# Patient Record
Sex: Female | Born: 1970 | Race: White | Hispanic: No | Smoking: Former smoker
Health system: Southern US, Community
[De-identification: ages and names within clinical notes are randomized; demographics above are authoritative.]

## PROBLEM LIST (undated history)

## (undated) DIAGNOSIS — F329 Major depressive disorder, single episode, unspecified: Secondary | ICD-10-CM

## (undated) DIAGNOSIS — K219 Gastro-esophageal reflux disease without esophagitis: Secondary | ICD-10-CM

## (undated) DIAGNOSIS — F419 Anxiety disorder, unspecified: Secondary | ICD-10-CM

## (undated) DIAGNOSIS — F32A Depression, unspecified: Secondary | ICD-10-CM

## (undated) HISTORY — PX: KNEE SURGERY: SHX244

## (undated) HISTORY — PX: ENDOMETRIAL ABLATION: SHX621

## (undated) HISTORY — PX: TUBAL LIGATION: SHX77

## (undated) HISTORY — PX: TONSILLECTOMY: SUR1361

---

## 2005-05-15 ENCOUNTER — Emergency Department: Payer: Self-pay | Admitting: Unknown Physician Specialty

## 2007-01-11 ENCOUNTER — Emergency Department: Payer: Self-pay | Admitting: Unknown Physician Specialty

## 2007-01-28 ENCOUNTER — Emergency Department: Payer: Self-pay

## 2007-04-19 ENCOUNTER — Emergency Department: Payer: Self-pay | Admitting: Emergency Medicine

## 2007-07-22 ENCOUNTER — Emergency Department: Payer: Self-pay | Admitting: Internal Medicine

## 2008-05-01 ENCOUNTER — Ambulatory Visit: Payer: Self-pay

## 2008-05-27 ENCOUNTER — Emergency Department: Payer: Self-pay | Admitting: Emergency Medicine

## 2008-05-30 ENCOUNTER — Encounter: Payer: Self-pay | Admitting: Orthopedic Surgery

## 2008-06-20 ENCOUNTER — Ambulatory Visit: Payer: Self-pay

## 2008-06-22 ENCOUNTER — Encounter: Payer: Self-pay | Admitting: Orthopedic Surgery

## 2008-07-23 ENCOUNTER — Ambulatory Visit: Payer: Self-pay

## 2009-02-20 DIAGNOSIS — F418 Other specified anxiety disorders: Secondary | ICD-10-CM | POA: Insufficient documentation

## 2009-05-19 ENCOUNTER — Emergency Department (HOSPITAL_COMMUNITY): Admission: EM | Admit: 2009-05-19 | Discharge: 2009-05-19 | Payer: Self-pay | Admitting: Emergency Medicine

## 2010-09-24 LAB — CBC
MCV: 86.8 fL (ref 78.0–100.0)
RBC: 4.38 MIL/uL (ref 3.87–5.11)
WBC: 8.6 10*3/uL (ref 4.0–10.5)

## 2010-09-24 LAB — DIFFERENTIAL
Eosinophils Absolute: 0.2 10*3/uL (ref 0.0–0.7)
Lymphs Abs: 1.5 10*3/uL (ref 0.7–4.0)
Monocytes Relative: 8 % (ref 3–12)
Neutro Abs: 6.1 10*3/uL (ref 1.7–7.7)
Neutrophils Relative %: 71 % (ref 43–77)

## 2010-09-24 LAB — BRAIN NATRIURETIC PEPTIDE: Pro B Natriuretic peptide (BNP): 34 pg/mL (ref 0.0–100.0)

## 2010-09-24 LAB — POCT CARDIAC MARKERS
CKMB, poc: <1 ng/mL — ABNORMAL LOW (ref 1.0–8.0)
Myoglobin, poc: 34.1 ng/mL (ref 12–200)
Troponin i, poc: <0.05 ng/mL (ref 0.00–0.09)

## 2010-09-24 LAB — BASIC METABOLIC PANEL
Chloride: 104 mEq/L (ref 96–112)
Creatinine, Ser: 0.61 mg/dL (ref 0.4–1.2)
GFR calc Af Amer: >60 mL/min (ref >60)
Potassium: 4.6 mEq/L (ref 3.5–5.1)

## 2010-09-24 LAB — URINALYSIS, ROUTINE W REFLEX MICROSCOPIC
Glucose, UA: NEGATIVE mg/dL
Protein, ur: NEGATIVE mg/dL
Specific Gravity, Urine: 1.017 (ref 1.005–1.030)
pH: 6 (ref 5.0–8.0)

## 2010-10-14 ENCOUNTER — Ambulatory Visit: Payer: Self-pay

## 2011-10-29 ENCOUNTER — Emergency Department: Payer: Self-pay | Admitting: Emergency Medicine

## 2011-11-11 ENCOUNTER — Ambulatory Visit: Payer: Self-pay

## 2011-11-16 ENCOUNTER — Ambulatory Visit: Payer: Self-pay

## 2012-06-22 HISTORY — PX: BREAST BIOPSY: SHX20

## 2014-01-16 ENCOUNTER — Inpatient Hospital Stay: Payer: Self-pay | Admitting: Internal Medicine

## 2014-01-16 LAB — COMPREHENSIVE METABOLIC PANEL
ALK PHOS: 74 U/L
ALT: 43 U/L
AST: 26 U/L (ref 15–37)
Albumin: 3.5 g/dL (ref 3.4–5.0)
Anion Gap: 9 (ref 7–16)
BUN: 11 mg/dL (ref 7–18)
Bilirubin,Total: 0.4 mg/dL (ref 0.2–1.0)
CALCIUM: 9.6 mg/dL (ref 8.5–10.1)
CHLORIDE: 109 mmol/L — AB (ref 98–107)
CREATININE: 0.69 mg/dL (ref 0.60–1.30)
Co2: 23 mmol/L (ref 21–32)
EGFR (African American): >60
EGFR (Non-African Amer.): >60
Glucose: 102 mg/dL — ABNORMAL HIGH (ref 65–99)
OSMOLALITY: 281 (ref 275–301)
POTASSIUM: 3.7 mmol/L (ref 3.5–5.1)
Sodium: 141 mmol/L (ref 136–145)
Total Protein: 7.6 g/dL (ref 6.4–8.2)

## 2014-01-16 LAB — CBC WITH DIFFERENTIAL/PLATELET
BASOS ABS: 0.1 10*3/uL (ref 0.0–0.1)
Basophil %: 0.8 %
EOS PCT: 1.2 %
Eosinophil #: 0.1 10*3/uL (ref 0.0–0.7)
HCT: 43.8 % (ref 35.0–47.0)
HGB: 14.4 g/dL (ref 12.0–16.0)
Lymphocyte #: 3 10*3/uL (ref 1.0–3.6)
Lymphocyte %: 24.1 %
MCH: 29.1 pg (ref 26.0–34.0)
MCHC: 32.9 g/dL (ref 32.0–36.0)
MCV: 89 fL (ref 80–100)
MONOS PCT: 6.4 %
Monocyte #: 0.8 x10 3/mm (ref 0.2–0.9)
NEUTROS ABS: 8.4 10*3/uL — AB (ref 1.4–6.5)
Neutrophil %: 67.5 %
PLATELETS: 254 10*3/uL (ref 150–440)
RBC: 4.95 10*6/uL (ref 3.80–5.20)
RDW: 15 % — ABNORMAL HIGH (ref 11.5–14.5)
WBC: 12.4 10*3/uL — ABNORMAL HIGH (ref 3.6–11.0)

## 2014-01-17 LAB — BASIC METABOLIC PANEL
ANION GAP: 9 (ref 7–16)
BUN: 12 mg/dL (ref 7–18)
CALCIUM: 8.9 mg/dL (ref 8.5–10.1)
CO2: 22 mmol/L (ref 21–32)
CREATININE: 0.84 mg/dL (ref 0.60–1.30)
Chloride: 107 mmol/L (ref 98–107)
Glucose: 166 mg/dL — ABNORMAL HIGH (ref 65–99)
OSMOLALITY: 279 (ref 275–301)
POTASSIUM: 3.9 mmol/L (ref 3.5–5.1)
Sodium: 138 mmol/L (ref 136–145)

## 2014-01-17 LAB — CBC WITH DIFFERENTIAL/PLATELET
BASOS PCT: 0.1 %
Basophil #: 0 10*3/uL (ref 0.0–0.1)
Eosinophil #: 0 10*3/uL (ref 0.0–0.7)
Eosinophil %: 0 %
HCT: 40.7 % (ref 35.0–47.0)
HGB: 13.6 g/dL (ref 12.0–16.0)
LYMPHS PCT: 7.6 %
Lymphocyte #: 1.1 10*3/uL (ref 1.0–3.6)
MCH: 29.3 pg (ref 26.0–34.0)
MCHC: 33.3 g/dL (ref 32.0–36.0)
MCV: 88 fL (ref 80–100)
MONO ABS: 0.2 x10 3/mm (ref 0.2–0.9)
MONOS PCT: 1.6 %
NEUTROS ABS: 13 10*3/uL — AB (ref 1.4–6.5)
Neutrophil %: 90.7 %
Platelet: 229 10*3/uL (ref 150–440)
RBC: 4.63 10*6/uL (ref 3.80–5.20)
RDW: 14.6 % — AB (ref 11.5–14.5)
WBC: 14.4 10*3/uL — AB (ref 3.6–11.0)

## 2014-02-03 ENCOUNTER — Emergency Department: Payer: Self-pay | Admitting: Emergency Medicine

## 2014-02-03 LAB — CBC WITH DIFFERENTIAL/PLATELET
Basophil #: 0.2 10*3/uL — ABNORMAL HIGH (ref 0.0–0.1)
Basophil %: 1.1 %
Eosinophil #: 0.1 10*3/uL (ref 0.0–0.7)
Eosinophil %: 0.5 %
HCT: 45.4 % (ref 35.0–47.0)
HGB: 14.5 g/dL (ref 12.0–16.0)
Lymphocyte #: 2.8 10*3/uL (ref 1.0–3.6)
Lymphocyte %: 17.5 %
MCH: 28.8 pg (ref 26.0–34.0)
MCHC: 31.9 g/dL — ABNORMAL LOW (ref 32.0–36.0)
MCV: 90 fL (ref 80–100)
Monocyte #: 1.1 x10 3/mm — ABNORMAL HIGH (ref 0.2–0.9)
Monocyte %: 6.9 %
Neutrophil #: 11.8 10*3/uL — ABNORMAL HIGH (ref 1.4–6.5)
Neutrophil %: 74 %
Platelet: 255 10*3/uL (ref 150–440)
RBC: 5.02 10*6/uL (ref 3.80–5.20)
RDW: 15.9 % — ABNORMAL HIGH (ref 11.5–14.5)
WBC: 16 10*3/uL — ABNORMAL HIGH (ref 3.6–11.0)

## 2014-02-03 LAB — COMPREHENSIVE METABOLIC PANEL
ANION GAP: 9 (ref 7–16)
Albumin: 3.1 g/dL — ABNORMAL LOW (ref 3.4–5.0)
Alkaline Phosphatase: 66 U/L
BUN: 24 mg/dL — AB (ref 7–18)
Bilirubin,Total: 0.2 mg/dL (ref 0.2–1.0)
CALCIUM: 8.6 mg/dL (ref 8.5–10.1)
CO2: 25 mmol/L (ref 21–32)
Chloride: 107 mmol/L (ref 98–107)
Creatinine: 0.81 mg/dL (ref 0.60–1.30)
Glucose: 115 mg/dL — ABNORMAL HIGH (ref 65–99)
Osmolality: 286 (ref 275–301)
POTASSIUM: 4.4 mmol/L (ref 3.5–5.1)
SGOT(AST): 17 U/L (ref 15–37)
SGPT (ALT): 24 U/L
SODIUM: 141 mmol/L (ref 136–145)
Total Protein: 7.5 g/dL (ref 6.4–8.2)

## 2014-02-03 LAB — TROPONIN I

## 2014-02-15 ENCOUNTER — Ambulatory Visit: Payer: Self-pay | Admitting: Family Medicine

## 2014-04-24 ENCOUNTER — Encounter: Payer: Self-pay | Admitting: Family Medicine

## 2014-05-22 ENCOUNTER — Encounter: Payer: Self-pay | Admitting: Family Medicine

## 2014-06-22 ENCOUNTER — Encounter: Payer: Self-pay | Admitting: Family Medicine

## 2014-07-23 ENCOUNTER — Encounter: Payer: Self-pay | Admitting: Family Medicine

## 2014-08-21 ENCOUNTER — Encounter
Admit: 2014-08-21 | Disposition: A | Discharge status: Home / Self Care | Payer: Self-pay | Attending: Family Medicine | Admitting: Family Medicine

## 2014-08-29 ENCOUNTER — Ambulatory Visit: Payer: Self-pay

## 2014-09-21 ENCOUNTER — Encounter
Admit: 2014-09-21 | Disposition: A | Discharge status: Home / Self Care | Payer: Self-pay | Attending: Family Medicine | Admitting: Family Medicine

## 2014-10-13 NOTE — H&P (Signed)
PATIENT NAME:  Brittany Cobb, Brittany Cobb MR#:  [id number] DATE OF BIRTH:  28-Jun-1970  DATE OF ADMISSION:  01/16/2014  PRIMARY CARE PHYSICIAN: Dr. Kellie Moor.   CHIEF COMPLAINT: Shortness of breath.   HISTORY OF PRESENT ILLNESS: A 44 year old female with past history, and remote, has asthma, but not requiring any medications or inhalers since many years. Today, in the morning, was cleaning her bathroom with Clorox, and after cleaning it, having some fumes, she started having severe shortness of breath. She tried to sit under the fan to get some fresh air, but it did not help much, and she was feeling like, as if, she would pass out, so finally called the EMS, came over here. Had wheezing, given respiratory treatment and oxygen supplementation, and she felt better after that, and the hospitalist service was contacted for further evaluation and management of this patient's condition.   REVIEW OF SYSTEMS:  CONSTITUTIONAL: Negative for fever, fatigue, weakness, pain or weight loss.  EYES: No blurring, double vision, discharge or redness.  EARS, NOSE, THROAT: No tinnitus, ear pain or hearing loss.  RESPIRATORY: No cough, but had wheezing and shortness of breath today. No sputum production.  CARDIOVASCULAR: No chest pain, orthopnea, edema, arrhythmia, palpitations.  GASTROINTESTINAL: No nausea, vomiting, diarrhea, or abdominal pain. GENITOURINARY: No dysuria, hematuria, or increased frequency.  ENDOCRINE: No heat or cold intolerance,. SKIN: No acne, rashes, or lesions. MUSCULOSKELETAL: No pain or swelling in the joints.  NEUROLOGIC: No numbness, weakness, tremor or vertigo.  PSYCHIATRIC: No anxiety, insomnia, or bipolar disorder.   PAST MEDICAL HISTORY: Anxiety disorder, chronic back pain, and gastroesophageal reflux disease.   PAST SURGICAL HISTORY: 1. Endometrial ablation surgery.  2. Tonsillectomy.  3. Tubal ligation.  4. Left knee arthroscopic procedure.   SOCIAL HISTORY: She smoked half-a-pack of  cigarettes every day for almost 25 to 30 years, since her age of 67 years. She denies doing any illegal drug use and she drinks alcohol very rarely. She is a [employer] fund[employer].   FAMILY HISTORY: Both her grandparents had heart issues, and on her mother's side, her mother and grandmother both had diabetes and high cholesterol.   HOME MEDICATIONS:  1. Omeprazole 40 mg oral once a day.  2. Norco tablet 325/10 mg every 6 hours as needed for pain.  3. Alprazolam 0.5 mg oral tablet every 6 hours and at bedtime as needed for anxiety.   PHYSICAL EXAMINATION:  VITAL SIGNS: In the ER, temperature 99.3, pulse is 107 on presentation, currently came down to 90, blood pressure 107/86 now came up to 138/67, pulse oximetry 98% to 99% on room air.  GENERAL: The patient is fully alert and oriented to time, place, and person. She is obese, does not appear in any acute distress.  HEENT: Head and neck atraumatic. Conjunctivae pink. Oral mucosa moist.  NECK: Supple. No JVD.  RESPIRATORY: Bilateral equal air entry. Some rhonchi present.  CARDIOVASCULAR: S1, S2 present. No chest tenderness, regular rhythm. No murmur.  ABDOMEN: Soft, nontender, bowel sounds present. No guarding, no rebound.  SKIN: No rashes. EXTREMITIES: Legs, no edema. NEUROLOGICAL: Power 5/5, no tremor or rigidity. Follows commands. Moves all 4 limbs.  PSYCHIATRIC: Does not appear in any acute psychiatric illness at this time.   IMPORTANT LABORATORY RESULTS: Glucose 102, BUN 11, creatinine 0.69, sodium 141, potassium is 3.7, chloride is 109, CO2 is 23, and calcium is 9.6. Total protein is 7.6, albumin 3.5, bilirubin 0.4, alkaline phosphatase 74, SGOT 26, SGPT 43. WBC 12.4, hemoglobin 14.4, platelet  count 254,000, MCV is 89.   On ABG, pH is 7.42, pCO2 is 35, pO2 is 71, and FiO2 is 28.   X-ray of the chest, PA and lateral, no acute cardiopulmonary abnormality.   ASSESSMENT AND PLAN: A 44 year old female with having remote history of asthma and  current smoker, came to the hospital after having severe shortness of breath that occurred after cleaning the bathroom with Clorox, having those fumes inhaled.   1. Acute asthma exacerbation. We will give her IV Solu-Medrol and give her a DuoNeb nebulizer treatment. As she did not have any episode since a long time, she does not need any maintenance therapy at this time.  2. Headache. Has complained of headache after having this episode. We will give Fioricet tablet once. 3. Complaint of nausea after this episode. We will give Zofran once.  4. Anxiety episodes. She is on Xanax, as needed basis, at home. We will continue the same.  5. Gastroesophageal reflux disease, taking omeprazole. We will continue pantoprazole here in the hospital.  6. Smoking, tobacco abuse. Smoking cessation counseling was done for 4 minutes. She agreed to try quitting smoking. She does not need a nicotine patch at this time.   TOTAL TIME SPENT ON ADMISSION: 50 minutes.    ____________________________ Hope PigeonVaibhavkumar G. Elisabeth PigeonVachhani, MD [id number] D: 01/16/2014 16:48:25 ET T: 01/16/2014 17:05:40 ET JOB#: [id number]  cc: Hope PigeonVaibhavkumar G. Elisabeth PigeonVachhani, MD, <Dictator> Kellie MoorJohn Holt, M.D. Heath GoldVAIBHAVKUMAR [facility] MD ELECTRONICALLY SIGNED 01/29/2014 8:15

## 2014-10-13 NOTE — Discharge Summary (Signed)
Dates of Admission and Diagnosis:  Date of Admission 16-Jan-2014   Date of Discharge 19-Jan-2014   Admitting Diagnosis asthma exacerbation   Final Diagnosis asthma exacerbation - after inhaling fumes from toilet cleaner Anxiety Migrain headache nausea Smoking    Chief Complaint/History of Present Illness A 44 year old female with past history, and remote, has asthma, but not requiring any medications or inhalers since many years. Today, in the morning, was cleaning her bathroom with Clorox, and after cleaning it, having some fumes, she started having severe shortness of breath. She tried to sit under the fan to get some fresh air, but it did not help much, and she was feeling like, as if, she would pass out, so finally called the EMS, came over here. Had wheezing, given respiratory treatment and oxygen supplementation, and she felt better after that, and the hospitalist service was contacted for further evaluation and management of this patient's condition.   Allergies:  Bactrim: Hives  Hepatic:  28-Jul-15 13:55   Bilirubin, Total 0.4  Alkaline Phosphatase 74 (46-116 NOTE: New Reference Range 01/09/14)  SGPT (ALT) 43 (14-63 NOTE: New Reference Range 01/09/14)  SGOT (AST) 26  Total Protein, Serum 7.6  Albumin, Serum 3.5  Lab:  28-Jul-15 14:15   pH (ABG) 7.42 (7.35-7.45 NOTE: New Reference Range 01/13/14)  PCO2 35 (32-48 NOTE: New Reference Range 01/13/14)  PO2  71 (83-108 NOTE: New Reference Range 01/13/14)  FiO2 28  Base Excess -1.3 (-2.0-3.0 NOTE: New Reference Range 01/13/14)  HCO3 22.7 (21-28 NOTE: New Reference Range 01/13/14)  O2 Saturation 97.1  O2 Device [place]  Specimen Site (ABG) LT RADIAL  Specimen Type (ABG) ARTERIAL  Patient Temp (ABG) 37.0 (Result(s) reported on 16 Jan 2014 at 02:26PM.)  Cardiology:  28-Jul-15 13:55   Ventricular Rate 110  Atrial Rate 110  P-R Interval 130  QRS Duration 86  QT 352  QTc 476  P Axis 58  R Axis 33  T Axis 41  ECG  interpretation Sinus tachycardia Otherwise normal ECG When compared with ECG of 27-May-2008 06:23, No significant change was found ----------unconfirmed---------- Confirmed by OVERREAD, NOT ([id number]), editor PEARSON, BARBARA ([id number]) on 01/17/2014 12:59:31 PM  Routine Chem:  28-Jul-15 13:55   Glucose, Serum  102  BUN 11  Creatinine (comp) 0.69  Sodium, Serum 141  Potassium, Serum 3.7  Chloride, Serum  109  CO2, Serum 23  Calcium (Total), Serum 9.6  Anion Gap 9  Osmolality (calc) 281  eGFR (African American) >60  eGFR (Non-African American) >60 (eGFR values <59m/min/1.73 m2 may be an indication of chronic kidney disease (CKD). Calculated eGFR is useful in patients with stable renal function. The eGFR calculation will not be reliable in acutely ill patients when serum creatinine is changing rapidly. It is not useful in  patients on dialysis. The eGFR calculation may not be applicable to patients at the low and high extremes of body sizes, pregnant women, and vegetarians.)  29-Jul-15 04:54   Glucose, Serum  166  BUN 12  Creatinine (comp) 0.84  Sodium, Serum 138  Potassium, Serum 3.9  Chloride, Serum 107  CO2, Serum 22  Calcium (Total), Serum 8.9  Anion Gap 9  Osmolality (calc) 279  eGFR (African American) >60  eGFR (Non-African American) >60 (eGFR values <628mmin/1.73 m2 may be an indication of chronic kidney disease (CKD). Calculated eGFR is useful in patients with stable renal function. The eGFR calculation will not be reliable in acutely ill patients when serum creatinine is changing rapidly. It is not useful  in  patients on dialysis. The eGFR calculation may not be applicable to patients at the low and high extremes of body sizes, pregnant women, and vegetarians.)  Routine Hem:  28-Jul-15 13:55   WBC (CBC)  12.4  RBC (CBC) 4.95  Hemoglobin (CBC) 14.4  Hematocrit (CBC) 43.8  Platelet Count (CBC) 254  MCV 89  MCH 29.1  MCHC 32.9  RDW  15.0  Neutrophil % 67.5   Lymphocyte % 24.1  Monocyte % 6.4  Eosinophil % 1.2  Basophil % 0.8  Neutrophil #  8.4  Lymphocyte # 3.0  Monocyte # 0.8  Eosinophil # 0.1  Basophil # 0.1 (Result(s) reported on 16 Jan 2014 at 03:24PM.)  29-Jul-15 04:54   WBC (CBC)  14.4  RBC (CBC) 4.63  Hemoglobin (CBC) 13.6  Hematocrit (CBC) 40.7  Platelet Count (CBC) 229  MCV 88  MCH 29.3  MCHC 33.3  RDW  14.6  Neutrophil % 90.7  Lymphocyte % 7.6  Monocyte % 1.6  Eosinophil % 0.0  Basophil % 0.1  Neutrophil #  13.0  Lymphocyte # 1.1  Monocyte # 0.2  Eosinophil # 0.0  Basophil # 0.0 (Result(s) reported on 17 Jan 2014 at 05:55AM.)   PERTINENT RADIOLOGY STUDIES: XRay:    28-Jul-15 14:08, Chest PA and Lateral  Chest PA and Lateral   REASON FOR EXAM:    sob  COMMENTS:   May transport without cardiac monitor    PROCEDURE: DXR - DXR CHEST PA (OR AP) AND LATERAL  - Jan 16 2014  2:08PM     CLINICAL DATA:  Shortness of breath.    EXAM:  CHEST  2 VIEW    COMPARISON:  None.    FINDINGS:  The heart size and mediastinal contours are within normal limits.  Both lungs are clear. No pneumothorax or pleural effusion is noted.  The visualized skeletal structures are unremarkable.     IMPRESSION:  No acute cardiopulmonary abnormality seen.      Electronically Signed    By: Sabino Dick M.D.    On: 01/16/2014 14:12         Verified By: Marveen Reeks, M.D.,    30-Jul-15 11:22, Chest PA and Lateral  Chest PA and Lateral   REASON FOR EXAM:    compare pneumonitis  COMMENTS:       PROCEDURE: DXR - DXR CHEST PA (OR AP) AND LATERAL  - Jan 18 2014 11:22AM     CLINICAL DATA:  Pneumonitis    EXAM:  CHEST  2 VIEW    COMPARISON:  01/17/2014    FINDINGS:  Cardiomediastinal silhouette is stable. No segmental infiltrate or  pulmonary edema. Central mild bronchitic changes. Bony thorax is  unremarkable.     IMPRESSION:  No segmental infiltrate or pulmonary edema. Central mild  bronchitic  changes.      Electronically Signed    By: Lahoma Crocker M.D.    On: 01/18/2014 11:28         Verified By: Ephraim Hamburger, M.D.,  LabUnknown:    28-Jul-15 14:08, Chest PA and Lateral  PACS Image     30-Jul-15 11:22, Chest PA and Lateral  PACS Image    Pertinent Past History:  Pertinent Past History Anxiety disorder, chronic back pain, and gastroesophageal reflux disease.   Hospital Course:  Hospital Course A 44 year old female with having remote history of asthma and current smoker, came to the hospital after having severe shortness of breath that occurred after cleaning the bathroom  with Clorox, having those fumes inhaled.   1. Acute asthma exacerbation. IV Solu-Medrol , DuoNeb nebulizer treatment. does not need any maintenance therapy at home.         added montelukast. humidify oxygen- encouraged to ambulate as she is just staying in bed.        added fluticasone inhaler and swiched to oral steroid- advised to stay off from work for 4-5 days. d/c home today after ambulation. 2. Headache. Has complained of headache after having this episode. given fioricet, not helped much.     give Magnesium IV, Pehnergan, benadryl. give ambien at night - had 3-4 hrs sleep and some relief in headache. 3. Complaint of nausea after this episode. zofran+ phenergan.  4. Anxiety episodes. on Xanax, as needed basis, at home. continue the same.  5. Gastroesophageal reflux disease, taking omeprazole. continue pantoprazole here in the hospital.  6. Smoking, tobacco abuse. Smoking cessation counseling was done for 4 minutes. She agreed to try quitting smoking. She does not need a nicotine patch at this time.   Condition on Discharge Stable   Code Status:  Code Status Full Code   DISCHARGE INSTRUCTIONS HOME MEDS:  Medication Reconciliation: Patient's Home Medications at Discharge:     Medication Instructions  omeprazole 40 mg oral delayed release capsule  1 cap(s) orally once a day    norco 325 mg-10 mg oral tablet  1 tab(s) orally every 6 hours, As Needed    alprazolam 0.25 mg oral tablet  1 tab(s) orally every 8 hours, As Needed, anxiety , As needed, anxiety   prednisone 10 mg oral tablet  Start at 60 mg and taper by 10 mg daily until complete   acetaminophen/butalbital/caffeine 325 mg-50 mg-40 mg oral tablet  1 tab(s) orally every 8 hours, As Needed, headache , As needed, headache   promethazine 12.5 mg oral tablet  1 tab(s) orally every 8 hours, As Needed - for Nausea, Vomiting   zolpidem 5 mg oral tablet  1 tab(s) orally once a day (at bedtime), As Needed for sleep   guaifenesin  10 milliliter(s) orally every 6 hours x 5 days, As Needed, cough , As needed, cough   benzocaine-menthol topical  1 lozenge orally every 2 hours, As Needed, cough, sore throat , As needed, cough, sore throat   montelukast 10 mg oral tablet  1 tab(s) orally once a day   levofloxacin 500 mg oral tablet  1 tab(s) orally once a day x 4 days   fluticasone cfc free 110 mcg/inh inhalation aerosol  1 puff(s) inhaled 2 times a day   nicotine 21 mg/24 hr transdermal film, extended release  1 patch transdermal once a day   albuterol cfc free 90 mcg/inh inhalation aerosol  2 puff(s) inhaled 4 times a day, As Needed - for Shortness of Breath   albuterol 1.25 mg/3 ml (0.042%) inhalation solution  3 milliliter(s) inhaled 3 times a day, As Needed - for Shortness of Breath     Physician's Instructions:  Diet Low Sodium  Low Fat, Low Cholesterol   Activity Limitations No exertional activity  for 4-5 days   Return to Work Not Applicable   Time frame for Follow Up Appointment 1-2 weeks  PMD     BURNS, HARRIET(Family Physician): [phone]  Electronic Signatures: Vaughan Basta (MD)  (Signed 31-Jul-15 14:39)  Authored: ADMISSION DATE AND DIAGNOSIS, CHIEF COMPLAINT/HPI, Allergies, PERTINENT LABS, PERTINENT RADIOLOGY STUDIES, PERTINENT PAST HISTORY, HOSPITAL COURSE, DISCHARGE INSTRUCTIONS HOME  MEDS, PATIENT INSTRUCTIONS, Follow Up  Physician   Last Updated: 31-Jul-15 14:39 by Vaughan Basta (MD)

## 2014-10-22 ENCOUNTER — Ambulatory Visit: Payer: Self-pay

## 2014-10-24 ENCOUNTER — Ambulatory Visit: Payer: Self-pay

## 2014-10-26 ENCOUNTER — Ambulatory Visit: Payer: Self-pay

## 2014-10-29 ENCOUNTER — Ambulatory Visit: Payer: Self-pay

## 2014-10-31 ENCOUNTER — Ambulatory Visit: Payer: Self-pay

## 2014-11-02 ENCOUNTER — Ambulatory Visit: Payer: Self-pay

## 2014-11-05 ENCOUNTER — Ambulatory Visit: Payer: Self-pay

## 2014-11-07 ENCOUNTER — Ambulatory Visit: Payer: Self-pay

## 2014-11-09 ENCOUNTER — Ambulatory Visit: Payer: Self-pay

## 2014-11-12 ENCOUNTER — Ambulatory Visit: Payer: Self-pay

## 2014-11-14 ENCOUNTER — Ambulatory Visit: Payer: Self-pay

## 2014-11-16 ENCOUNTER — Ambulatory Visit: Payer: Self-pay

## 2014-11-21 ENCOUNTER — Ambulatory Visit: Payer: Self-pay

## 2014-11-23 ENCOUNTER — Ambulatory Visit: Payer: Self-pay

## 2014-11-26 ENCOUNTER — Ambulatory Visit: Payer: Self-pay

## 2014-11-28 ENCOUNTER — Ambulatory Visit: Payer: Self-pay

## 2014-11-30 ENCOUNTER — Ambulatory Visit: Payer: Self-pay

## 2014-12-03 ENCOUNTER — Ambulatory Visit: Payer: Self-pay

## 2014-12-05 ENCOUNTER — Ambulatory Visit: Payer: Self-pay

## 2014-12-07 ENCOUNTER — Ambulatory Visit: Payer: Self-pay

## 2014-12-10 ENCOUNTER — Ambulatory Visit: Payer: Self-pay

## 2014-12-12 ENCOUNTER — Ambulatory Visit: Payer: Self-pay

## 2014-12-14 ENCOUNTER — Ambulatory Visit: Payer: Self-pay

## 2014-12-17 ENCOUNTER — Ambulatory Visit: Payer: Self-pay

## 2014-12-19 ENCOUNTER — Ambulatory Visit: Payer: Self-pay

## 2014-12-21 ENCOUNTER — Ambulatory Visit: Payer: Self-pay

## 2015-03-26 ENCOUNTER — Emergency Department
Admission: EM | Admit: 2015-03-26 | Discharge: 2015-03-26 | Disposition: A | Discharge status: Home / Self Care | Payer: Self-pay | Attending: Emergency Medicine | Admitting: Emergency Medicine

## 2015-03-26 ENCOUNTER — Emergency Department: Payer: BLUE CROSS/BLUE SHIELD

## 2015-03-26 ENCOUNTER — Encounter: Payer: Self-pay | Admitting: Intensive Care

## 2015-03-26 DIAGNOSIS — Z720 Tobacco use: Secondary | ICD-10-CM | POA: Insufficient documentation

## 2015-03-26 DIAGNOSIS — S9001XD Contusion of right ankle, subsequent encounter: Secondary | ICD-10-CM | POA: Insufficient documentation

## 2015-03-26 DIAGNOSIS — S9031XD Contusion of right foot, subsequent encounter: Secondary | ICD-10-CM | POA: Insufficient documentation

## 2015-03-26 DIAGNOSIS — W5512XD Struck by horse, subsequent encounter: Secondary | ICD-10-CM | POA: Insufficient documentation

## 2015-03-26 HISTORY — DX: Major depressive disorder, single episode, unspecified: F32.9

## 2015-03-26 HISTORY — DX: Anxiety disorder, unspecified: F41.9

## 2015-03-26 HISTORY — DX: Depression, unspecified: F32.A

## 2015-03-26 HISTORY — DX: Gastro-esophageal reflux disease without esophagitis: K21.9

## 2015-03-26 MED ORDER — NAPROXEN 500 MG PO TABS
500.0000 mg | ORAL_TABLET | Freq: Once | ORAL | Status: AC
  Administered 2015-03-26: 500 mg via ORAL
  Filled 2015-03-26: qty 1

## 2015-03-26 NOTE — ED Provider Notes (Signed)
CSN: [id number]     Arrival date & time 03/26/15  1239 History   First MD Initiated Contact with Patient 03/26/15 1249     Chief Complaint  Patient presents with  . Foot Pain   HPI Comments: 44 year old female presents a complaining of foot pain secondary to injury that occurred 2 months ago. Patient states her horse stepped on her right ankle around that time. She was her primary care doctor who x-rayed her foot at the time of the injury. The x-ray was negative for acute fracture. The pain started again within the past 2 weeks. 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 Does have to get up and down out of the truc and this puts a lot of pressure on her feet. Taking anti-inflammatories without relief.  Patient is a 44 y.o. female presenting with lower extremity pain.  Foot Pain Associated symptoms include arthralgias, joint swelling and myalgias.    Past Medical History  Diagnosis Date  . Acid reflux   . Depression   . Anxiety    Past Surgical History  Procedure Laterality Date  . Knee surgery Left   . Tonsillectomy    . Tubal ligation    . Endometrial ablation     History reviewed. No pertinent family history. Social History  Substance Use Topics  . Smoking status: Current Every Day Smoker -- 0.50 packs/day    Types: Cigarettes  . Smokeless tobacco: Never Used  . Alcohol Use: No   OB History    No data available     Review of Systems  Musculoskeletal: Positive for myalgias, joint swelling and arthralgias.  Skin: Positive for wound.  All other systems reviewed and are negative.     Allergies  Bactrim  Home Medications   Prior to Admission medications   Not on File   BP 126/98 mmHg  Pulse 74  Temp(Src) 98.6 F (37 C) (Oral)  Resp 18  SpO2 98% Physical Exam  Constitutional: She is oriented to person, place, and time. Vital signs are normal. She appears well-developed and well-nourished. She is active.  Non-toxic appearance. She  does not have a sickly appearance. She does not appear ill.  HENT:  Head: Normocephalic and atraumatic.  Cardiovascular: Intact distal pulses.   Musculoskeletal: Normal range of motion. She exhibits tenderness.       Right ankle: She exhibits ecchymosis. She exhibits normal range of motion, no swelling, no deformity, no laceration and normal pulse. Tenderness. Head of 5th metatarsal tenderness found. No lateral malleolus and no medial malleolus tenderness found. Achilles tendon normal.       Left ankle: Normal.  Neurological: She is alert and oriented to person, place, and time.  Skin: Skin is warm and dry.  Ecchymosis noted over fifth metatarsal head  Psychiatric: She has a normal mood and affect. Her behavior is normal. Judgment and thought content normal.  Nursing note and vitals reviewed.   ED Course  Procedures (including critical care time) Labs Review Labs Reviewed - No data to display  Imaging Review Dg Foot Complete Right  03/26/2015   CLINICAL DATA:  Pt was stepped on by horse x 2 weeks ago, right foot pain  EXAM: RIGHT FOOT COMPLETE - 3+ VIEW  COMPARISON:  None.  FINDINGS: There is no evidence of fracture or dislocation. There is no evidence of arthropathy or other focal bone abnormality. Soft tissues are unremarkable.  IMPRESSION: Negative.   Electronically Signed   By: Lanora Manis  Manson Passey M.D.   On: 03/26/2015 13:33   I have personally reviewed and evaluated these images and lab results as part of my medical decision-making.   EKG Interpretation None      MDM  Pt requesting narcotic pain medication. Without acute injury this is not indicated. NCDR reveals she received a 30 day supply of Norco 10 325, #60 pills on 03/21/15 from Dr. Leonor Liv, her primary care physician. Mathews Robinsons, recommended follow up with PCP or ortho-info provided on how to contact, continue OTC meds for pain Final diagnoses:  Foot contusion, right, subsequent encounter        Luvenia Redden,  PA-C 03/26/15 1353  Myrna Blazer, MD 03/26/15 [phone]

## 2015-03-26 NOTE — Discharge Instructions (Signed)
Contusion °A contusion is a deep bruise. Contusions happen when an injury causes bleeding under the skin. Signs of bruising include pain, puffiness (swelling), and discolored skin. The contusion may turn blue, purple, or yellow. °HOME CARE  °· Put ice on the injured area. °¨ Put ice in a plastic bag. °¨ Place a towel between your skin and the bag. °¨ Leave the ice on for 15-20 minutes, 03-04 times a day. °· Only take medicine as told by your doctor. °· Rest the injured area. °· If possible, raise (elevate) the injured area to lessen puffiness. °GET HELP RIGHT AWAY IF:  °· You have more bruising or puffiness. °· You have pain that is getting worse. °· Your puffiness or pain is not helped by medicine. °MAKE SURE YOU:  °· Understand these instructions. °· Will watch your condition. °· Will get help right away if you are not doing well or get worse. °Document Released: 11/25/2007 Document Revised: 08/31/2011 Document Reviewed: 04/13/2011 °[employer]® Patient Information ©2015 [employer]. This information is not intended to replace advice given to you by your health care provider. Make sure you discuss any questions you have with your health care provider. ° °

## 2015-03-26 NOTE — ED Notes (Signed)
Patient reports her horse stepped on her R ankle about two months ago. Foot is now swelling with minimal bruising noted. Patient c/o pain when putting pressure on foot

## 2015-12-03 ENCOUNTER — Other Ambulatory Visit: Payer: Self-pay | Admitting: Family Medicine

## 2015-12-03 DIAGNOSIS — Z1231 Encounter for screening mammogram for malignant neoplasm of breast: Secondary | ICD-10-CM

## 2015-12-18 ENCOUNTER — Ambulatory Visit: Payer: BLUE CROSS/BLUE SHIELD

## 2015-12-27 ENCOUNTER — Ambulatory Visit: Payer: Self-pay

## 2016-01-13 ENCOUNTER — Ambulatory Visit: Payer: BLUE CROSS/BLUE SHIELD

## 2016-01-16 ENCOUNTER — Ambulatory Visit
Admission: RE | Admit: 2016-01-16 | Discharge: 2016-01-16 | Disposition: A | Discharge status: Home / Self Care | Payer: Self-pay | Source: Ambulatory Visit | Attending: Family Medicine | Admitting: Family Medicine

## 2016-01-16 DIAGNOSIS — Z1231 Encounter for screening mammogram for malignant neoplasm of breast: Secondary | ICD-10-CM

## 2016-02-28 DIAGNOSIS — F314 Bipolar disorder, current episode depressed, severe, without psychotic features: Secondary | ICD-10-CM | POA: Insufficient documentation

## 2016-03-02 IMAGING — DX DG CHEST 2V
2 series · 2 of 2 positions shown · non-contrast
Comparison: None.

CLINICAL DATA: Shortness of breath.

EXAM:
CHEST  2 VIEW

[chest pa]
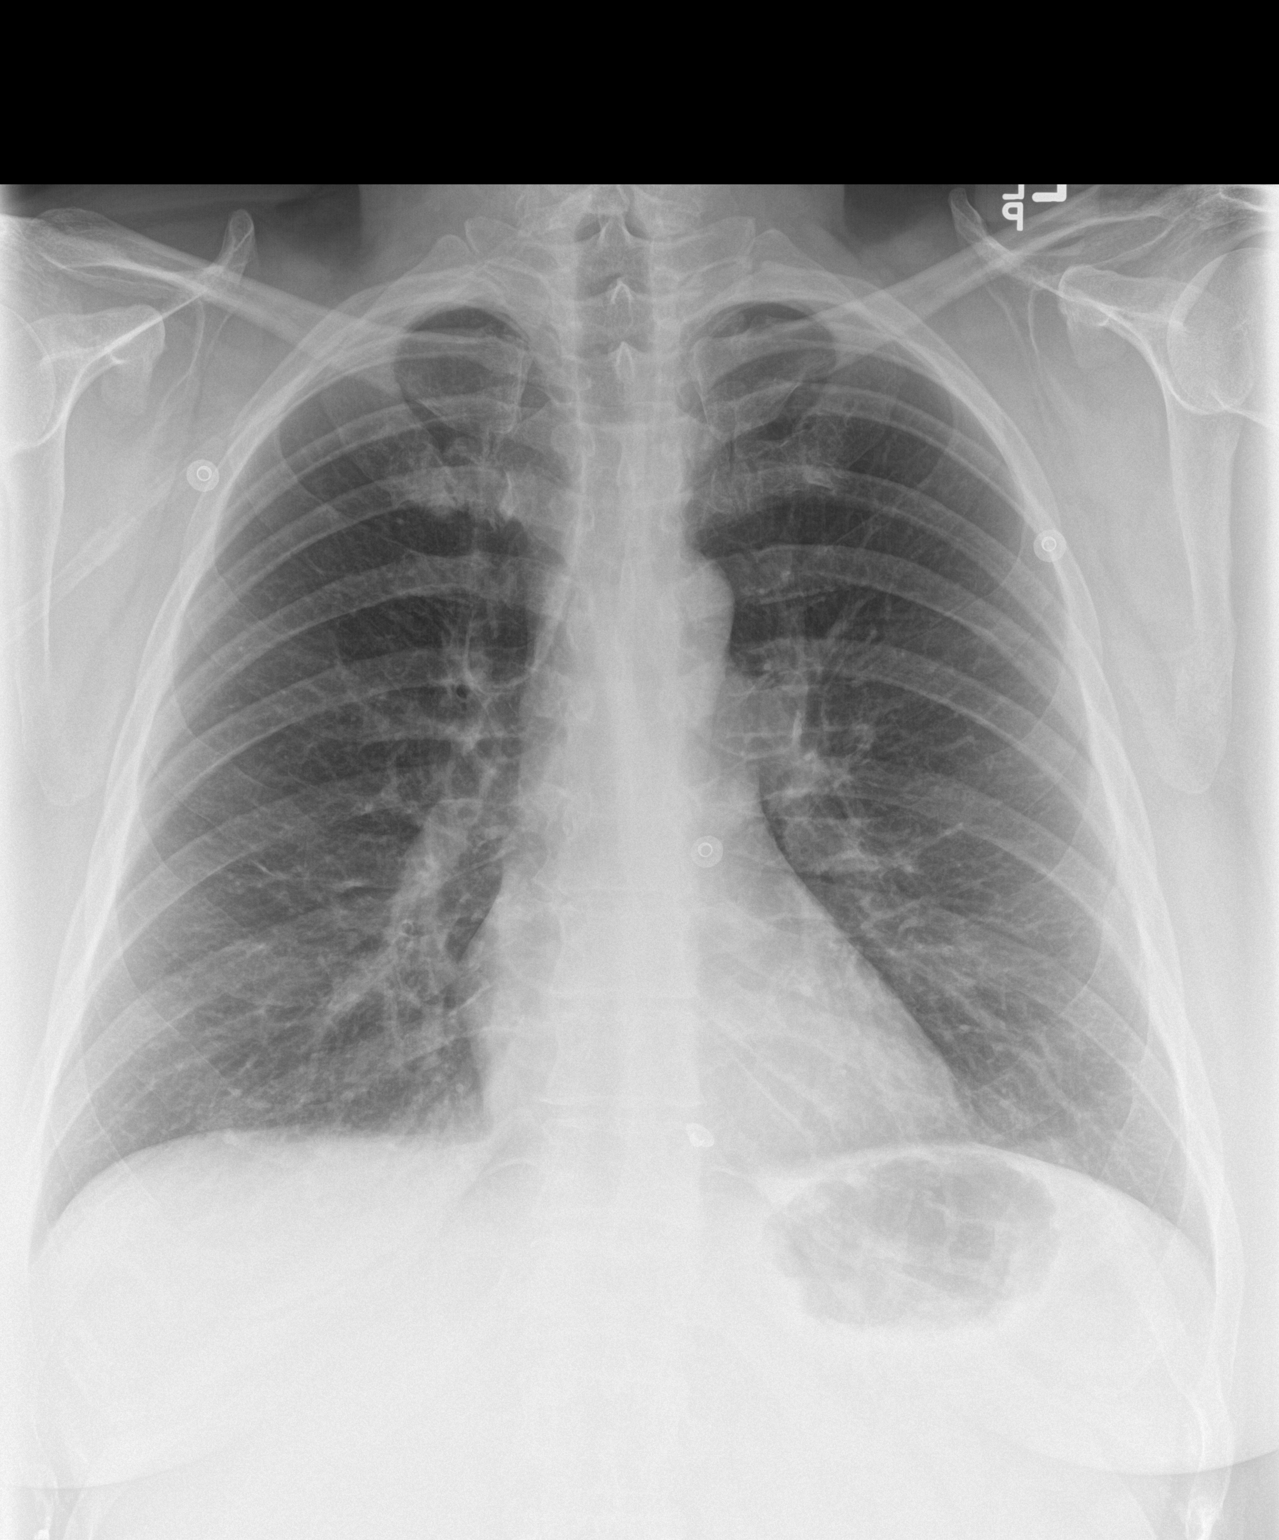

[chest lat]
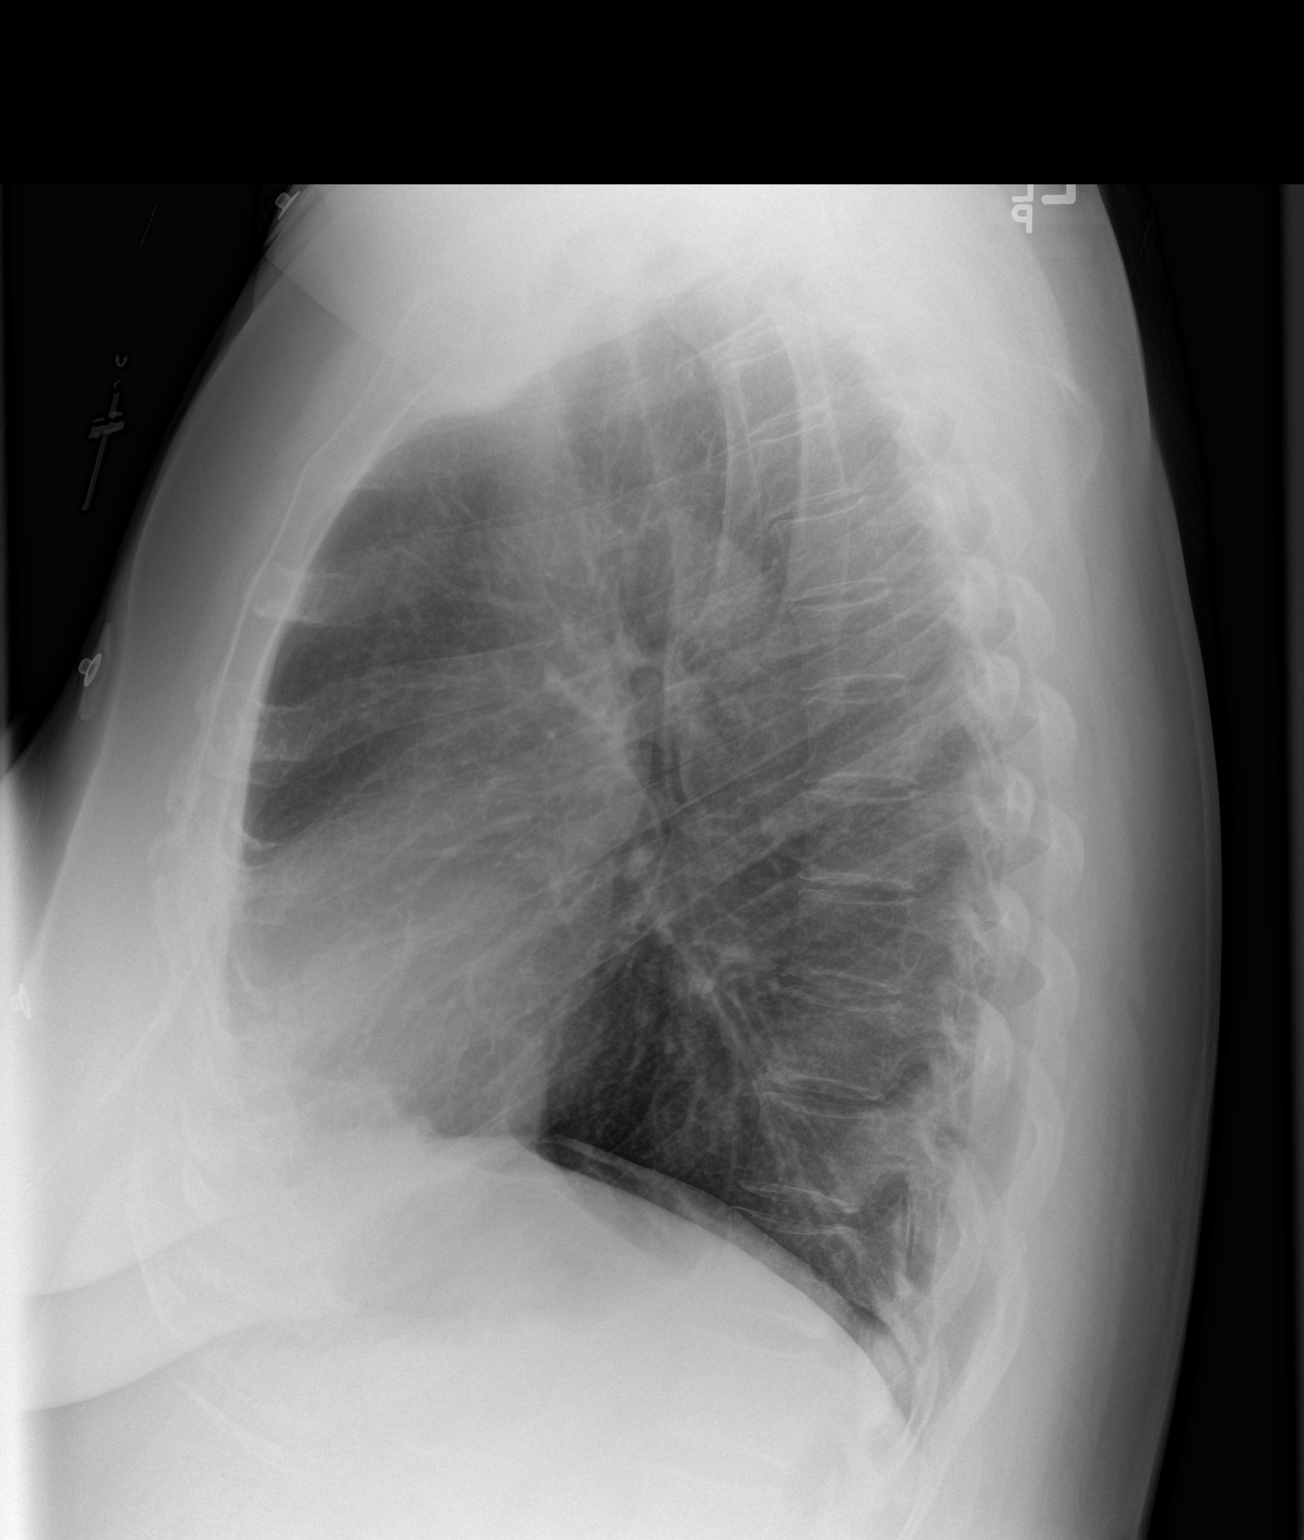

[2 of 2 positions shown; findings below may reference images not displayed]

FINDINGS: The heart size and mediastinal contours are within normal limits.
Both lungs are clear. No pneumothorax or pleural effusion is noted.
The visualized skeletal structures are unremarkable.
IMPRESSION: No acute cardiopulmonary abnormality seen.

## 2016-03-03 IMAGING — CR DG CHEST 2V
1 series · 2 of 2 positions shown · non-contrast
Comparison: 01/16/2014

CLINICAL DATA: Shortness of breath

EXAM:
CHEST  2 VIEW

[Series 1: w chest pa · 0.14mm/px · 2 of 2 slices shown]
[im 1/2]
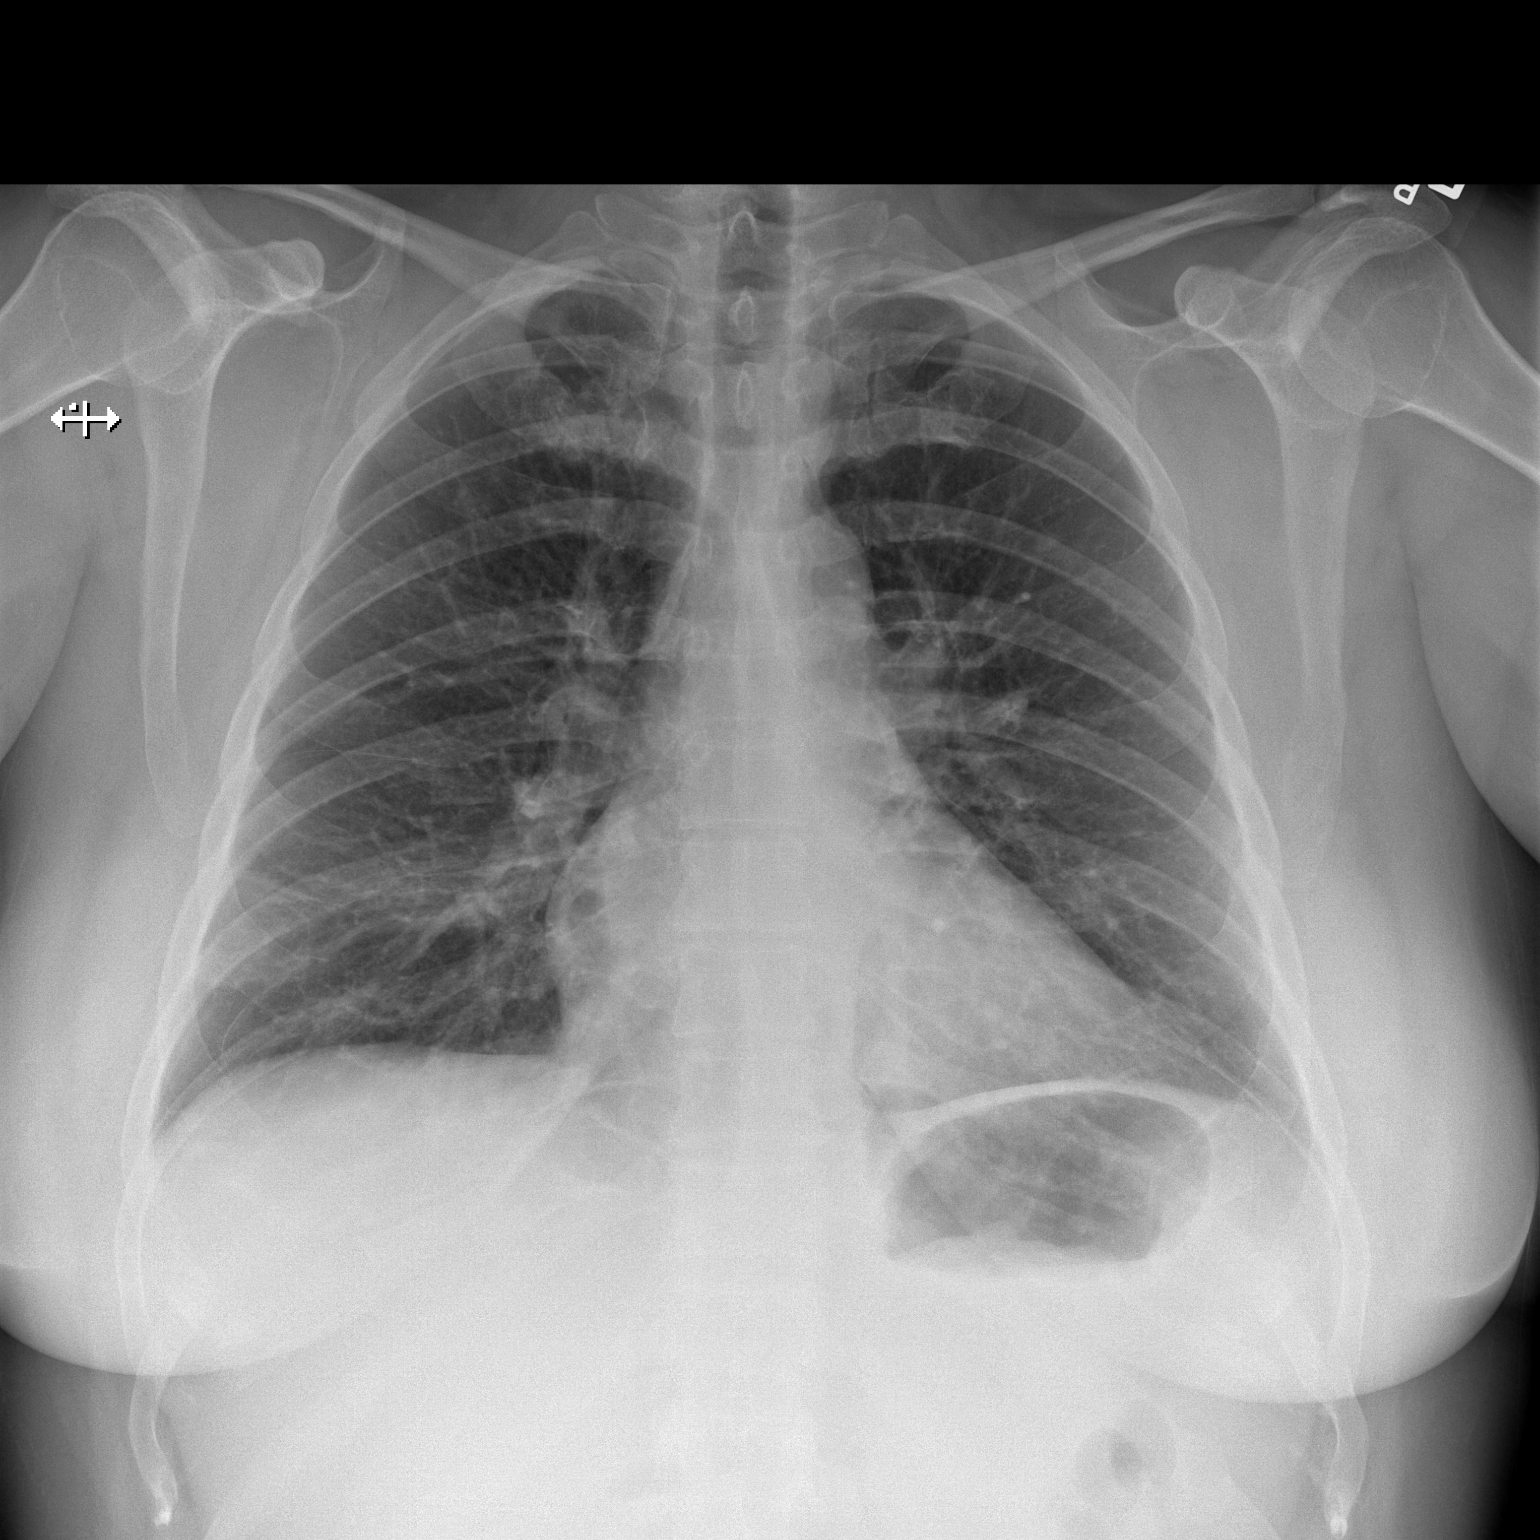
[im 2/2]
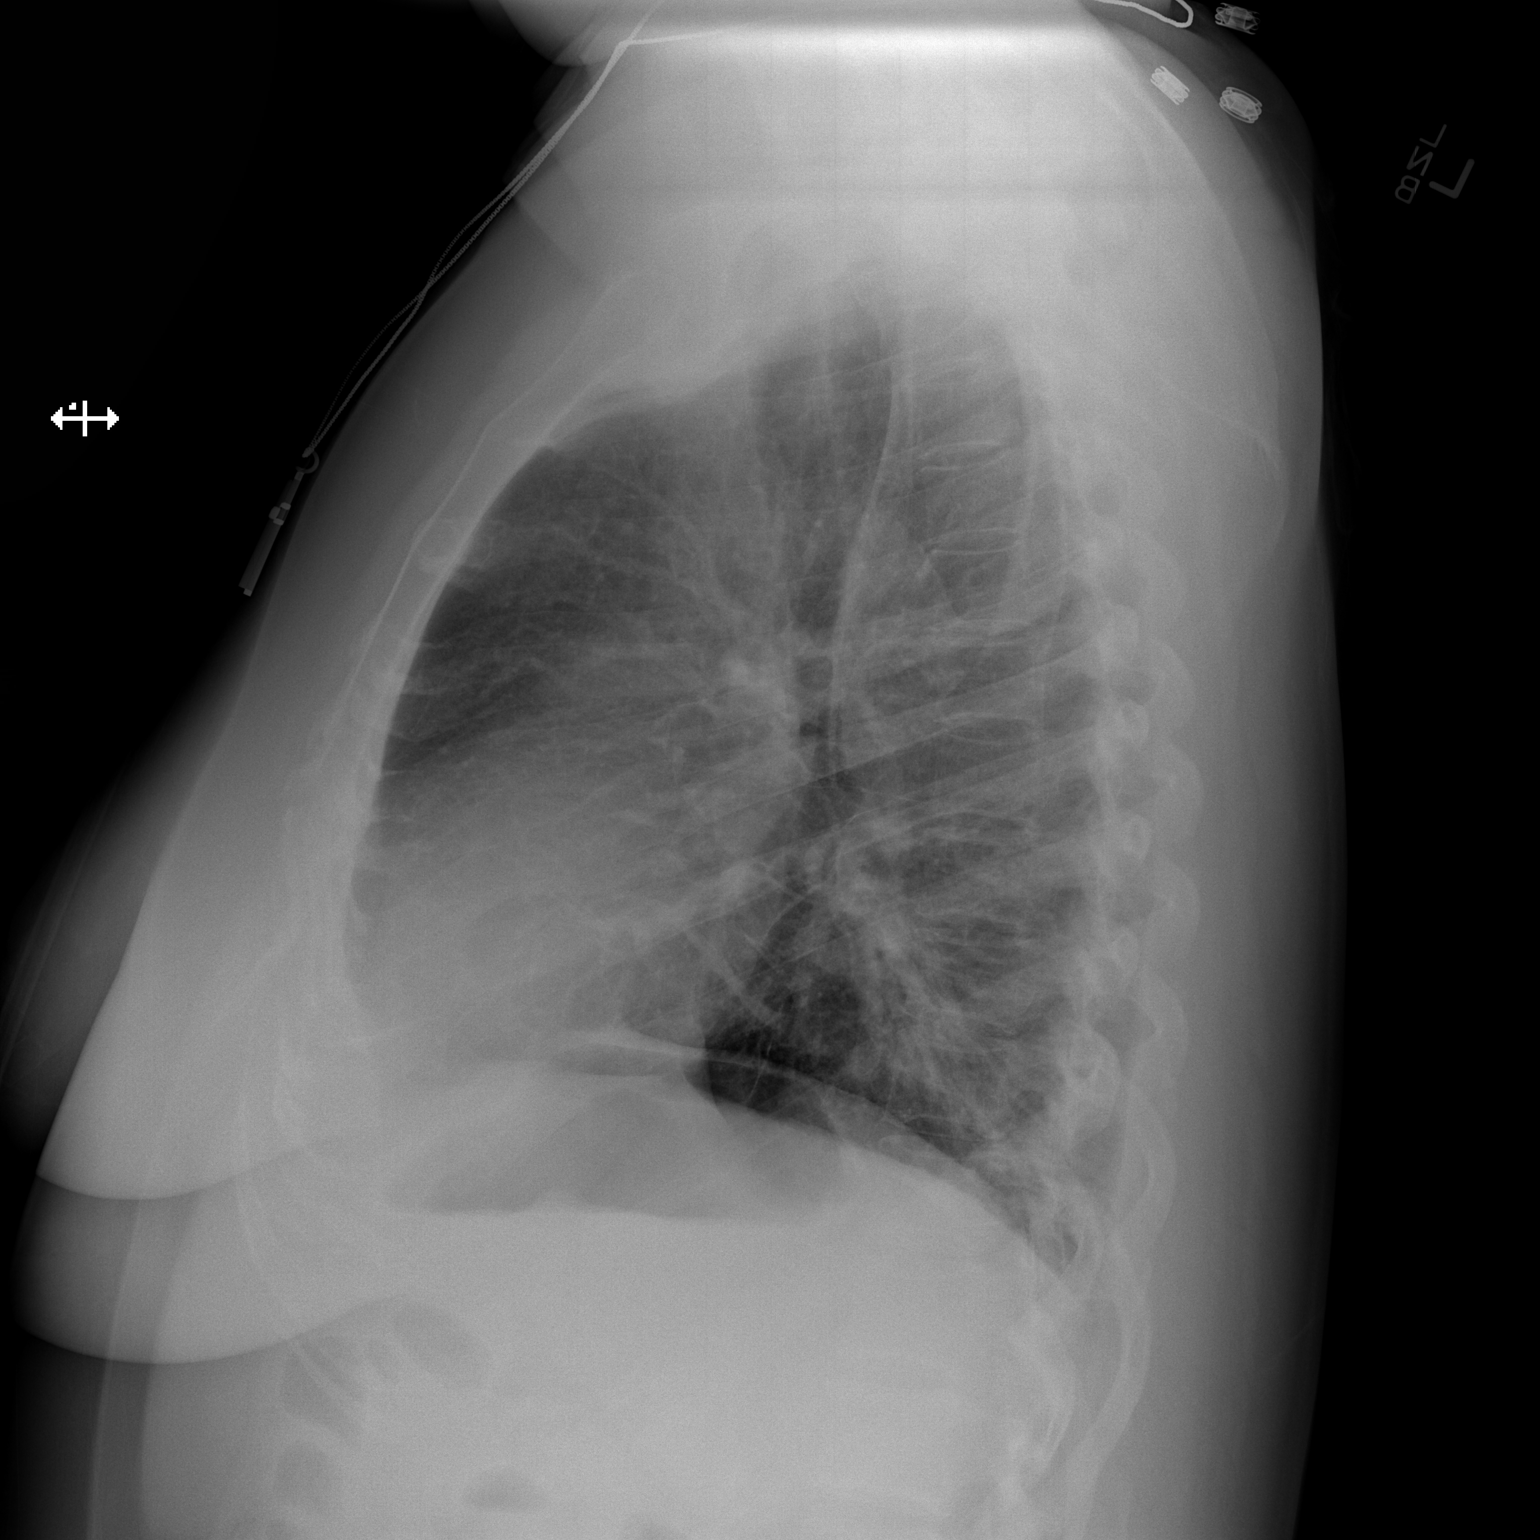

[2 of 2 positions shown; findings below may reference images not displayed]

FINDINGS: There are bilateral chronic bronchitic changes. There is no focal
parenchymal opacity, pleural effusion, or pneumothorax. The heart
and mediastinal contours are unremarkable.

The osseous structures are unremarkable.
IMPRESSION: No active cardiopulmonary disease.

## 2016-03-04 IMAGING — CR DG CHEST 2V
1 series · 2 of 2 positions shown · non-contrast
Comparison: 01/17/2014

CLINICAL DATA: Pneumonitis

EXAM:
CHEST  2 VIEW

[Series 4: w chest pa · 0.14mm/px · 2 of 2 slices shown]
[im 1/2]
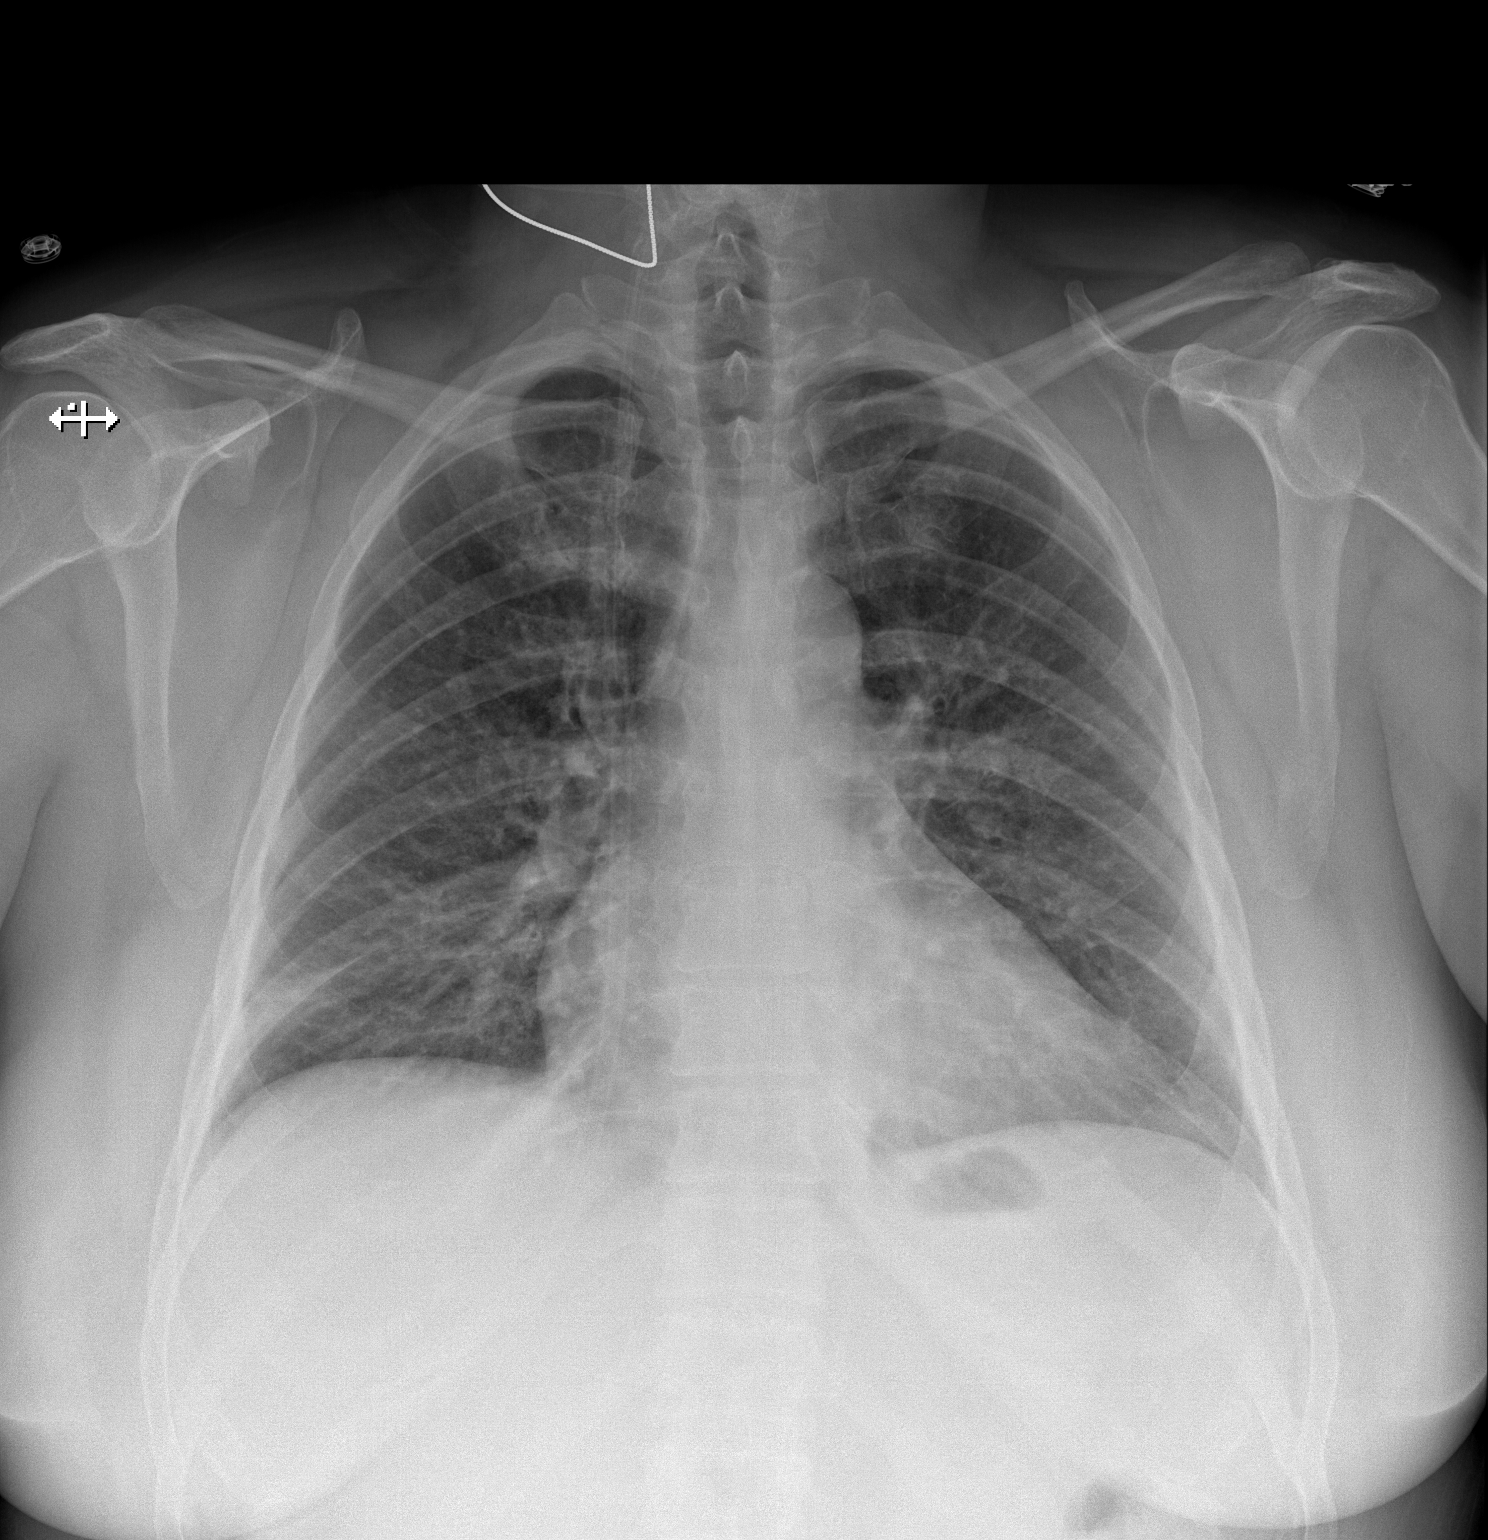
[im 2/2]
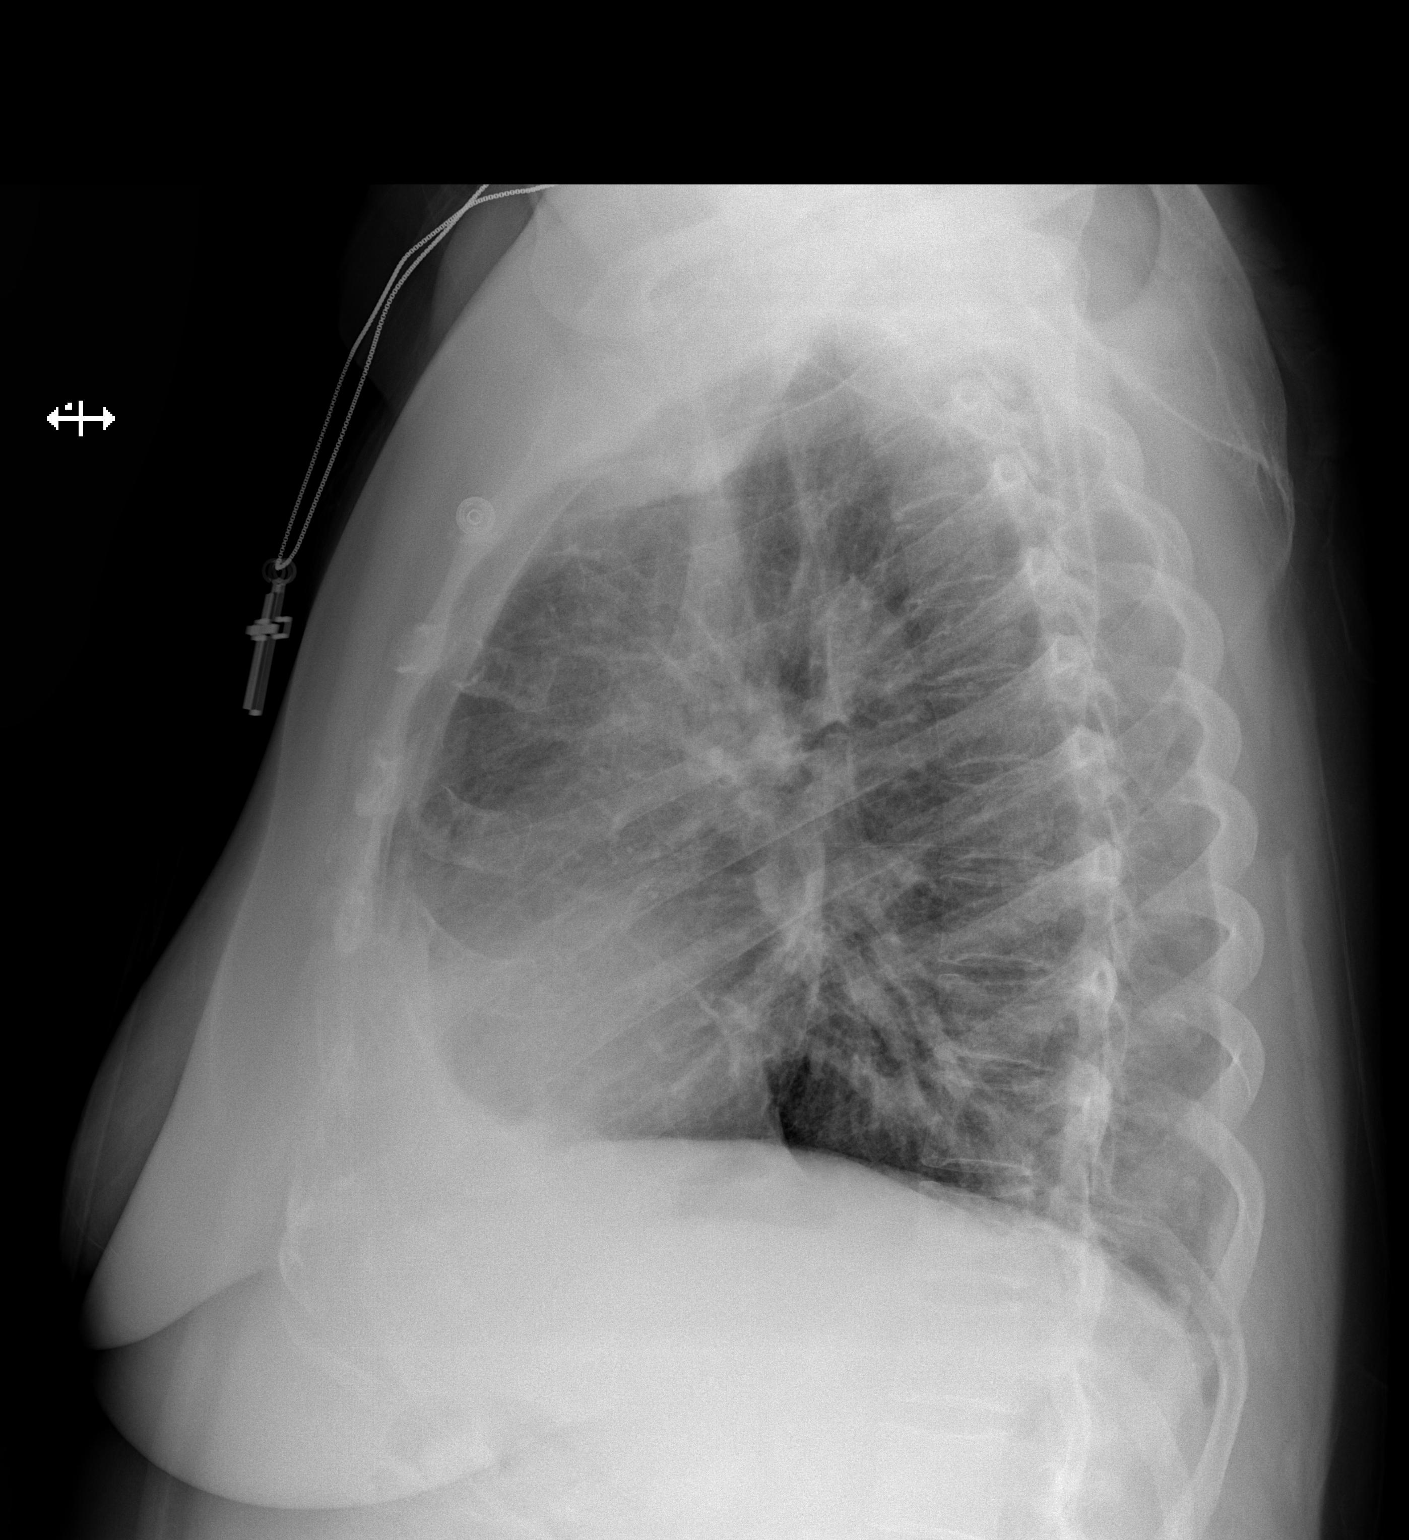

[2 of 2 positions shown; findings below may reference images not displayed]

FINDINGS: Cardiomediastinal silhouette is stable. No segmental infiltrate or
pulmonary edema. Central mild bronchitic changes. Bony thorax is
unremarkable.
IMPRESSION: No segmental infiltrate or pulmonary edema. Central mild bronchitic
changes.

## 2016-11-06 ENCOUNTER — Ambulatory Visit
Admission: EM | Admit: 2016-11-06 | Discharge: 2016-11-06 | Disposition: A | Discharge status: Home / Self Care | Payer: BLUE CROSS/BLUE SHIELD | Attending: Family Medicine | Admitting: Family Medicine

## 2016-11-06 ENCOUNTER — Encounter: Payer: Self-pay | Admitting: *Deleted

## 2016-11-06 DIAGNOSIS — N390 Urinary tract infection, site not specified: Secondary | ICD-10-CM

## 2016-11-06 DIAGNOSIS — R319 Hematuria, unspecified: Secondary | ICD-10-CM

## 2016-11-06 LAB — URINALYSIS, COMPLETE (UACMP) WITH MICROSCOPIC
Glucose, UA: NEGATIVE mg/dL
NITRITE: POSITIVE — AB
PH: 6 (ref 5.0–8.0)
Protein, ur: 100 mg/dL — AB
Specific Gravity, Urine: 1.02 (ref 1.005–1.030)

## 2016-11-06 MED ORDER — NITROFURANTOIN MONOHYD MACRO 100 MG PO CAPS: 100.0000 mg | ORAL_CAPSULE | Freq: Two times a day (BID) | ORAL | 0 refills | Status: DC

## 2016-11-06 MED ORDER — FLUCONAZOLE 150 MG PO TABS: 150.0000 mg | ORAL_TABLET | Freq: Every day | ORAL | 0 refills | Status: DC

## 2016-11-06 NOTE — ED Provider Notes (Signed)
[facility]    CSN: [id number] Arrival date & time: 11/06/16  1138     History   Chief Complaint Chief Complaint  Patient presents with  . Urinary Retention    HPI Brittany Cobb is a 46 y.o. female.   The history is provided by the patient.  Dysuria  Pain quality:  Burning Pain severity:  Moderate Onset quality:  Sudden Duration:  1 day Timing:  Constant Progression:  Worsening Chronicity:  New Recent urinary tract infections: no   Relieved by:  None tried Ineffective treatments:  None tried Urinary symptoms: discolored urine, frequent urination and hesitancy   Associated symptoms: no abdominal pain, no fever, no flank pain, no genital lesions, no nausea, no vaginal discharge and no vomiting   Risk factors: no hx of pyelonephritis, no hx of urolithiasis, no kidney transplant, not pregnant, no recurrent urinary tract infections, no renal cysts, no renal disease, no single kidney and no urinary catheter     Past Medical History:  Diagnosis Date  . Acid reflux   . Anxiety   . Depression     There are no active problems to display for this patient.   Past Surgical History:  Procedure Laterality Date  . BREAST BIOPSY Left 2014   benign with s shaped clip  . ENDOMETRIAL ABLATION    . KNEE SURGERY Left   . TONSILLECTOMY    . TUBAL LIGATION      OB History    No data available       Home Medications    Prior to Admission medications   Medication Sig Start Date End Date Taking? Authorizing Provider  ALPRAZolam Prudy Feeler(XANAX) 0.5 MG tablet Take 0.5 mg by mouth at bedtime as needed for anxiety.   Yes [provider]  hydrochlorothiazide (HYDRODIURIL) 25 MG tablet Take 37.5 mg by mouth daily.   Yes [provider]  HYDROcodone-acetaminophen (NORCO) 10-325 MG tablet Take 1 tablet by mouth every 6 (six) hours as needed.   Yes [provider]  omeprazole (PRILOSEC) 20 MG capsule Take 10 mg by mouth daily.    Yes [provider]  fluconazole (DIFLUCAN) 150 MG tablet Take 1 tablet (150 mg total) by mouth daily. 11/06/16   Payton Mccallumonty, Dorien Mayotte, MD  nitrofurantoin, macrocrystal-monohydrate, (MACROBID) 100 MG capsule Take 1 capsule (100 mg total) by mouth 2 (two) times daily. 11/06/16   Payton Mccallumonty, Blakely Gluth, MD    Family History History reviewed. No pertinent family history.  Social History Social History  Substance Use Topics  . Smoking status: Current Every Day Smoker    Packs/day: 0.50    Types: Cigarettes  . Smokeless tobacco: Never Used  . Alcohol use No     Allergies   Bactrim [sulfamethoxazole-trimethoprim]   Review of Systems Review of Systems  Constitutional: Negative for fever.  Gastrointestinal: Negative for abdominal pain, nausea and vomiting.  Genitourinary: Positive for dysuria. Negative for flank pain and vaginal discharge.     Physical Exam Triage Vital Signs ED Triage Vitals  Enc Vitals Group     BP 11/06/16 1155 114/80     Pulse Rate 11/06/16 1155 87     Resp 11/06/16 1155 16     Temp 11/06/16 1155 98.4 F (36.9 C)     Temp Source 11/06/16 1155 Oral     SpO2 11/06/16 1155 97 %     Weight 11/06/16 1158 297 lb (134.7 kg)     Height 11/06/16 1158 5\' 8"  (1.727 m)  Head Circumference --      Peak Flow --      Pain Score 11/06/16 1200 0     Pain Loc --      Pain Edu? --      Excl. in [place]? --    No data found.   Updated Vital Signs BP 114/80 (BP Location: Right Arm)   Pulse 87   Temp 98.4 F (36.9 C) (Oral)   Resp 16   Ht 5\' 8"  (1.727 m)   Wt 297 lb (134.7 kg)   SpO2 97%   BMI 45.16 kg/m   Visual Acuity Right Eye Distance:   Left Eye Distance:   Bilateral Distance:    Right Eye Near:   Left Eye Near:    Bilateral Near:     Physical Exam  Constitutional: She appears well-developed and well-nourished. No distress.  Abdominal: Soft. Bowel sounds are normal. She exhibits no distension and no mass. There is no tenderness. There is no rebound and no guarding.    Skin: She is not diaphoretic.  Nursing note and vitals reviewed.    UC Treatments / Results  Labs (all labs ordered are listed, but only abnormal results are displayed) Labs Reviewed  URINALYSIS, COMPLETE (UACMP) WITH MICROSCOPIC - Abnormal; Notable for the following:       Result Value   Color, Urine AMBER (*)    APPearance CLOUDY (*)    Hgb urine dipstick MODERATE (*)    Bilirubin Urine SMALL (*)    Ketones, ur TRACE (*)    Protein, ur 100 (*)    Nitrite POSITIVE (*)    Leukocytes, UA SMALL (*)    Squamous Epithelial / LPF 6-30 (*)    Bacteria, UA MANY (*)    All other components within normal limits  URINE CULTURE    EKG  EKG Interpretation None       Radiology No results found.  Procedures Procedures (including critical care time)  Medications Ordered in UC Medications - No data to display   Initial Impression / Assessment and Plan / UC Course  I have reviewed the triage vital signs and the nursing notes.  Pertinent labs & imaging results that were available during my care of the patient were reviewed by me and considered in my medical decision making (see chart for details).       Final Clinical Impressions(s) / UC Diagnoses   Final diagnoses:  Urinary tract infection with hematuria, site unspecified    New Prescriptions Discharge Medication List as of 11/06/2016 12:55 PM    START taking these medications   Details  fluconazole (DIFLUCAN) 150 MG tablet Take 1 tablet (150 mg total) by mouth daily., Starting Fri 11/06/2016, Normal    nitrofurantoin, macrocrystal-monohydrate, (MACROBID) 100 MG capsule Take 1 capsule (100 mg total) by mouth 2 (two) times daily., Starting Fri 11/06/2016, Normal       1. Lab results and diagnosis reviewed with patient 2. rx as per orders above; reviewed possible side effects, interactions, risks and benefits  3. Recommend supportive treatment with increased water intake  4. Follow-up prn if symptoms worsen or don't  improve   Payton Mccallum, MD 11/06/16 1303

## 2016-11-06 NOTE — ED Triage Notes (Signed)
Patient started having urinary retention last PM.

## 2016-11-08 LAB — URINE CULTURE: Special Requests: NORMAL

## 2017-02-17 ENCOUNTER — Ambulatory Visit: Payer: Self-pay

## 2017-02-24 ENCOUNTER — Ambulatory Visit: Payer: Self-pay

## 2017-03-17 ENCOUNTER — Encounter: Payer: Self-pay | Admitting: *Deleted

## 2017-03-17 ENCOUNTER — Ambulatory Visit
Admission: RE | Admit: 2017-03-17 | Discharge: 2017-03-17 | Disposition: A | Discharge status: Home / Self Care | Payer: Self-pay | Source: Ambulatory Visit | Attending: Oncology | Admitting: Oncology

## 2017-03-17 ENCOUNTER — Encounter (INDEPENDENT_AMBULATORY_CARE_PROVIDER_SITE_OTHER): Payer: Self-pay

## 2017-03-17 ENCOUNTER — Ambulatory Visit: Payer: Self-pay | Attending: Oncology | Admitting: *Deleted

## 2017-03-17 VITALS — BP 113/74 | HR 85 | Temp 98.8°F | Ht 69.0 in | Wt 290.0 lb

## 2017-03-17 DIAGNOSIS — Z0000 Encounter for general adult medical examination without abnormal findings: Secondary | ICD-10-CM

## 2017-03-17 NOTE — Progress Notes (Signed)
Subjective:     Patient ID: Brittany Cobb, female   DOB: 07-10-1970, 46 y.o.   MRN: [id number]  HPI   Review of Systems     Objective:   Physical Exam  Pulmonary/Chest: Right breast exhibits no inverted nipple, no mass, no nipple discharge, no skin change and no tenderness. Left breast exhibits no mass, no nipple discharge, no skin change and no tenderness. Breasts are symmetrical.  Abdominal: There is no splenomegaly or hepatomegaly.  RLQ pain on palpation  Genitourinary: No labial fusion. There is no rash, tenderness, lesion or injury on the right labia. There is no tenderness, lesion or injury on the left labia. Cervix exhibits no motion tenderness, no discharge and no friability. Right adnexum displays no mass, no tenderness and no fullness. Left adnexum displays no mass, no tenderness and no fullness. No erythema, tenderness or bleeding in the vagina. No foreign body in the vagina. No signs of injury around the vagina. No vaginal discharge found.         Assessment:     46 year old White female returns to [facility] for annual screening.  Clinical breast exam unremarkable.  Taught self breast awareness.  Tenderness noted in the RLQ.  Patient states she has ovarian pain.  States she has discussed with her primary care provider.  Recommended she discuss again further with her primary care provider.  States last pap was in 2014 in [place], [place].  No pap results for review.  Specimen collected for pap smear.  Patient has been screened for eligibility.  She does not have any insurance, Medicare or Medicaid.  She also meets financial eligibility.  Hand-out given on the Affordable Care Act.    Plan:     Screening mammogram ordered.  Specimen for pap sent to the lab.  Will follow-up per [facility] protocol.

## 2017-03-17 NOTE — Patient Instructions (Signed)
HPV Test The human papillomavirus (HPV) test is used to look for high-risk types of HPV infection. HPV is a group of about 100 viruses. Many of these viruses cause growths on, in, or around the genitals. Most HPV viruses cause infections that usually go away without treatment. However, HPV types 6, 11, 16, and 18 are considered high-risk types of HPV that can increase your risk of cancer of the cervix or anus if the infection is left untreated. An HPV test identifies the DNA (genetic) strands of the HPV infection, so it is also referred to as the HPV DNA test. Although HPV is found in both males and females, the HPV test is only used to screen for increased cancer risk in females:  With an abnormal Pap test.  After treatment of an abnormal Pap test.  Between the ages of 30 and 65.  After treatment of a high-risk HPV infection. The HPV test may be done at the same time as a pelvic exam and Pap test in females over the age of 30. Both the HPV test and Pap test require a sample of cells from the cervix. How do I prepare for this test?  Do not douche or take a bath for 24-48 hours before the test or as directed by your health care provider.  Do not have sex for 24-48 hours before the test or as directed by your health care provider.  You may be asked to reschedule the test if you are menstruating.  You will be asked to urinate before the test. What do the results mean? It is your responsibility to obtain your test results. Ask the lab or department performing the test when and how you will get your results. Talk with your health care provider if you have any questions about your results. Your result will be negative or positive. Meaning of Negative Test Results  A negative HPV test result means that no HPV was found, and it is very likely that you do not have HPV. Meaning of Positive Test Results  A positive HPV test result indicates that you have HPV.  If your test result shows the presence  of any high-risk HPV strains, you may have an increased risk of developing cancer of the cervix or anus if the infection is left untreated.  If any low-risk HPV strains are found, you are not likely to have an increased risk of cancer. Discuss your test results with your health care provider. He or she will use the results to make a diagnosis and determine a treatment plan that is right for you. Talk with your health care provider to discuss your results, treatment options, and if necessary, the need for more tests. Talk with your health care provider if you have any questions about your results. This information is not intended to replace advice given to you by your health care provider. Make sure you discuss any questions you have with your health care provider. Document Released: 07/03/2004 Document Revised: 02/12/2016 Document Reviewed: 10/24/2013 Elsevier Interactive Patient Education  2017 Elsevier Inc.  Gave patient hand-out, Women Staying Healthy, Active and Well from [facility], with education on breast health, pap smears, heart and colon health.  

## 2017-03-19 LAB — PAP LB AND HPV HIGH-RISK
HPV, HIGH-RISK: NEGATIVE
PAP Smear Comment: 0

## 2017-03-22 ENCOUNTER — Encounter: Payer: Self-pay | Admitting: *Deleted

## 2017-03-22 NOTE — Progress Notes (Signed)
Letter mailed to inform patient of her normal mammogram and pap smear.  Next mammo in one year and pap in 5 years.  [facility]IS to [place].

## 2022-08-11 ENCOUNTER — Emergency Department: Payer: Medicaid Other

## 2022-08-11 ENCOUNTER — Other Ambulatory Visit: Payer: Self-pay

## 2022-08-11 ENCOUNTER — Inpatient Hospital Stay
Admission: EM | Admit: 2022-08-11 | Discharge: 2022-08-26 | DRG: 516 | Disposition: A | Discharge status: Home Health | Payer: Medicaid Other | Attending: Student | Admitting: Student

## 2022-08-11 ENCOUNTER — Encounter: Payer: Self-pay | Admitting: Emergency Medicine

## 2022-08-11 DIAGNOSIS — Z882 Allergy status to sulfonamides status: Secondary | ICD-10-CM

## 2022-08-11 DIAGNOSIS — M5416 Radiculopathy, lumbar region: Secondary | ICD-10-CM | POA: Diagnosis present

## 2022-08-11 DIAGNOSIS — M25562 Pain in left knee: Secondary | ICD-10-CM | POA: Diagnosis present

## 2022-08-11 DIAGNOSIS — Z6841 Body Mass Index (BMI) 40.0 and over, adult: Secondary | ICD-10-CM

## 2022-08-11 DIAGNOSIS — K5900 Constipation, unspecified: Secondary | ICD-10-CM | POA: Diagnosis present

## 2022-08-11 DIAGNOSIS — M48061 Spinal stenosis, lumbar region without neurogenic claudication: Secondary | ICD-10-CM | POA: Diagnosis present

## 2022-08-11 DIAGNOSIS — R339 Retention of urine, unspecified: Secondary | ICD-10-CM | POA: Diagnosis present

## 2022-08-11 DIAGNOSIS — R52 Pain, unspecified: Secondary | ICD-10-CM

## 2022-08-11 DIAGNOSIS — E66813 Obesity, class 3: Secondary | ICD-10-CM | POA: Insufficient documentation

## 2022-08-11 DIAGNOSIS — T502X5A Adverse effect of carbonic-anhydrase inhibitors, benzothiadiazides and other diuretics, initial encounter: Secondary | ICD-10-CM | POA: Diagnosis not present

## 2022-08-11 DIAGNOSIS — W010XXA Fall on same level from slipping, tripping and stumbling without subsequent striking against object, initial encounter: Secondary | ICD-10-CM | POA: Diagnosis present

## 2022-08-11 DIAGNOSIS — M1712 Unilateral primary osteoarthritis, left knee: Secondary | ICD-10-CM | POA: Diagnosis present

## 2022-08-11 DIAGNOSIS — F4321 Adjustment disorder with depressed mood: Secondary | ICD-10-CM | POA: Insufficient documentation

## 2022-08-11 DIAGNOSIS — M25561 Pain in right knee: Secondary | ICD-10-CM | POA: Diagnosis present

## 2022-08-11 DIAGNOSIS — K219 Gastro-esophageal reflux disease without esophagitis: Secondary | ICD-10-CM | POA: Diagnosis present

## 2022-08-11 DIAGNOSIS — Z7951 Long term (current) use of inhaled steroids: Secondary | ICD-10-CM

## 2022-08-11 DIAGNOSIS — I10 Essential (primary) hypertension: Secondary | ICD-10-CM | POA: Insufficient documentation

## 2022-08-11 DIAGNOSIS — F17210 Nicotine dependence, cigarettes, uncomplicated: Secondary | ICD-10-CM | POA: Diagnosis present

## 2022-08-11 DIAGNOSIS — M4856XA Collapsed vertebra, not elsewhere classified, lumbar region, initial encounter for fracture: Principal | ICD-10-CM | POA: Diagnosis present

## 2022-08-11 DIAGNOSIS — M5432 Sciatica, left side: Secondary | ICD-10-CM | POA: Diagnosis present

## 2022-08-11 DIAGNOSIS — L0390 Cellulitis, unspecified: Secondary | ICD-10-CM | POA: Diagnosis present

## 2022-08-11 DIAGNOSIS — I959 Hypotension, unspecified: Secondary | ICD-10-CM | POA: Diagnosis not present

## 2022-08-11 DIAGNOSIS — E876 Hypokalemia: Secondary | ICD-10-CM | POA: Diagnosis not present

## 2022-08-11 DIAGNOSIS — S32010A Wedge compression fracture of first lumbar vertebra, initial encounter for closed fracture: Secondary | ICD-10-CM

## 2022-08-11 DIAGNOSIS — Z79899 Other long term (current) drug therapy: Secondary | ICD-10-CM

## 2022-08-11 DIAGNOSIS — F32A Depression, unspecified: Secondary | ICD-10-CM | POA: Diagnosis present

## 2022-08-11 DIAGNOSIS — F419 Anxiety disorder, unspecified: Secondary | ICD-10-CM | POA: Diagnosis present

## 2022-08-11 LAB — CBC WITH DIFFERENTIAL/PLATELET
Abs Immature Granulocytes: 0.04 10*3/uL (ref 0.00–0.07)
Basophils Absolute: 0.1 10*3/uL (ref 0.0–0.1)
Basophils Relative: 1 %
Eosinophils Absolute: 0.2 10*3/uL (ref 0.0–0.5)
Eosinophils Relative: 2 %
HCT: 41.1 % (ref 36.0–46.0)
Hemoglobin: 13.5 g/dL (ref 12.0–15.0)
Immature Granulocytes: 1 %
Lymphocytes Relative: 26 %
Lymphs Abs: 2.1 10*3/uL (ref 0.7–4.0)
MCH: 29.1 pg (ref 26.0–34.0)
MCHC: 32.8 g/dL (ref 30.0–36.0)
MCV: 88.6 fL (ref 80.0–100.0)
Monocytes Absolute: 0.6 10*3/uL (ref 0.1–1.0)
Monocytes Relative: 7 %
Neutro Abs: 5 10*3/uL (ref 1.7–7.7)
Neutrophils Relative %: 63 %
Platelets: 243 10*3/uL (ref 150–400)
RBC: 4.64 MIL/uL (ref 3.87–5.11)
RDW: 13.2 % (ref 11.5–15.5)
WBC: 7.9 10*3/uL (ref 4.0–10.5)
nRBC: 0 % (ref 0.0–0.2)

## 2022-08-11 LAB — BASIC METABOLIC PANEL
Anion gap: 8 (ref 5–15)
BUN: 11 mg/dL (ref 6–20)
CO2: 27 mmol/L (ref 22–32)
Calcium: 8.9 mg/dL (ref 8.9–10.3)
Chloride: 106 mmol/L (ref 98–111)
Creatinine, Ser: 0.65 mg/dL (ref 0.44–1.00)
GFR, Estimated: >60 mL/min (ref >60)
Glucose, Bld: 97 mg/dL (ref 70–99)
Potassium: 3.6 mmol/L (ref 3.5–5.1)
Sodium: 141 mmol/L (ref 135–145)

## 2022-08-11 MED ORDER — FUROSEMIDE 20 MG PO TABS
20.0000 mg | ORAL_TABLET | Freq: Two times a day (BID) | ORAL | Status: DC
  Administered 2022-08-12 – 2022-08-19 (×15): 20 mg via ORAL
  Filled 2022-08-11 (×15): qty 1

## 2022-08-11 MED ORDER — TRAZODONE HCL 50 MG PO TABS
50.0000 mg | ORAL_TABLET | Freq: Every day | ORAL | Status: DC
  Administered 2022-08-13 – 2022-08-25 (×11): 50 mg via ORAL
  Filled 2022-08-11 (×14): qty 1

## 2022-08-11 MED ORDER — TOPIRAMATE 25 MG PO TABS
50.0000 mg | ORAL_TABLET | Freq: Every day | ORAL | Status: DC
  Administered 2022-08-11 – 2022-08-25 (×15): 50 mg via ORAL
  Filled 2022-08-11 (×15): qty 2

## 2022-08-11 MED ORDER — MORPHINE SULFATE (PF) 4 MG/ML IV SOLN
4.0000 mg | Freq: Once | INTRAVENOUS | Status: AC
  Administered 2022-08-11: 4 mg via INTRAVENOUS
  Filled 2022-08-11: qty 1

## 2022-08-11 MED ORDER — PANTOPRAZOLE SODIUM 40 MG PO TBEC
40.0000 mg | DELAYED_RELEASE_TABLET | Freq: Two times a day (BID) | ORAL | Status: DC
  Administered 2022-08-11 – 2022-08-26 (×30): 40 mg via ORAL
  Filled 2022-08-11 (×30): qty 1

## 2022-08-11 MED ORDER — SENNOSIDES-DOCUSATE SODIUM 8.6-50 MG PO TABS: 1.0000 | ORAL_TABLET | Freq: Every evening | ORAL | Status: DC | PRN

## 2022-08-11 MED ORDER — FENTANYL CITRATE PF 50 MCG/ML IJ SOSY: 50.0000 ug | PREFILLED_SYRINGE | INTRAMUSCULAR | Status: DC | PRN

## 2022-08-11 MED ORDER — LORAZEPAM 0.5 MG PO TABS
0.5000 mg | ORAL_TABLET | Freq: Three times a day (TID) | ORAL | Status: DC | PRN
  Administered 2022-08-13 – 2022-08-23 (×3): 0.5 mg via ORAL
  Filled 2022-08-11 (×3): qty 1

## 2022-08-11 MED ORDER — ONDANSETRON HCL 4 MG/2ML IJ SOLN
4.0000 mg | Freq: Once | INTRAMUSCULAR | Status: AC
  Administered 2022-08-11: 4 mg via INTRAVENOUS
  Filled 2022-08-11: qty 2

## 2022-08-11 MED ORDER — HYDROCODONE-ACETAMINOPHEN 10-325 MG PO TABS
1.0000 | ORAL_TABLET | Freq: Four times a day (QID) | ORAL | Status: DC | PRN
  Filled 2022-08-11: qty 1

## 2022-08-11 MED ORDER — MOMETASONE FURO-FORMOTEROL FUM 200-5 MCG/ACT IN AERO
2.0000 | INHALATION_SPRAY | Freq: Two times a day (BID) | RESPIRATORY_TRACT | Status: DC
  Administered 2022-08-13 – 2022-08-25 (×20): 2 via RESPIRATORY_TRACT
  Filled 2022-08-11: qty 8.8

## 2022-08-11 MED ORDER — HYDRALAZINE HCL 20 MG/ML IJ SOLN: 5.0000 mg | Freq: Three times a day (TID) | INTRAMUSCULAR | Status: AC | PRN

## 2022-08-11 MED ORDER — VALACYCLOVIR HCL 500 MG PO TABS
1000.0000 mg | ORAL_TABLET | Freq: Every day | ORAL | Status: DC
  Administered 2022-08-12 – 2022-08-26 (×15): 1000 mg via ORAL
  Filled 2022-08-11 (×15): qty 2

## 2022-08-11 MED ORDER — ACETAMINOPHEN 325 MG PO TABS
650.0000 mg | ORAL_TABLET | Freq: Four times a day (QID) | ORAL | Status: DC | PRN
  Administered 2022-08-12 – 2022-08-13 (×2): 650 mg via ORAL
  Filled 2022-08-11 (×2): qty 2

## 2022-08-11 MED ORDER — ONDANSETRON HCL 4 MG PO TABS: 4.0000 mg | ORAL_TABLET | Freq: Four times a day (QID) | ORAL | Status: AC | PRN

## 2022-08-11 MED ORDER — MORPHINE SULFATE (PF) 4 MG/ML IV SOLN
4.0000 mg | INTRAVENOUS | Status: DC | PRN
  Administered 2022-08-11 – 2022-08-12 (×3): 4 mg via INTRAVENOUS
  Filled 2022-08-11 (×3): qty 1

## 2022-08-11 MED ORDER — AMLODIPINE BESYLATE 5 MG PO TABS
5.0000 mg | ORAL_TABLET | Freq: Every day | ORAL | Status: DC
  Administered 2022-08-11 – 2022-08-22 (×12): 5 mg via ORAL
  Filled 2022-08-11 (×13): qty 1

## 2022-08-11 MED ORDER — MELATONIN 5 MG PO TABS
5.0000 mg | ORAL_TABLET | Freq: Every evening | ORAL | Status: AC | PRN
  Filled 2022-08-11: qty 1

## 2022-08-11 MED ORDER — ACETAMINOPHEN 650 MG RE SUPP: 650.0000 mg | Freq: Four times a day (QID) | RECTAL | Status: DC | PRN

## 2022-08-11 MED ORDER — VENLAFAXINE HCL ER 150 MG PO CP24
150.0000 mg | ORAL_CAPSULE | Freq: Every day | ORAL | Status: DC
  Administered 2022-08-11 – 2022-08-26 (×16): 150 mg via ORAL
  Filled 2022-08-11 (×16): qty 1

## 2022-08-11 MED ORDER — LAMOTRIGINE 100 MG PO TABS
200.0000 mg | ORAL_TABLET | Freq: Every day | ORAL | Status: DC
  Administered 2022-08-11 – 2022-08-25 (×15): 200 mg via ORAL
  Filled 2022-08-11 (×5): qty 2
  Filled 2022-08-11: qty 8
  Filled 2022-08-11: qty 2
  Filled 2022-08-11: qty 8
  Filled 2022-08-11: qty 2
  Filled 2022-08-11: qty 8
  Filled 2022-08-11: qty 2
  Filled 2022-08-11: qty 8
  Filled 2022-08-11: qty 2
  Filled 2022-08-11: qty 8
  Filled 2022-08-11: qty 2

## 2022-08-11 MED ORDER — ONDANSETRON HCL 4 MG/2ML IJ SOLN
4.0000 mg | Freq: Four times a day (QID) | INTRAMUSCULAR | Status: AC | PRN
  Administered 2022-08-11 – 2022-08-13 (×3): 4 mg via INTRAVENOUS
  Filled 2022-08-11 (×3): qty 2

## 2022-08-11 MED ORDER — LIDOCAINE 5 % EX PTCH
2.0000 | MEDICATED_PATCH | CUTANEOUS | Status: AC
  Administered 2022-08-11 – 2022-08-15 (×4): 2 via TRANSDERMAL
  Filled 2022-08-11 (×5): qty 2

## 2022-08-11 MED ORDER — ALBUTEROL SULFATE (2.5 MG/3ML) 0.083% IN NEBU
3.0000 mL | INHALATION_SOLUTION | Freq: Four times a day (QID) | RESPIRATORY_TRACT | Status: DC | PRN
  Administered 2022-08-17: 3 mL via RESPIRATORY_TRACT
  Filled 2022-08-11: qty 3

## 2022-08-11 MED ORDER — ENOXAPARIN SODIUM 80 MG/0.8ML IJ SOSY
0.5000 mg/kg | PREFILLED_SYRINGE | INTRAMUSCULAR | Status: DC
  Administered 2022-08-11 – 2022-08-17 (×7): 72.5 mg via SUBCUTANEOUS
  Filled 2022-08-11 (×8): qty 0.72

## 2022-08-11 MED ORDER — OXYCODONE-ACETAMINOPHEN 5-325 MG PO TABS
2.0000 | ORAL_TABLET | Freq: Once | ORAL | Status: AC
  Administered 2022-08-11: 2 via ORAL
  Filled 2022-08-11: qty 2

## 2022-08-11 NOTE — Assessment & Plan Note (Addendum)
Secondary to acute superior endplate compression fracture at L1 - Neurosurgery, Dr. Cari Caraway has been consulted and recommends QuickDraw LSO brace, which has been ordered by ED service - Per EDP, neurosurgery is okay for patient to be discharged with outpatient follow-up - Symptomatic support: Lidocaine patches, 2 patches ordered for placement.  Acetaminophen 650 mg every 6 hours as needed for mild pain; Norco 10-325 mg every 6 hours as needed for moderate pain, 5 days ordered; morphine 4 mg IV every 4 hours as needed for severe pain, 4 doses ordered; fentanyl 50 mcg IV every 3 hours as needed for pain refractory to IV morphine, 16 hours ordered - AM team to reevaluate patient at bedside for continued opioid pain requirements - Patient may benefit from physical therapy/Occupational Therapy once patient's pain is more controlled - Admit to [place], observation

## 2022-08-11 NOTE — Progress Notes (Signed)
Orthopedic Tech Progress Note Patient Details:  Brittany Cobb August 03, 1970 [id number] Called in order for LSO Patient ID: Brittany Cobb, female   DOB: Jul 03, 1970, 52 y.o.   MRN: [id number]  [name] 08/11/2022, 5:11 PM

## 2022-08-11 NOTE — Assessment & Plan Note (Signed)
-   Resumed home amlodipine 5 mg daily, furosemide 20 mg p.o. twice daily - Hydralazine 5 mg IV every 8 hours as needed for SBP greater than 180, 4 days ordered

## 2022-08-11 NOTE — Assessment & Plan Note (Addendum)
-   Her mother passed in November 2023 from fistula, sepsis. - Patient is tearful at bedside - Spiritual care consulted at patient's request

## 2022-08-11 NOTE — Assessment & Plan Note (Signed)
-   This complicates overall care and prognosis.  

## 2022-08-11 NOTE — Assessment & Plan Note (Signed)
-   Trazodone 50 mg nightly, venlafaxine 150 mg p.o. daily - Lorazepam 0.5 mg p.o. every 8 hours as needed for anxiety resumed

## 2022-08-11 NOTE — Consult Note (Signed)
Full note to follow.  L1 compression fracture without concerning features  - LSO brace when OOB - OK to work with PTOT in brace

## 2022-08-11 NOTE — Hospital Course (Addendum)
[name] is a 52 year old female with history of depression, anxiety, hypertension, GERD, morbid obesity, history of urinary retention, with no history of asthma, who presents emergency department for chief concerns of new acute low back pain.  Of note patient slipped and fell on her bottom and has had difficulty ambulating since.  Initial vitals in the ED showed temperature of 98.6, respiration rate 18, heart rate of 86, blood pressure 139/74, SpO2 97% on room air.  Serum sodium is 141, potassium 3.6, chloride 106, bicarb 27, BUN of 11, serum creatinine of 0.65, EGFR greater than 60, nonfasting blood glucose 97, WBC 7.9, hemoglobin 13.5, platelets of 243.  EDP consulted neurosurgery, Dr. Cari Caraway who recommends QuickDraw LSO brace with outpatient follow-up to neurosurgery.  ED treatment: Patient received oxycodone-acetaminophen 5-3 25 mg tablet, 2 tablets, ondansetron 4 mg IV, and then morphine 4 mg IV.  Patient was still unable to ambulate, endorsing severe pain.  This prompted EDP to consult hospitalist service for admission, pain control.

## 2022-08-11 NOTE — Progress Notes (Signed)
   08/11/22 2000  Spiritual Encounters  Type of Visit Initial  Care provided to: Patient  Referral source Nurse (RN/NT/LPN)  Reason for visit Grief/loss  OnCall Visit Yes  Interventions  Spiritual Care Interventions Made Reflective listening;Bereavement/grief support  Intervention Outcomes  Outcomes Reduced anxiety   Chaplain responded to [facility] consult to provide support to patient who was is grieving recent loss of mother.

## 2022-08-11 NOTE — ED Provider Notes (Signed)
[facility] Provider Note    Event Date/Time   First MD Initiated Contact with Patient 08/11/22 1249     (approximate)   History   Chief Complaint Back Pain   HPI Brittany Cobb is a 52 y.o. female, history of GERD, anxiety, depression, presents to the emergency department for evaluation of back pain/knee pain following mechanical fall.  She states that her right foot slipped on a carpet square, causing her right leg to go backwards on her left leg to go forwards.  She subsequently fell onto her back.  She reportedly called 911 due to the pain and has not ambulated since.  Currently endorses significant pain in her lower back, as well as her right knee.  Denies any head injury.  Denies blood thinner use.  Denies fever/chills, chest pain, shortness of breath, abdominal pain, paresthesias, weakness, rashes, or dizziness/lightheadedness.  History Limitations: No limitations.        Physical Exam  Triage Vital Signs: ED Triage Vitals  Enc Vitals Group     BP 08/11/22 1219 139/74     Pulse Rate 08/11/22 1219 86     Resp 08/11/22 1219 18     Temp 08/11/22 1219 98.6 F (37 C)     Temp Source 08/11/22 1219 Oral     SpO2 08/11/22 1219 97 %     Weight 08/11/22 1218 (!) 325 lb (147.4 kg)     Height 08/11/22 1218 5' 8"$  (1.727 m)     Head Circumference --      Peak Flow --      Pain Score 08/11/22 1218 10     Pain Loc --      Pain Edu? --      Excl. in [place]? --     Most recent vital signs: Vitals:   08/11/22 1219  BP: 139/74  Pulse: 86  Resp: 18  Temp: 98.6 F (37 C)  SpO2: 97%    General: Awake, appears uncomfortable. Skin: Warm, dry. No rashes or lesions.  Eyes: PERRL. Conjunctivae normal.  CV: Good peripheral perfusion.  Resp: Normal effort.  Abd: Soft, non-tender. No distention.  Neuro: At baseline. No gross neurological deficits.   Focused Exam: Midline spinal tenderness in the lumbar region.  Patient endorses pain with any and all  manipulation of the right knee.  Tenderness appreciated along the patella.  No joint laxity.  PMS intact distally.  Physical Exam    ED Results / Procedures / Treatments  Labs (all labs ordered are listed, but only abnormal results are displayed) Labs Reviewed  CBC WITH DIFFERENTIAL/PLATELET  BASIC METABOLIC PANEL     EKG N/A.    RADIOLOGY  ED Provider Interpretation: I personally reviewed and interpreted the CT scan, no evidence of acute fractures on the ECG.  Lumbar CT shows compression fracture along L1.  CT Thoracic Spine Wo Contrast  Result Date: 08/11/2022 CLINICAL DATA:  Back pain after fall with known L1 vertebral body compression fracture EXAM: CT THORACIC SPINE WITHOUT CONTRAST TECHNIQUE: Multidetector CT images of the thoracic were obtained using the standard protocol without intravenous contrast. RADIATION DOSE REDUCTION: This exam was performed according to the departmental dose-optimization program which includes automated exposure control, adjustment of the mA and/or kV according to patient size and/or use of iterative reconstruction technique. COMPARISON:  Same day lumbar spine CT FINDINGS: Alignment: Preserved thoracic kyphosis without static listhesis. Vertebrae: Acute superior endplate compression fracture of the L1 vertebral body, as described on dedicated CT  of the lumbar spine. Thoracic vertebral body heights are maintained without evidence of fracture. No pathologic bone process. Paraspinal and other soft tissues: Negative. Disc levels: Thoracic intervertebral disc heights are preserved. Minimal facet joint hypertrophy within the lower thoracic spine. IMPRESSION: 1. No acute osseous abnormality of the thoracic spine. 2. Acute superior endplate compression fracture of the L1 vertebral body, as described on dedicated CT of the lumbar spine. Electronically Signed   By: Davina Poke D.O.   On: 08/11/2022 16:08   CT Knee Right Wo Contrast  Result Date:  08/11/2022 CLINICAL DATA:  Knee trauma, no prior imaging (Age >= 5y) Unable to ambulate EXAM: CT OF THE RIGHT KNEE WITHOUT CONTRAST TECHNIQUE: Multidetector CT imaging of the right knee was performed according to the standard protocol. Multiplanar CT image reconstructions were also generated. RADIATION DOSE REDUCTION: This exam was performed according to the departmental dose-optimization program which includes automated exposure control, adjustment of the mA and/or kV according to patient size and/or use of iterative reconstruction technique. COMPARISON:  None Available. FINDINGS: Bones/Joint/Cartilage There is no evidence of acute fracture. There is tricompartment osteophyte formation with moderate-severe medial compartment joint space narrowing. There is a trace joint effusion. There is a small ossified joint body in the superolateral knee measuring 6 mm. Ligaments Suboptimally assessed by CT. Muscles and Tendons No acute myotendinous abnormality by CT. Soft tissues No focal fluid collection. IMPRESSION: No evidence of acute fracture. 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 Trace joint effusion. Electronically Signed   By: Maurine Simmering M.D.   On: 08/11/2022 14:09   CT Lumbar Spine Wo Contrast  Result Date: 08/11/2022 CLINICAL DATA:  Back pain. EXAM: CT LUMBAR SPINE WITHOUT CONTRAST TECHNIQUE: Multidetector CT imaging of the lumbar spine was performed without intravenous contrast administration. Multiplanar CT image reconstructions were also generated. RADIATION DOSE REDUCTION: This exam was performed according to the departmental dose-optimization program which includes automated exposure control, adjustment of the mA and/or kV according to patient size and/or use of iterative reconstruction technique. COMPARISON:  None Available. FINDINGS: Segmentation: 5 lumbar type vertebrae. Alignment: Trace anterolisthesis of L4 on L5. Vertebrae: There is an acute appearing superior  endplate compression deformity at L1. There is no evidence of retropulsion. There is a proximally 40% height loss. No evidence of an epidural hematoma. Paraspinal and other soft tissues: Small left interpolar renal stone. No hydronephrosis. Disc levels: Moderate spinal canal stenosis at L4-L5 secondary to combination of a small disc bulge and ligamentum flavum hypertrophy. There is also moderate right-sided neural foraminal stenosis at L4-L5. IMPRESSION: 1. Acute appearing superior endplate compression deformity at L1 with approximately 40% height loss. No evidence of retropulsion. No evidence of an epidural hematoma. 2. Moderate spinal canal stenosis at L4-L5 secondary to combination of a small disc bulge and ligamentum flavum hypertrophy. Moderate right-sided neural foraminal stenosis at L4-L5. Electronically Signed   By: Marin Roberts M.D.   On: 08/11/2022 14:00    PROCEDURES:  Critical Care performed: N/A.  Procedures    MEDICATIONS ORDERED IN ED: Medications  oxyCODONE-acetaminophen (PERCOCET/ROXICET) 5-325 MG per tablet 2 tablet (2 tablets Oral Given 08/11/22 1308)  morphine (PF) 4 MG/ML injection 4 mg (4 mg Intravenous Given 08/11/22 1428)  ondansetron (ZOFRAN) injection 4 mg (4 mg Intravenous Given 08/11/22 1451)     IMPRESSION / MDM / ASSESSMENT AND PLAN / ED COURSE  I reviewed the triage vital signs and the nursing notes.  Differential diagnosis includes, but is not limited to, compression fracture, vertebral fracture, lumbar strain, ACL/PCL injury, MCL/LCL injury, osteoarthritis, knee sprain, patella fracture, knee dislocation, proximal tibial fracture  Assessment/Plan Patient presents with back pain and knee pain x 1 day following mechanical fall.  She is unable to ambulate due to the pain, despite analgesics here in the emergency department.  Her CT imaging shows acute appearing superior endplate compression deformity of L1 with approximately 4%  vertebral height loss, which I suspect is a primary source of her pain.  Her right knee CT did not show any acute abnormalities.  Spoke with Dr. Cari Caraway on the phone, who recommended QuickDraw LSO brace follow-up outpatient.  After 2 rounds of analgesics, patient is still unable to ambulate.  Will plan to admit for pain control.  Spoke to the hospitalist, Dr. Tobie Poet, who accepted admission.  Patient's presentation is most consistent with acute complicated illness / injury requiring diagnostic workup.       FINAL CLINICAL IMPRESSION(S) / ED DIAGNOSES   Final diagnoses:  Compression fracture of L1 lumbar vertebra, closed, initial encounter ([place])     Rx / DC Orders   ED Discharge Orders     None        Note:  This document was prepared using Dragon voice recognition software and may include unintentional dictation errors.   Teodoro Spray, [place] 08/11/22 1648    Naaman Plummer, MD 08/12/22 [phone]

## 2022-08-11 NOTE — H&P (Signed)
History and Physical   Brittany Cobb [id number] DOB: 01/19/1971 DOA: 08/11/2022  PCP: Marzetta Board, DO  Patient coming from: Home via EMS  I have personally briefly reviewed patient's old medical records in [place].  Chief Concern: Back pain  HPI: [name] is a 52 year old female with history of depression, anxiety, hypertension, GERD, morbid obesity, history of urinary retention, with no history of asthma, who presents emergency department for chief concerns of new acute low back pain.  Of note patient slipped and fell on her bottom and has had difficulty ambulating since.  Initial vitals in the ED showed temperature of 98.6, respiration rate 18, heart rate of 86, blood pressure 139/74, SpO2 97% on room air.  Serum sodium is 141, potassium 3.6, chloride 106, bicarb 27, BUN of 11, serum creatinine of 0.65, EGFR greater than 60, nonfasting blood glucose 97, WBC 7.9, hemoglobin 13.5, platelets of 243.  EDP consulted neurosurgery, Dr. Cari Caraway who recommends QuickDraw LSO brace with outpatient follow-up to neurosurgery.  ED treatment: Patient received oxycodone-acetaminophen 5-3 25 mg tablet, 2 tablets, ondansetron 4 mg IV, and then morphine 4 mg IV.  Patient was still unable to ambulate, endorsing severe pain.  This prompted EDP to consult hospitalist service for admission, pain control. ----------------------------------- At bedside, she was tell me her name, age, current year, current location of [place], [place].   She states she slipped on a carpet square in her front door. She fell and landed on her bottom. She denies hitting her head or losing consciosness.   She denies chest pain, shortness of breath, nausea, vomiting, dysuria, hematuria, diarrhea. She denies fever, swelling of her lower extremites.   Social history: She lives by herself. She is a former tobacco user, quitting in 2020. At her peak, she was smoking 1 ppd. She denies etoh. 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 She was working as a Administrator.   ROS: Constitutional: no weight change, no fever ENT/Mouth: no sore throat, no rhinorrhea Eyes: no eye pain, no vision changes Cardiovascular: no chest pain, no dyspnea,  no edema, no palpitations Respiratory: no cough, no sputum, no wheezing Gastrointestinal: no nausea, no vomiting, no diarrhea, no constipation Genitourinary: no urinary incontinence, no dysuria, no hematuria Musculoskeletal: + arthralgias, no myalgias, + low back pain Skin: no skin lesions, no pruritus, Neuro: + weakness, no loss of consciousness, no syncope Psych: no anxiety, no depression, no decrease appetite Heme/Lymph: no bruising, no bleeding  ED Course: Discussed with emergency medicine provider, patient requiring hospitalization for chief concerns of pain control.  Assessment/Plan  Principal Problem:   Inadequate pain control Active Problems:   Obesity, Class III, BMI 40-49.9 (morbid obesity) (HCC)   Essential hypertension   GERD (gastroesophageal reflux disease)   Anxiety   Grieving   Assessment and Plan:  * Inadequate pain control Secondary to acute superior endplate compression fracture at L1 - Neurosurgery, Dr. Cari Caraway has been consulted and recommends QuickDraw LSO brace, which has been ordered by ED service - Per EDP, neurosurgery is okay for patient to be discharged with outpatient follow-up - Symptomatic support: Lidocaine patches, 2 patches ordered for placement.  Acetaminophen 650 mg every 6 hours as needed for mild pain; Norco 10-325 mg every 6 hours as needed for moderate pain, 5 days ordered; morphine 4 mg IV every 4 hours as needed for severe pain, 4 doses ordered; fentanyl 50 mcg IV every 3 hours as needed for  pain refractory to IV morphine, 16 hours ordered - AM team to reevaluate patient at bedside for continued opioid pain requirements - Patient may benefit from physical  therapy/Occupational Therapy once patient's pain is more controlled - Admit to MedSurg, observation  Grieving - Her mother passed in November 2023 from fistula, sepsis. - Patient is tearful at bedside - Spiritual care consulted at patient's request  Anxiety - Trazodone 50 mg nightly, venlafaxine 150 mg p.o. daily - Lorazepam 0.5 mg p.o. every 8 hours as needed for anxiety resumed  GERD (gastroesophageal reflux disease) - PPI  Essential hypertension - Resumed home amlodipine 5 mg daily, furosemide 20 mg p.o. twice daily - Hydralazine 5 mg IV every 8 hours as needed for SBP greater than 180, 4 days ordered  Obesity, Class III, BMI 40-49.9 (morbid obesity) ([place]) - This complicates overall care and prognosis.   Psychiatric imbalance-resumed home lamotrigine 200 mg nightly  Chart reviewed.   DVT prophylaxis: Enoxaparin Code Status: Full code Diet: Heart diet Family Communication: No Disposition Plan: Pending clinical course Consults called: EDP discussed patient's case with neurosurgery Admission status: MedSurg, observation  Past Medical History:  Diagnosis Date   Acid reflux    Anxiety    Depression    Past Surgical History:  Procedure Laterality Date   BREAST BIOPSY Left 2014   benign with s shaped clip   ENDOMETRIAL ABLATION     KNEE SURGERY Left    TONSILLECTOMY     TUBAL LIGATION     Social History:  reports that she has been smoking cigarettes. She has been smoking an average of .5 packs per day. She has never used smokeless tobacco. She reports that she does not drink alcohol and does not use drugs.  Allergies  Allergen Reactions   Bactrim [Sulfamethoxazole-Trimethoprim] Hives   History reviewed. No pertinent family history. Family history: Family history reviewed and not pertinent  Prior to Admission medications   Medication Sig Start Date End Date Taking? Authorizing Provider  ALPRAZolam Duanne Moron) 0.5 MG tablet Take 0.5 mg by mouth at bedtime as needed  for anxiety.    [provider]  fluconazole (DIFLUCAN) 150 MG tablet Take 1 tablet (150 mg total) by mouth daily. 11/06/16   Norval Gable, MD  hydrochlorothiazide (HYDRODIURIL) 25 MG tablet Take 37.5 mg by mouth daily.    [provider]  HYDROcodone-acetaminophen (NORCO) 10-325 MG tablet Take 1 tablet by mouth every 6 (six) hours as needed.    [provider]  nitrofurantoin, macrocrystal-monohydrate, (MACROBID) 100 MG capsule Take 1 capsule (100 mg total) by mouth 2 (two) times daily. 11/06/16   Norval Gable, MD  omeprazole (PRILOSEC) 20 MG capsule Take 10 mg by mouth daily.     [provider]   Physical Exam: Vitals:   08/11/22 1218 08/11/22 1219 08/11/22 1920 08/11/22 1922  BP:  139/74  (!) 120/58  Pulse:  86 77   Resp:  18 17   Temp:  98.6 F (37 C) 98.1 F (36.7 C)   TempSrc:  Oral Oral   SpO2:  97% 96%   Weight: (!) 147.4 kg     Height: 5' 8"$  (1.727 m)      Constitutional: appears age appropriate, NAD, calm, comfortable Eyes: PERRL, lids and conjunctivae normal ENMT: Mucous membranes are moist. Posterior pharynx clear of any exudate or lesions. Age-appropriate dentition. Hearing appropriate Neck: normal, supple, no masses, no thyromegaly Respiratory: clear to auscultation bilaterally, no wheezing, no crackles. Normal respiratory effort. No accessory muscle  use.  Cardiovascular: Regular rate and rhythm, no murmurs / rubs / gallops. No extremity edema. 2+ pedal pulses. No carotid bruits.  Abdomen: Morbidly obese abdomen, no tenderness, no masses palpated, no hepatosplenomegaly. Bowel sounds positive.  Musculoskeletal: no clubbing / cyanosis. No joint deformity upper and lower extremities. Good ROM, no contractures, no atrophy. Normal muscle tone.  Skin: no rashes, lesions, ulcers. No induration.  Multiple tattoos in extremities, but appear to be well-healed Neurologic: Sensation intact. Strength 5/5 in all 4.  Psychiatric: Normal judgment  and insight. Alert and oriented x 3.  Depressed and tearful mood.   EKG: Not indicated at this time  Chest x-ray on Admission: Not indicated at this time  CT Thoracic Spine Wo Contrast  Result Date: 08/11/2022 CLINICAL DATA:  Back pain after fall with known L1 vertebral body compression fracture EXAM: CT THORACIC SPINE WITHOUT CONTRAST TECHNIQUE: Multidetector CT images of the thoracic were obtained using the standard protocol without intravenous contrast. RADIATION DOSE REDUCTION: This exam was performed according to the departmental dose-optimization program which includes automated exposure control, adjustment of the mA and/or kV according to patient size and/or use of iterative reconstruction technique. COMPARISON:  Same day lumbar spine CT FINDINGS: Alignment: Preserved thoracic kyphosis without static listhesis. Vertebrae: Acute superior endplate compression fracture of the L1 vertebral body, as described on dedicated CT of the lumbar spine. Thoracic vertebral body heights are maintained without evidence of fracture. No pathologic bone process. Paraspinal and other soft tissues: Negative. Disc levels: Thoracic intervertebral disc heights are preserved. Minimal facet joint hypertrophy within the lower thoracic spine. IMPRESSION: 1. No acute osseous abnormality of the thoracic spine. 2. Acute superior endplate compression fracture of the L1 vertebral body, as described on dedicated CT of the lumbar spine. Electronically Signed   By: Davina Poke D.O.   On: 08/11/2022 16:08   CT Knee Right Wo Contrast  Result Date: 08/11/2022 CLINICAL DATA:  Knee trauma, no prior imaging (Age >= 5y) Unable to ambulate EXAM: CT OF THE RIGHT KNEE WITHOUT CONTRAST TECHNIQUE: Multidetector CT imaging of the right knee was performed according to the standard protocol. Multiplanar CT image reconstructions were also generated. RADIATION DOSE REDUCTION: This exam was performed according to the departmental  dose-optimization program which includes automated exposure control, adjustment of the mA and/or kV according to patient size and/or use of iterative reconstruction technique. COMPARISON:  None Available. FINDINGS: Bones/Joint/Cartilage There is no evidence of acute fracture. There is tricompartment osteophyte formation with moderate-severe medial compartment joint space narrowing. There is a trace joint effusion. There is a small ossified joint body in the superolateral knee measuring 6 mm. Ligaments Suboptimally assessed by CT. Muscles and Tendons No acute myotendinous abnormality by CT. Soft tissues No focal fluid collection. IMPRESSION: No evidence of acute fracture. 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 Trace joint effusion. Electronically Signed   By: Maurine Simmering M.D.   On: 08/11/2022 14:09   CT Lumbar Spine Wo Contrast  Result Date: 08/11/2022 CLINICAL DATA:  Back pain. EXAM: CT LUMBAR SPINE WITHOUT CONTRAST TECHNIQUE: Multidetector CT imaging of the lumbar spine was performed without intravenous contrast administration. Multiplanar CT image reconstructions were also generated. RADIATION DOSE REDUCTION: This exam was performed according to the departmental dose-optimization program which includes automated exposure control, adjustment of the mA and/or kV according to patient size and/or use of iterative reconstruction technique. COMPARISON:  None Available. FINDINGS: Segmentation: 5 lumbar type vertebrae. Alignment: Trace anterolisthesis of L4 on L5. Vertebrae: There is an acute  appearing superior endplate compression deformity at L1. There is no evidence of retropulsion. There is a proximally 40% height loss. No evidence of an epidural hematoma. Paraspinal and other soft tissues: Small left interpolar renal stone. No hydronephrosis. Disc levels: Moderate spinal canal stenosis at L4-L5 secondary to combination of a small disc bulge and ligamentum flavum hypertrophy.  There is also moderate right-sided neural foraminal stenosis at L4-L5. IMPRESSION: 1. Acute appearing superior endplate compression deformity at L1 with approximately 40% height loss. No evidence of retropulsion. No evidence of an epidural hematoma. 2. Moderate spinal canal stenosis at L4-L5 secondary to combination of a small disc bulge and ligamentum flavum hypertrophy. Moderate right-sided neural foraminal stenosis at L4-L5. Electronically Signed   By: Marin Roberts M.D.   On: 08/11/2022 14:00    Labs on Admission: I have personally reviewed following labs  CBC: Recent Labs  Lab 08/11/22 1455  WBC 7.9  NEUTROABS 5.0  HGB 13.5  HCT 41.1  MCV 88.6  PLT 0000000   Basic Metabolic Panel: Recent Labs  Lab 08/11/22 1455  NA 141  K 3.6  CL 106  CO2 27  GLUCOSE 97  BUN 11  CREATININE 0.65  CALCIUM 8.9   GFR: Estimated Creatinine Clearance: 126.4 mL/min (by C-G formula based on SCr of 0.65 mg/dL).  Urine analysis:    Component Value Date/Time   COLORURINE AMBER (A) 11/06/2016 1203   APPEARANCEUR CLOUDY (A) 11/06/2016 1203   LABSPEC 1.020 11/06/2016 1203   PHURINE 6.0 11/06/2016 1203   GLUCOSEU NEGATIVE 11/06/2016 1203   HGBUR MODERATE (A) 11/06/2016 1203   BILIRUBINUR SMALL (A) 11/06/2016 1203   KETONESUR TRACE (A) 11/06/2016 1203   PROTEINUR 100 (A) 11/06/2016 1203   UROBILINOGEN 0.2 05/19/2009 1529   NITRITE POSITIVE (A) 11/06/2016 1203   LEUKOCYTESUR SMALL (A) 11/06/2016 1203   This document was prepared using Dragon Voice Recognition software and may include unintentional dictation errors.  Dr. Tobie Poet [facility]  If 7PM-7AM, please contact overnight-coverage provider If 7AM-7PM, please contact day coverage provider [email or web address]  08/11/2022, 7:35 PM

## 2022-08-11 NOTE — ED Triage Notes (Signed)
Pt comes via EMs with c/o back pain. Pt states he right foot slipped and she fell onto butt. Pt states right side lumbar pain. Pt states pain with ROM. Pt denies any loc or hitting head.

## 2022-08-11 NOTE — Assessment & Plan Note (Signed)
PPI ?

## 2022-08-11 NOTE — ED Notes (Signed)
See triage note  Presents with pain to lower back  s/p slip and fall

## 2022-08-11 NOTE — ED Notes (Signed)
This tech helped pt change into gown and remove personal clothing. Personal clothing placed in belongings bag.  Purewick placed per pt request.

## 2022-08-12 DIAGNOSIS — W19XXXA Unspecified fall, initial encounter: Secondary | ICD-10-CM

## 2022-08-12 DIAGNOSIS — L0390 Cellulitis, unspecified: Secondary | ICD-10-CM | POA: Diagnosis present

## 2022-08-12 DIAGNOSIS — S32010A Wedge compression fracture of first lumbar vertebra, initial encounter for closed fracture: Secondary | ICD-10-CM | POA: Diagnosis present

## 2022-08-12 DIAGNOSIS — I959 Hypotension, unspecified: Secondary | ICD-10-CM | POA: Diagnosis not present

## 2022-08-12 DIAGNOSIS — K219 Gastro-esophageal reflux disease without esophagitis: Secondary | ICD-10-CM | POA: Diagnosis present

## 2022-08-12 DIAGNOSIS — Z7951 Long term (current) use of inhaled steroids: Secondary | ICD-10-CM | POA: Diagnosis not present

## 2022-08-12 DIAGNOSIS — F419 Anxiety disorder, unspecified: Secondary | ICD-10-CM | POA: Diagnosis present

## 2022-08-12 DIAGNOSIS — M25562 Pain in left knee: Secondary | ICD-10-CM | POA: Diagnosis present

## 2022-08-12 DIAGNOSIS — M1712 Unilateral primary osteoarthritis, left knee: Secondary | ICD-10-CM | POA: Diagnosis present

## 2022-08-12 DIAGNOSIS — Z79899 Other long term (current) drug therapy: Secondary | ICD-10-CM | POA: Diagnosis not present

## 2022-08-12 DIAGNOSIS — M5416 Radiculopathy, lumbar region: Secondary | ICD-10-CM | POA: Diagnosis present

## 2022-08-12 DIAGNOSIS — M48061 Spinal stenosis, lumbar region without neurogenic claudication: Secondary | ICD-10-CM | POA: Diagnosis present

## 2022-08-12 DIAGNOSIS — M4856XA Collapsed vertebra, not elsewhere classified, lumbar region, initial encounter for fracture: Secondary | ICD-10-CM | POA: Diagnosis present

## 2022-08-12 DIAGNOSIS — Z6841 Body Mass Index (BMI) 40.0 and over, adult: Secondary | ICD-10-CM | POA: Diagnosis not present

## 2022-08-12 DIAGNOSIS — T502X5A Adverse effect of carbonic-anhydrase inhibitors, benzothiadiazides and other diuretics, initial encounter: Secondary | ICD-10-CM | POA: Diagnosis not present

## 2022-08-12 DIAGNOSIS — F17210 Nicotine dependence, cigarettes, uncomplicated: Secondary | ICD-10-CM | POA: Diagnosis present

## 2022-08-12 DIAGNOSIS — F32A Depression, unspecified: Secondary | ICD-10-CM | POA: Diagnosis present

## 2022-08-12 DIAGNOSIS — E876 Hypokalemia: Secondary | ICD-10-CM | POA: Diagnosis not present

## 2022-08-12 DIAGNOSIS — W010XXA Fall on same level from slipping, tripping and stumbling without subsequent striking against object, initial encounter: Secondary | ICD-10-CM | POA: Diagnosis present

## 2022-08-12 DIAGNOSIS — R52 Pain, unspecified: Secondary | ICD-10-CM | POA: Diagnosis not present

## 2022-08-12 DIAGNOSIS — M5432 Sciatica, left side: Secondary | ICD-10-CM | POA: Diagnosis present

## 2022-08-12 DIAGNOSIS — I10 Essential (primary) hypertension: Secondary | ICD-10-CM | POA: Diagnosis present

## 2022-08-12 DIAGNOSIS — K5900 Constipation, unspecified: Secondary | ICD-10-CM | POA: Diagnosis present

## 2022-08-12 DIAGNOSIS — Z882 Allergy status to sulfonamides status: Secondary | ICD-10-CM | POA: Diagnosis not present

## 2022-08-12 DIAGNOSIS — M25561 Pain in right knee: Secondary | ICD-10-CM | POA: Diagnosis present

## 2022-08-12 DIAGNOSIS — R339 Retention of urine, unspecified: Secondary | ICD-10-CM | POA: Diagnosis present

## 2022-08-12 LAB — CBC
HCT: 39.7 % (ref 36.0–46.0)
Hemoglobin: 12.9 g/dL (ref 12.0–15.0)
MCH: 29.1 pg (ref 26.0–34.0)
MCHC: 32.5 g/dL (ref 30.0–36.0)
MCV: 89.4 fL (ref 80.0–100.0)
Platelets: 242 10*3/uL (ref 150–400)
RBC: 4.44 MIL/uL (ref 3.87–5.11)
RDW: 13.4 % (ref 11.5–15.5)
WBC: 7.1 10*3/uL (ref 4.0–10.5)
nRBC: 0 % (ref 0.0–0.2)

## 2022-08-12 LAB — BASIC METABOLIC PANEL
Anion gap: 8 (ref 5–15)
BUN: 14 mg/dL (ref 6–20)
CO2: 28 mmol/L (ref 22–32)
Calcium: 8.8 mg/dL — ABNORMAL LOW (ref 8.9–10.3)
Chloride: 103 mmol/L (ref 98–111)
Creatinine, Ser: 0.82 mg/dL (ref 0.44–1.00)
GFR, Estimated: >60 mL/min (ref >60)
Glucose, Bld: 131 mg/dL — ABNORMAL HIGH (ref 70–99)
Potassium: 3.5 mmol/L (ref 3.5–5.1)
Sodium: 139 mmol/L (ref 135–145)

## 2022-08-12 LAB — HIV ANTIBODY (ROUTINE TESTING W REFLEX): HIV Screen 4th Generation wRfx: NONREACTIVE

## 2022-08-12 MED ORDER — MORPHINE SULFATE (PF) 2 MG/ML IV SOLN
2.0000 mg | INTRAVENOUS | Status: DC | PRN
  Administered 2022-08-12 – 2022-08-13 (×4): 2 mg via INTRAVENOUS
  Filled 2022-08-12 (×4): qty 1

## 2022-08-12 MED ORDER — OXYCODONE-ACETAMINOPHEN 5-325 MG PO TABS
1.0000 | ORAL_TABLET | Freq: Four times a day (QID) | ORAL | Status: DC | PRN
  Administered 2022-08-12 – 2022-08-14 (×6): 2 via ORAL
  Filled 2022-08-12 (×6): qty 2

## 2022-08-12 MED ORDER — BUSPIRONE HCL 10 MG PO TABS: 10.0000 mg | ORAL_TABLET | Freq: Two times a day (BID) | ORAL | Status: DC

## 2022-08-12 MED ORDER — MORPHINE SULFATE (PF) 2 MG/ML IV SOLN
2.0000 mg | INTRAVENOUS | Status: AC | PRN
  Administered 2022-08-12: 2 mg via INTRAVENOUS
  Filled 2022-08-12: qty 1

## 2022-08-12 MED ORDER — KETOROLAC TROMETHAMINE 15 MG/ML IJ SOLN
15.0000 mg | Freq: Three times a day (TID) | INTRAMUSCULAR | Status: AC
  Administered 2022-08-12 – 2022-08-13 (×3): 15 mg via INTRAVENOUS
  Filled 2022-08-12 (×3): qty 1

## 2022-08-12 MED ORDER — ORAL CARE MOUTH RINSE: 15.0000 mL | OROMUCOSAL | Status: DC | PRN

## 2022-08-12 MED ORDER — BUSPIRONE HCL 10 MG PO TABS
10.0000 mg | ORAL_TABLET | Freq: Two times a day (BID) | ORAL | Status: DC | PRN
  Administered 2022-08-16 – 2022-08-26 (×13): 10 mg via ORAL
  Filled 2022-08-12 (×14): qty 1

## 2022-08-12 NOTE — Progress Notes (Signed)
PROGRESS NOTE    Brittany Cobb  [id number] DOB: [birth year] DOA: 08/11/2022 PCP: Marzetta Board, DO   Brief Narrative: 52 year old with past medical history significant for depression, anxiety, hypertension, GERD, morbid obesity, history of urinary retention, who presents to the ED complaining of acute low back pain.  She slipped and fell onto her back  and had difficulty ambulating since.  She was found to have Acute appearing superior endplate compression deformity at L1 with approximately 40% height loss. No evidence of retropulsion. No evidence of an epidural hematoma.  Moderate spinal canal stenosis at L4-L5 secondary to combination of a small disc bulge and ligamentum flavum hypertrophy. Moderate right-sided neural foraminal stenosis at L4-L5.   Assessment & Plan:   Principal Problem:   Inadequate pain control Active Problems:   Obesity, Class III, BMI 40-49.9 (morbid obesity) (HCC)   Essential hypertension   GERD (gastroesophageal reflux disease)   Anxiety   Grieving   1-Back pain: L1 Compression Fracture.  CT lumbar spine: Acute appearing superior endplate compression deformity at L1 with approximately 40% height loss. No evidence of retropulsion. No evidence of an epidural hematoma.  Moderate spinal canal stenosis at L4-L5 secondary to combination of a small disc bulge and ligamentum flavum hypertrophy. Moderate right-sided neural foraminal stenosis at L4-L5. -neurosurgery recommend pain management and LSO brace.  -Pain not controlled, continue with morphine -lidocaine Pacth.  -Will add Toradol IV 3 doses.  -Change hydrocodone to oxycodone.  -PT /OT consulted  2-Anxiety: Continue with trazodone, Venlafaxine  and lorazepam as needed  3-GERD: Continue with PPI. 4-Essential hypertension:  continue with amlodipine and furosemide.  As needed hydralazine 5-Obesity class III BMI 49: Need lifestyle modification    Grieving - Her mother passed in November 2023 from  fistula, sepsis    Estimated body mass index is 49.42 kg/m as calculated from the following:   Height as of this encounter: 5' 8"$  (1.727 m).   Weight as of this encounter: 147.4 kg.   DVT prophylaxis: Lovenox Code Status: Full code Family Communication: Care discussed with patient.  Disposition Plan:  Status is: Inpatient Remains inpatient appropriate because: Management of back pain     Consultants:  Neurosurgery   Procedures:  none  Antimicrobials:    Subjective: She is still having back pain. Pain is not controlled with hydrocodone.  She lives alone at home.    Objective: Vitals:   08/11/22 2059 08/12/22 0033 08/12/22 0430 08/12/22 0736  BP: 132/71 133/65 (!) 123/58 (!) 111/58  Pulse: 75 88 90 79  Resp: 20 18 18 16  $ Temp: 98.5 F (36.9 C) 99.3 F (37.4 C) 99.3 F (37.4 C) 98 F (36.7 C)  TempSrc:  Oral Oral   SpO2: 100% 97% 96% 100%  Weight:      Height:        Intake/Output Summary (Last 24 hours) at 08/12/2022 1442 Last data filed at 08/12/2022 1425 Gross per 24 hour  Intake 360 ml  Output --  Net 360 ml   Filed Weights   08/11/22 1218  Weight: (!) 147.4 kg    Examination:  General exam: Appears calm and comfortable  Respiratory system: Clear to auscultation. Respiratory effort normal. Cardiovascular system: S1 & S2 heard, RRR. No JVD, murmurs, rubs, gallops or clicks. No pedal edema. Gastrointestinal system: Abdomen is nondistended, soft and nontender. No organomegaly or masses felt. Normal bowel sounds heard. Central nervous system: Alert and oriented.  Extremities: Symmetric 5 x 5 power.   Data Reviewed: I have  personally reviewed following labs and imaging studies  CBC: Recent Labs  Lab 08/11/22 1455 08/12/22 0416  WBC 7.9 7.1  NEUTROABS 5.0  --   HGB 13.5 12.9  HCT 41.1 39.7  MCV 88.6 89.4  PLT 243 [id number]   Basic Metabolic Panel: Recent Labs  Lab 08/11/22 1455 08/12/22 0416  NA 141 139  K 3.6 3.5  CL 106 103  CO2 27 28   GLUCOSE 97 131*  BUN 11 14  CREATININE 0.65 0.82  CALCIUM 8.9 8.8*   GFR: Estimated Creatinine Clearance: 123.3 mL/min (by C-G formula based on SCr of 0.82 mg/dL). Liver Function Tests: No results for input(s): "AST", "ALT", "ALKPHOS", "BILITOT", "PROT", "ALBUMIN" in the last 168 hours. No results for input(s): "LIPASE", "AMYLASE" in the last 168 hours. No results for input(s): "AMMONIA" in the last 168 hours. Coagulation Profile: No results for input(s): "INR", "PROTIME" in the last 168 hours. Cardiac Enzymes: No results for input(s): "CKTOTAL", "CKMB", "CKMBINDEX", "TROPONINI" in the last 168 hours. BNP (last 3 results) No results for input(s): "PROBNP" in the last 8760 hours. HbA1C: No results for input(s): "HGBA1C" in the last 72 hours. CBG: No results for input(s): "GLUCAP" in the last 168 hours. Lipid Profile: No results for input(s): "CHOL", "HDL", "LDLCALC", "TRIG", "CHOLHDL", "LDLDIRECT" in the last 72 hours. Thyroid Function Tests: No results for input(s): "TSH", "T4TOTAL", "FREET4", "T3FREE", "THYROIDAB" in the last 72 hours. Anemia Panel: No results for input(s): "VITAMINB12", "FOLATE", "FERRITIN", "TIBC", "IRON", "RETICCTPCT" in the last 72 hours. Sepsis Labs: No results for input(s): "PROCALCITON", "LATICACIDVEN" in the last 168 hours.  No results found for this or any previous visit (from the past 240 hour(s)).       Radiology Studies: CT Thoracic Spine Wo Contrast  Result Date: 08/11/2022 CLINICAL DATA:  Back pain after fall with known L1 vertebral body compression fracture EXAM: CT THORACIC SPINE WITHOUT CONTRAST TECHNIQUE: Multidetector CT images of the thoracic were obtained using the standard protocol without intravenous contrast. RADIATION DOSE REDUCTION: This exam was performed according to the departmental dose-optimization program which includes automated exposure control, adjustment of the mA and/or kV according to patient size and/or use of iterative  reconstruction technique. COMPARISON:  Same day lumbar spine CT FINDINGS: Alignment: Preserved thoracic kyphosis without static listhesis. Vertebrae: Acute superior endplate compression fracture of the L1 vertebral body, as described on dedicated CT of the lumbar spine. Thoracic vertebral body heights are maintained without evidence of fracture. No pathologic bone process. Paraspinal and other soft tissues: Negative. Disc levels: Thoracic intervertebral disc heights are preserved. Minimal facet joint hypertrophy within the lower thoracic spine. IMPRESSION: 1. No acute osseous abnormality of the thoracic spine. 2. Acute superior endplate compression fracture of the L1 vertebral body, as described on dedicated CT of the lumbar spine. Electronically Signed   By: Davina Poke D.O.   On: 08/11/2022 16:08   CT Knee Right Wo Contrast  Result Date: 08/11/2022 CLINICAL DATA:  Knee trauma, no prior imaging (Age >= 5y) Unable to ambulate EXAM: CT OF THE RIGHT KNEE WITHOUT CONTRAST TECHNIQUE: Multidetector CT imaging of the right knee was performed according to the standard protocol. Multiplanar CT image reconstructions were also generated. RADIATION DOSE REDUCTION: This exam was performed according to the departmental dose-optimization program which includes automated exposure control, adjustment of the mA and/or kV according to patient size and/or use of iterative reconstruction technique. COMPARISON:  None Available. FINDINGS: Bones/Joint/Cartilage There is no evidence of acute fracture. There is tricompartment osteophyte formation with  moderate-severe medial compartment joint space narrowing. There is a trace joint effusion. There is a small ossified joint body in the superolateral knee measuring 6 mm. Ligaments Suboptimally assessed by CT. Muscles and Tendons No acute myotendinous abnormality by CT. Soft tissues No focal fluid collection. IMPRESSION: No evidence of acute fracture. Tricompartment osteoarthritis,  moderate-severe in the medial compartment. Trace joint effusion. Electronically Signed   By: Maurine Simmering M.D.   On: 08/11/2022 14:09   CT Lumbar Spine Wo Contrast  Result Date: 08/11/2022 CLINICAL DATA:  Back pain. EXAM: CT LUMBAR SPINE WITHOUT CONTRAST TECHNIQUE: Multidetector CT imaging of the lumbar spine was performed without intravenous contrast administration. Multiplanar CT image reconstructions were also generated. RADIATION DOSE REDUCTION: This exam was performed according to the departmental dose-optimization program which includes automated exposure control, adjustment of the mA and/or kV according to patient size and/or use of iterative reconstruction technique. COMPARISON:  None Available. FINDINGS: Segmentation: 5 lumbar type vertebrae. Alignment: Trace anterolisthesis of L4 on L5. Vertebrae: There is an acute appearing superior endplate compression deformity at L1. There is no evidence of retropulsion. There is a proximally 40% height loss. No evidence of an epidural hematoma. Paraspinal and other soft tissues: Small left interpolar renal stone. No hydronephrosis. Disc levels: Moderate spinal canal stenosis at L4-L5 secondary to combination of a small disc bulge and ligamentum flavum hypertrophy. There is also moderate right-sided neural foraminal stenosis at L4-L5. IMPRESSION: 1. Acute appearing superior endplate compression deformity at L1 with approximately 40% height loss. No evidence of retropulsion. No evidence of an epidural hematoma. 2. Moderate spinal canal stenosis at L4-L5 secondary to combination of a small disc bulge and ligamentum flavum hypertrophy. Moderate right-sided neural foraminal stenosis at L4-L5. Electronically Signed   By: Marin Roberts M.D.   On: 08/11/2022 14:00        Scheduled Meds:  amLODipine  5 mg Oral Daily   enoxaparin (LOVENOX) injection  0.5 mg/kg Subcutaneous Q24H   furosemide  20 mg Oral BID   ketorolac  15 mg Intravenous Q8H   lamoTRIgine  200 mg  Oral QHS   lidocaine  2 patch Transdermal Q24H   mometasone-formoterol  2 puff Inhalation BID   pantoprazole  40 mg Oral BID   topiramate  50 mg Oral QHS   traZODone  50 mg Oral QHS   valACYclovir  1,000 mg Oral Daily   venlafaxine XR  150 mg Oral Daily   Continuous Infusions:   LOS: 0 days    Time spent: 35 minutes    Modean Mccullum A Rudi Bunyard, MD [facility]   If 7PM-7AM, please contact night-coverage [email or web address]  08/12/2022, 2:42 PM

## 2022-08-12 NOTE — Progress Notes (Signed)
Pr has arrived to 1A room 160.  Alert and oriented x 4, identified appropriately. VS stable, denied chest pain and SOB, no signs of distress. Pt oriented to room and equipment instructed to call for assistance, and call bell left with in pt reach. Will continue to monitor pt, and treat per MD orders.

## 2022-08-12 NOTE — TOC Progression Note (Signed)
Transition of Care [facility]) - Progression Note    Patient Details  Name: Brittany Cobb MRN: [id number] Date of Birth: Dec 30, 1970  Transition of Care [facility]) CM/SW Arabi, RN Phone Number: 08/12/2022, 12:08 PM  Clinical Narrative:    Patient has medicaid, no TOC needs identified  Transition of Care (TOC) Screening Note   Patient Details  Name: Brittany Cobb Date of Birth: [birth year]   Transition of Care [facility]) CM/SW Contact:    Conception Oms, RN Phone Number: 08/12/2022, 12:09 PM    Transition of Care Department [facility]) has reviewed patient and no TOC needs have been identified at this time. We will continue to monitor patient advancement through interdisciplinary progression rounds. If new patient transition needs arise, please place a TOC consult.          Expected Discharge Plan and Services                                               Social Determinants of Health (SDOH) Interventions SDOH Screenings   Food Insecurity: No Food Insecurity (08/12/2022)  Housing: Low Risk  (08/12/2022)  Utilities: Not At Risk (08/12/2022)  Tobacco Use: High Risk (08/11/2022)    Readmission Risk Interventions     No data to display

## 2022-08-12 NOTE — Evaluation (Signed)
Occupational Therapy Evaluation Patient Details Name: Brittany Cobb MRN: [id number] DOB: Feb 27, 1971 Today's Date: 08/12/2022   History of Present Illness Brittany Cobb is a 52 year old female with history of depression, anxiety, hypertension, GERD, morbid obesity, history of urinary retention, with no history of asthma, who presents emergency department for chief concerns of new acute low back pain. Patient slipped and fell on her bottom and has had difficulty ambulating since.  Found to have L1 compression fracture.   Clinical Impression   Brittany Cobb was seen for OT/PT co-evaluation this date. Prior to hospital admission, pt was IND. Pt lives alone in mobile home c ramped entrance. Pt presents to acute OT demonstrating impaired ADL performance and functional mobility 2/2 decreased activity tolerance and functional strength/ROM/balance deficits. Pt currently requires MOD A x2 log rolling to ext bed. MIN A x2 sit<>stand, tolerates ~2 min standing. Reports 10/10 pain, RN in to address. Pt would benefit from skilled OT to address noted impairments and functional limitations (see below for any additional details). Upon hospital discharge, recommend STR however pt may progress with improved pain mgmt.    Recommendations for follow up therapy are one component of a multi-disciplinary discharge planning process, led by the attending physician.  Recommendations may be updated based on patient status, additional functional criteria and insurance authorization.   Follow Up Recommendations  Skilled nursing-short term rehab (<3 hours/day)     Assistance Recommended at Discharge Intermittent Supervision/Assistance  Patient can return home with the following A lot of help with walking and/or transfers;A lot of help with bathing/dressing/bathroom;Help with stairs or ramp for entrance    Functional Status Assessment  Patient has had a recent decline in their functional status and demonstrates the ability to  make significant improvements in function in a reasonable and predictable amount of time.  Equipment Recommendations  BSC/3in1    Recommendations for Other Services       Precautions / Restrictions Precautions Precautions: Fall;Back Precaution Booklet Issued: No Required Braces or Orthoses: Spinal Brace Spinal Brace: Lumbar corset;Applied in sitting position Restrictions Weight Bearing Restrictions: No      Mobility Bed Mobility Overal bed mobility: Needs Assistance Bed Mobility: Rolling, Sidelying to Sit Rolling: Mod assist, +2 for physical assistance Sidelying to sit: Mod assist, +2 for physical assistance, HOB elevated            Transfers Overall transfer level: Needs assistance Equipment used: 2 person hand held assist Transfers: Sit to/from Stand Sit to Stand: From elevated surface, Min assist, +2 physical assistance           General transfer comment: patient is able to stand at sink Pain limited      Balance Overall balance assessment: Needs assistance Sitting-balance support: Feet supported Sitting balance-Leahy Scale: Good     Standing balance support: Bilateral upper extremity supported Standing balance-Leahy Scale: Fair                             ADL either performed or assessed with clinical judgement   ADL Overall ADL's : Needs assistance/impaired                                       General ADL Comments: MOD A don LSO sitting. ANticipate MOD A BSC t/f      Pertinent Vitals/Pain Pain Assessment Pain Assessment: 0-10 Pain Score: 10-Worst pain  ever Pain Location: back and right side with mobility Pain Descriptors / Indicators: Discomfort, Grimacing, Guarding, Moaning Pain Intervention(s): Limited activity within patient's tolerance     Hand Dominance     Extremity/Trunk Assessment Upper Extremity Assessment Upper Extremity Assessment: Overall WFL for tasks assessed   Lower Extremity  Assessment Lower Extremity Assessment: Generalized weakness RLE Deficits / Details: painful right side due to fall. Unable to weight bear on right in standing RLE Coordination: decreased gross motor   Cervical / Trunk Assessment Cervical / Trunk Assessment: Normal   Communication Communication Communication: No difficulties   Cognition Arousal/Alertness: Awake/alert Behavior During Therapy: WFL for tasks assessed/performed Overall Cognitive Status: Within Functional Limits for tasks assessed                                        Home Living Family/patient expects to be discharged to:: Private residence Living Arrangements: Alone Available Help at Discharge: Friend(s);Available PRN/intermittently Type of Home: Mobile home Home Access: Ramped entrance     Home Layout: One level     Bathroom Shower/Tub: Chief Strategy Officer: None          Prior Functioning/Environment Prior Level of Function : Independent/Modified Independent;Driving             Mobility Comments: independent ADLs Comments: independent        OT Problem List: Decreased strength;Decreased range of motion;Decreased activity tolerance;Impaired balance (sitting and/or standing)      OT Treatment/Interventions: Self-care/ADL training;Therapeutic exercise;Energy conservation;DME and/or AE instruction;Therapeutic activities;Patient/family education;Balance training    OT Goals(Current goals can be found in the care plan section) Acute Rehab OT Goals Patient Stated Goal: go home OT Goal Formulation: With patient Time For Goal Achievement: 08/26/22 Potential to Achieve Goals: Good ADL Goals Pt Will Perform Grooming: with modified independence;standing Pt Will Perform Lower Body Dressing: with modified independence;sitting/lateral leans;with adaptive equipment Pt Will Transfer to Toilet: with modified independence;ambulating;regular height toilet  OT Frequency:  Min 2X/week    Co-evaluation PT/OT/SLP Co-Evaluation/Treatment: Yes Reason for Co-Treatment: To address functional/ADL transfers;For patient/therapist safety PT goals addressed during session: Mobility/safety with mobility;Balance        AM-PAC OT "6 Clicks" Daily Activity     Outcome Measure Help from another person eating meals?: None Help from another person taking care of personal grooming?: A Little Help from another person toileting, which includes using toliet, bedpan, or urinal?: A Lot Help from another person bathing (including washing, rinsing, drying)?: A Lot Help from another person to put on and taking off regular upper body clothing?: A Little Help from another person to put on and taking off regular lower body clothing?: A Lot 6 Click Score: 16   End of Session    Activity Tolerance: Patient tolerated treatment well Patient left: in bed;with call bell/phone within reach;with nursing/sitter in room  OT Visit Diagnosis: Other abnormalities of gait and mobility (R26.89);Muscle weakness (generalized) (M62.81)                Time: [id number] OT Time Calculation (min): 25 min Charges:  OT General Charges $OT Visit: 1 Visit OT Evaluation $OT Eval Low Complexity: 1 Low OT Treatments $Self Care/Home Management : 8-22 mins  [name], M.S. OTR/L  08/12/22, 4:35 PM  ascom [phone]

## 2022-08-12 NOTE — Evaluation (Signed)
Physical Therapy Evaluation Patient Details Name: MAPLE MOWAT MRN: [id number] DOB: [birth year] Today's Date: 08/12/2022  History of Present Illness  Ms. Mirranda Ervin is a 52 year old female with history of depression, anxiety, hypertension, GERD, morbid obesity, history of urinary retention, with no history of asthma, who presents emergency department for chief concerns of new acute low back pain. Patient slipped and fell on her bottom and has had difficulty ambulating since.  Found to have L1 compression fracture.  Clinical Impression  Patient received in bed, she is agreeable to PT/OT assessment. She is reporting significant pain in back and entire right side from fall. Patient requires cues and Mod assist +2 to roll to her right side and push up to sitting. Assistance needed to don [facility]. Patient required min/mod + 2 to stand from elevated bed. She is unable to shift weight onto her right LE at this time. Patient will continue to benefit from skilled PT to improve functional independence for return home. I believe she will make good progress and hopeful that she will be able to go home at discharge, but based on today's mobility am recommending SNF for now.          Recommendations for follow up therapy are one component of a multi-disciplinary discharge planning process, led by the attending physician.  Recommendations may be updated based on patient status, additional functional criteria and insurance authorization.  Follow Up Recommendations Skilled nursing-short term rehab (<3 hours/day)      Assistance Recommended at Discharge Intermittent Supervision/Assistance  Patient can return home with the following  A lot of help with walking and/or transfers;A lot of help with bathing/dressing/bathroom;Help with stairs or ramp for entrance;Assist for transportation;Assistance with cooking/housework    Equipment Recommendations Other (comment) (TBD)  Recommendations for Other Services        Functional Status Assessment Patient has had a recent decline in their functional status and demonstrates the ability to make significant improvements in function in a reasonable and predictable amount of time.     Precautions / Restrictions Precautions Precautions: Fall;Back Precaution Booklet Issued: No Required Braces or Orthoses: Spinal Brace Spinal Brace: Lumbar corset;Applied in sitting position Restrictions Weight Bearing Restrictions: No      Mobility  Bed Mobility Overal bed mobility: Needs Assistance Bed Mobility: Rolling, Sidelying to Sit Rolling: Mod assist, +2 for physical assistance Sidelying to sit: Mod assist, +2 for physical assistance, HOB elevated            Transfers Overall transfer level: Needs assistance Equipment used: 2 person hand held assist Transfers: Sit to/from Stand Sit to Stand: From elevated surface, Min assist           General transfer comment: patient is able to stand at sink, +2 min/mod assist to boost up. Pain limited    Ambulation/Gait               General Gait Details: unable at this time  Stairs            Wheelchair Mobility    Modified Rankin (Stroke Patients Only)       Balance Overall balance assessment: Needs assistance Sitting-balance support: Feet supported Sitting balance-Leahy Scale: Good     Standing balance support: Bilateral upper extremity supported, During functional activity Standing balance-Leahy Scale: Fair                               Pertinent Vitals/Pain Pain Assessment Pain Assessment:  0-10 Pain Score: 10-Worst pain ever Pain Location: back and right side with mobility Pain Descriptors / Indicators: Discomfort, Grimacing, Guarding, Moaning Pain Intervention(s): Monitored during session, Repositioned    Home Living Family/patient expects to be discharged to:: Private residence Living Arrangements: Alone Available Help at Discharge: Friend(s);Available  PRN/intermittently Type of Home: Mobile home Home Access: Ramped entrance       Home Layout: One level Home Equipment: None      Prior Function Prior Level of Function : Independent/Modified Independent;Driving             Mobility Comments: independent ADLs Comments: independent     Hand Dominance        Extremity/Trunk Assessment   Upper Extremity Assessment Upper Extremity Assessment: Defer to OT evaluation    Lower Extremity Assessment Lower Extremity Assessment: Generalized weakness;RLE deficits/detail RLE Deficits / Details: painful right side due to fall. Unable to weight bear on right in standing RLE Coordination: decreased gross motor    Cervical / Trunk Assessment Cervical / Trunk Assessment: Normal  Communication   Communication: No difficulties  Cognition Arousal/Alertness: Awake/alert Behavior During Therapy: WFL for tasks assessed/performed Overall Cognitive Status: Within Functional Limits for tasks assessed                                          General Comments      Exercises     Assessment/Plan    PT Assessment Patient needs continued PT services  PT Problem List Pain;Decreased activity tolerance;Decreased mobility;Obesity;Decreased knowledge of use of DME       PT Treatment Interventions DME instruction;Gait training;Functional mobility training;Therapeutic activities;Patient/family education;Therapeutic exercise;Modalities    PT Goals (Current goals can be found in the Care Plan section)  Acute Rehab PT Goals Patient Stated Goal: to return home PT Goal Formulation: With patient Time For Goal Achievement: 08/20/22 Potential to Achieve Goals: Good    Frequency 7X/week     Co-evaluation PT/OT/SLP Co-Evaluation/Treatment: Yes Reason for Co-Treatment: To address functional/ADL transfers;For patient/therapist safety PT goals addressed during session: Mobility/safety with mobility;Balance         AM-PAC  PT "6 Clicks" Mobility  Outcome Measure Help needed turning from your back to your side while in a flat bed without using bedrails?: A Lot Help needed moving from lying on your back to sitting on the side of a flat bed without using bedrails?: A Lot Help needed moving to and from a bed to a chair (including a wheelchair)?: Total Help needed standing up from a chair using your arms (e.g., wheelchair or bedside chair)?: A Lot Help needed to walk in hospital room?: Total Help needed climbing 3-5 steps with a railing? : Total 6 Click Score: 9    End of Session Equipment Utilized During Treatment: Back brace Activity Tolerance: Patient limited by pain Patient left: Other (comment);with nursing/sitter in room (seated on edge of bed) Nurse Communication: Mobility status PT Visit Diagnosis: Other abnormalities of gait and mobility (R26.89);History of falling (Z91.81);Difficulty in walking, not elsewhere classified (R26.2);Pain Pain - Right/Left: Right Pain - part of body: Knee (back)    Time: [id number] PT Time Calculation (min) (ACUTE ONLY): 24 min   Charges:   PT Evaluation $PT Eval Moderate Complexity: 1 Mod          Sharif Rendell, PT, GCS 08/12/22,3:50 PM

## 2022-08-12 NOTE — Consult Note (Addendum)
Consult requested by:  Mardee Postin  Consult requested for:  L1 fracture  Primary Physician:  Marzetta Board, DO  History of Present Illness: 08/12/2022 Brittany Cobb is here today with a chief complaint of mechanical fall.  She slipped walking out her front door.  She had immediate onset of back pain.  She denies any other neurologic symptoms.  She was brought to the emergency department for evaluation where an L1 compression fracture was noted.  She was placed in an LSO brace and admitted for pain control.  She is also having right knee discomfort as she was in an abnormal position for some period of time before help arrived.   I have utilized the care everywhere function in epic to review the outside records available from external health systems.  Review of Systems:  A 10 point review of systems is negative, except for the pertinent positives and negatives detailed in the HPI.  Past Medical History: Past Medical History:  Diagnosis Date   Acid reflux    Anxiety    Depression     Past Surgical History: Past Surgical History:  Procedure Laterality Date   BREAST BIOPSY Left 2014   benign with s shaped clip   ENDOMETRIAL ABLATION     KNEE SURGERY Left    TONSILLECTOMY     TUBAL LIGATION      Allergies: Allergies as of 08/11/2022 - Review Complete 08/11/2022  Allergen Reaction Noted   Bactrim [sulfamethoxazole-trimethoprim] Hives 03/26/2015    Medications: Current Meds  Medication Sig   amLODipine (NORVASC) 5 MG tablet Take 5 mg by mouth daily.   budesonide-formoterol (SYMBICORT) 160-4.5 MCG/ACT inhaler Inhale 2 puffs into the lungs 2 (two) times daily.   busPIRone (BUSPAR) 15 MG tablet Take 15 mg by mouth 2 (two) times daily as needed.   furosemide (LASIX) 20 MG tablet Take 20 mg by mouth 2 (two) times daily.   HYDROcodone-acetaminophen (NORCO) 10-325 MG tablet Take 1 tablet by mouth every 8 (eight) hours as needed for moderate pain or severe pain.    lamoTRIgine (LAMICTAL) 200 MG tablet Take 200 mg by mouth at bedtime.   omeprazole (PRILOSEC) 20 MG capsule Take 20 mg by mouth in the morning and at bedtime.   topiramate (TOPAMAX) 50 MG tablet Take 50 mg by mouth at bedtime.   traZODone (DESYREL) 50 MG tablet Take 50 mg by mouth at bedtime.   valACYclovir (VALTREX) 1000 MG tablet Take 1,000 mg by mouth daily.   venlafaxine XR (EFFEXOR-XR) 150 MG 24 hr capsule Take 1 capsule by mouth daily.   VENTOLIN HFA 108 (90 Base) MCG/ACT inhaler     Social History: Social History   Tobacco Use   Smoking status: Every Day    Packs/day: 0.50    Types: Cigarettes   Smokeless tobacco: Never  Substance Use Topics   Alcohol use: No   Drug use: No    Family Medical History: History reviewed. No pertinent family history.  Physical Examination: Vitals:   08/12/22 0430 08/12/22 0736  BP: (!) 123/58 (!) 111/58  Pulse: 90 79  Resp: 18 16  Temp: 99.3 F (37.4 C) 98 F (36.7 C)  SpO2: 96% 100%    General: Patient is well developed, well nourished, calm, collected, and in no apparent distress. Attention to examination is appropriate.  Neck:   Supple.  Full range of motion.  Respiratory: Patient is breathing without any difficulty.   NEUROLOGICAL:     Awake, alert, oriented to  person, place, and time.  Speech is clear and fluent.  Cranial Nerves: Pupils equal round and reactive to light.  Facial tone is symmetric.  Facial sensation is symmetric. Shoulder shrug is symmetric. Tongue protrusion is midline.  There is no pronator drift.  ROM of spine: untested.    Strength: Side Biceps Triceps Deltoid Interossei Grip Wrist Ext. Wrist Flex.  R 5 5 5 5 5 5 5  $ L 5 5 5 5 5 5 5   $ Side Iliopsoas Quads Hamstring PF DF EHL  R 5 5 5 5 5 5  $ L 5 5 5 5 5 5   $ Hoffman's is absent.   Bilateral upper and lower extremity sensation is intact to light touch.    No evidence of dysmetria noted.  Gait is untested.     Medical Decision  Making  Imaging: CT L spine 08/11/2022 IMPRESSION: 1. Acute appearing superior endplate compression deformity at L1 with approximately 40% height loss. No evidence of retropulsion. No evidence of an epidural hematoma. 2. Moderate spinal canal stenosis at L4-L5 secondary to combination of a small disc bulge and ligamentum flavum hypertrophy. Moderate right-sided neural foraminal stenosis at L4-L5.     Electronically Signed   By: Marin Roberts M.D.   On: 08/11/2022 14:00  I have personally reviewed the images and agree with the above interpretation.  Assessment and Plan: [name] is a pleasant 52 y.o. female with L1 compression fracture.  She has no neurologic symptoms.  This is stable fracture.  I recommended that she be treated with an LSO brace when out of bed.  She is fine to work with PT and OT.  Will see her in clinic in a couple of weeks.  It is very unlikely she will need surgical intervention.    I have communicated my recommendations to the requesting physician and coordinated care to facilitate these recommendations.     Jalien Weakland K. Izora Ribas MD, [facility] Neurosurgery

## 2022-08-13 DIAGNOSIS — R52 Pain, unspecified: Secondary | ICD-10-CM | POA: Diagnosis not present

## 2022-08-13 MED ORDER — KETOROLAC TROMETHAMINE 15 MG/ML IJ SOLN
15.0000 mg | Freq: Three times a day (TID) | INTRAMUSCULAR | Status: AC
  Administered 2022-08-13 – 2022-08-14 (×3): 15 mg via INTRAVENOUS
  Filled 2022-08-13 (×3): qty 1

## 2022-08-13 MED ORDER — SENNOSIDES-DOCUSATE SODIUM 8.6-50 MG PO TABS
1.0000 | ORAL_TABLET | Freq: Two times a day (BID) | ORAL | Status: DC
  Administered 2022-08-13 – 2022-08-25 (×22): 1 via ORAL
  Filled 2022-08-13 (×28): qty 1

## 2022-08-13 MED ORDER — POLYETHYLENE GLYCOL 3350 17 G PO PACK
17.0000 g | PACK | Freq: Every day | ORAL | Status: DC
  Administered 2022-08-13: 17 g via ORAL
  Filled 2022-08-13: qty 1

## 2022-08-13 MED ORDER — POLYETHYLENE GLYCOL 3350 17 G PO PACK
17.0000 g | PACK | Freq: Two times a day (BID) | ORAL | Status: DC
  Administered 2022-08-13 – 2022-08-24 (×15): 17 g via ORAL
  Filled 2022-08-13 (×23): qty 1

## 2022-08-13 NOTE — Plan of Care (Signed)
  Problem: Clinical Measurements: Goal: Ability to maintain clinical measurements within normal limits will improve Outcome: Progressing   

## 2022-08-13 NOTE — Progress Notes (Signed)
Occupational Therapy Treatment Patient Details Name: Brittany Cobb MRN: [id number] DOB: [birth year] Today's Date: 08/13/2022   History of present illness Brittany Cobb is a 52 year old female with history of depression, anxiety, hypertension, GERD, morbid obesity, history of urinary retention, with no history of asthma, who presents emergency department for chief concerns of new acute low back pain. Patient slipped and fell on her bottom and has had difficulty ambulating since.  Found to have L1 compression fracture.   OT comments  [name] was seen for OT/PT co-treatment on this date. Upon arrival to room pt reclined in bed, agreeable to tx. Pt requires CGA exit bed. MAX A don B socks seated EOB. MOD A don LSO seated. MIN A +RW sit<>stand, fair static balance however unable to tolerated pivoting. Pt making good progress toward goals, will continue to follow POC. Discharge recommendation remains appropriate.     Recommendations for follow up therapy are one component of a multi-disciplinary discharge planning process, led by the attending physician.  Recommendations may be updated based on patient status, additional functional criteria and insurance authorization.    Follow Up Recommendations  Skilled nursing-short term rehab (<3 hours/day)     Assistance Recommended at Discharge Intermittent Supervision/Assistance  Patient can return home with the following  A lot of help with walking and/or transfers;A lot of help with bathing/dressing/bathroom;Help with stairs or ramp for entrance   Equipment Recommendations  BSC/3in1    Recommendations for Other Services      Precautions / Restrictions Precautions Precautions: Fall;Back Precaution Booklet Issued: No Required Braces or Orthoses: Spinal Brace Spinal Brace: Lumbar corset;Applied in sitting position Restrictions Weight Bearing Restrictions: No       Mobility Bed Mobility Overal bed mobility: Needs Assistance Bed Mobility:  Supine to Sit, Sit to Supine Rolling: Min guard Sidelying to sit: Min guard   Sit to supine: Min assist        Transfers Overall transfer level: Needs assistance Equipment used: Rolling walker (2 wheels) Transfers: Sit to/from Stand Sit to Stand: From elevated surface, Min assist, +2 safety/equipment                 Balance Overall balance assessment: Needs assistance Sitting-balance support: Feet supported Sitting balance-Leahy Scale: Good     Standing balance support: Bilateral upper extremity supported, During functional activity, Reliant on assistive device for balance Standing balance-Leahy Scale: Good                             ADL either performed or assessed with clinical judgement   ADL Overall ADL's : Needs assistance/impaired                                       General ADL Comments: MAX A don B socks seated EOB. MOD A don LSO seated. Unable to tolerated BSC t/f      Cognition Arousal/Alertness: Awake/alert Behavior During Therapy: WFL for tasks assessed/performed Overall Cognitive Status: Within Functional Limits for tasks assessed                                                     Pertinent Vitals/ Pain       Pain Assessment Pain  Assessment: 0-10 Pain Score: 10-Worst pain ever Pain Location: back Pain Descriptors / Indicators: Discomfort, Grimacing, Guarding, Moaning Pain Intervention(s): Limited activity within patient's tolerance, Premedicated before session   Frequency  Min 2X/week        Progress Toward Goals  OT Goals(current goals can now be found in the care plan section)  Progress towards OT goals: Progressing toward goals  Acute Rehab OT Goals Patient Stated Goal: to go home OT Goal Formulation: With patient Time For Goal Achievement: 08/26/22 Potential to Achieve Goals: Good ADL Goals Pt Will Perform Grooming: with modified independence;standing Pt Will Perform Lower  Body Dressing: with modified independence;sitting/lateral leans;with adaptive equipment Pt Will Transfer to Toilet: with modified independence;ambulating;regular height toilet  Plan Discharge plan remains appropriate;Frequency remains appropriate    Co-evaluation    PT/OT/SLP Co-Evaluation/Treatment: Yes Reason for Co-Treatment: For patient/therapist safety;To address functional/ADL transfers PT goals addressed during session: Mobility/safety with mobility;Balance;Proper use of DME OT goals addressed during session: ADL's and self-care      AM-PAC OT "6 Clicks" Daily Activity     Outcome Measure   Help from another person eating meals?: None Help from another person taking care of personal grooming?: A Little Help from another person toileting, which includes using toliet, bedpan, or urinal?: A Lot Help from another person bathing (including washing, rinsing, drying)?: A Lot Help from another person to put on and taking off regular upper body clothing?: A Little Help from another person to put on and taking off regular lower body clothing?: A Lot 6 Click Score: 16    End of Session    OT Visit Diagnosis: Other abnormalities of gait and mobility (R26.89);Muscle weakness (generalized) (M62.81)   Activity Tolerance Patient tolerated treatment well   Patient Left in bed;with call bell/phone within reach   Nurse Communication Mobility status        Time: [id number] OT Time Calculation (min): 32 min  Charges: OT General Charges $OT Visit: 1 Visit OT Treatments $Self Care/Home Management : 8-22 mins   [name], M.S. OTR/L  08/13/22, 12:28 PM  ascom [phone]

## 2022-08-13 NOTE — Progress Notes (Signed)
PROGRESS NOTE    Brittany Cobb  [id number] DOB: Jul 20, 1970 DOA: 08/11/2022 PCP: Marzetta Board, DO   Brief Narrative: 52 year old with past medical history significant for depression, anxiety, hypertension, GERD, morbid obesity, history of urinary retention, who presents to the ED complaining of acute low back pain.  She slipped and fell onto her back  and had difficulty ambulating since.    She was found to have Acute appearing superior endplate compression deformity at L1 with approximately 40% height loss. No evidence of retropulsion. No evidence of an epidural hematoma.  Moderate spinal canal stenosis at L4-L5 secondary to combination of a small disc bulge and ligamentum flavum hypertrophy. Moderate right-sided neural foraminal stenosis at L4-L5.   Assessment & Plan:   Principal Problem:   Inadequate pain control Active Problems:   Obesity, Class III, BMI 40-49.9 (morbid obesity) (HCC)   Essential hypertension   GERD (gastroesophageal reflux disease)   Anxiety   Grieving   Compression fracture of L1 lumbar vertebra, closed, initial encounter ([place])   1-Back pain: L1 Compression Fracture:  CT lumbar spine: Acute appearing superior endplate compression deformity at L1 with approximately 40% height loss. No evidence of retropulsion. No evidence of an epidural hematoma.  Moderate spinal canal stenosis at L4-L5 secondary to combination of a small disc bulge and ligamentum flavum hypertrophy. Moderate right-sided neural foraminal stenosis at L4-L5. -neurosurgery recommend pain management and LSO brace.  -Pain not controlled, continue with morphine -lidocaine Pacth.  -Repeat  Toradol IV 3 doses.  -Continue with oxycodone.  -PT /OT consulted, recommend SNF< patient unable to afford SNF> and would like to go home.  Plan to continue with PT and pain management for Another 24 hours inpatient. Unable to ambulate yet due to pain   2-Anxiety: Continue with trazodone, Venlafaxine  and  lorazepam as needed  3-GERD: Continue with PPI. 4-Essential hypertension:  continue with amlodipine and furosemide.  As needed hydralazine 5-Obesity class III BMI 49: Need lifestyle modification Constipation: Start Miralax, senna.     Grieving - Her mother passed in November 2023 from fistula, sepsis    Estimated body mass index is 49.42 kg/m as calculated from the following:   Height as of this encounter: 5' 8"$  (1.727 m).   Weight as of this encounter: 147.4 kg.   DVT prophylaxis: Lovenox Code Status: Full code Family Communication: Care discussed with patient.  Disposition Plan:  Status is: Inpatient Remains inpatient appropriate because: Management of back pain     Consultants:  Neurosurgery   Procedures:  none  Antimicrobials:    Subjective: Still having back pain. Requiring IV pain meds.  She was only able to sit bed side.  No BM    Objective: Vitals:   08/12/22 0736 08/12/22 1900 08/12/22 2356 08/13/22 0810  BP: (!) 111/58 120/62 (!) 121/59 120/62  Pulse: 79 76 78 95  Resp: 16  20 20  $ Temp: 98 F (36.7 C)  98.7 F (37.1 C) 98.7 F (37.1 C)  TempSrc:    Oral  SpO2: 100% 97% 94% 93%  Weight:      Height:        Intake/Output Summary (Last 24 hours) at 08/13/2022 1512 Last data filed at 08/13/2022 1401 Gross per 24 hour  Intake 340 ml  Output 2250 ml  Net -1910 ml    Filed Weights   08/11/22 1218  Weight: (!) 147.4 kg    Examination:  General exam: NAD Respiratory system: CTA Cardiovascular system: S 1. S 2 RRR Gastrointestinal  system: BS present, soft, nt Central nervous system: alert  Extremities: no edema  Data Reviewed: I have personally reviewed following labs and imaging studies  CBC: Recent Labs  Lab 08/11/22 1455 08/12/22 0416  WBC 7.9 7.1  NEUTROABS 5.0  --   HGB 13.5 12.9  HCT 41.1 39.7  MCV 88.6 89.4  PLT 243 [id number]    Basic Metabolic Panel: Recent Labs  Lab 08/11/22 1455 08/12/22 0416  NA 141 139  K 3.6  3.5  CL 106 103  CO2 27 28  GLUCOSE 97 131*  BUN 11 14  CREATININE 0.65 0.82  CALCIUM 8.9 8.8*    GFR: Estimated Creatinine Clearance: 123.3 mL/min (by C-G formula based on SCr of 0.82 mg/dL). Liver Function Tests: No results for input(s): "AST", "ALT", "ALKPHOS", "BILITOT", "PROT", "ALBUMIN" in the last 168 hours. No results for input(s): "LIPASE", "AMYLASE" in the last 168 hours. No results for input(s): "AMMONIA" in the last 168 hours. Coagulation Profile: No results for input(s): "INR", "PROTIME" in the last 168 hours. Cardiac Enzymes: No results for input(s): "CKTOTAL", "CKMB", "CKMBINDEX", "TROPONINI" in the last 168 hours. BNP (last 3 results) No results for input(s): "PROBNP" in the last 8760 hours. HbA1C: No results for input(s): "HGBA1C" in the last 72 hours. CBG: No results for input(s): "GLUCAP" in the last 168 hours. Lipid Profile: No results for input(s): "CHOL", "HDL", "LDLCALC", "TRIG", "CHOLHDL", "LDLDIRECT" in the last 72 hours. Thyroid Function Tests: No results for input(s): "TSH", "T4TOTAL", "FREET4", "T3FREE", "THYROIDAB" in the last 72 hours. Anemia Panel: No results for input(s): "VITAMINB12", "FOLATE", "FERRITIN", "TIBC", "IRON", "RETICCTPCT" in the last 72 hours. Sepsis Labs: No results for input(s): "PROCALCITON", "LATICACIDVEN" in the last 168 hours.  No results found for this or any previous visit (from the past 240 hour(s)).       Radiology Studies: CT Thoracic Spine Wo Contrast  Result Date: 08/11/2022 CLINICAL DATA:  Back pain after fall with known L1 vertebral body compression fracture EXAM: CT THORACIC SPINE WITHOUT CONTRAST TECHNIQUE: Multidetector CT images of the thoracic were obtained using the standard protocol without intravenous contrast. RADIATION DOSE REDUCTION: This exam was performed according to the departmental dose-optimization program which includes automated exposure control, adjustment of the mA and/or kV according to  patient size and/or use of iterative reconstruction technique. COMPARISON:  Same day lumbar spine CT FINDINGS: Alignment: Preserved thoracic kyphosis without static listhesis. Vertebrae: Acute superior endplate compression fracture of the L1 vertebral body, as described on dedicated CT of the lumbar spine. Thoracic vertebral body heights are maintained without evidence of fracture. No pathologic bone process. Paraspinal and other soft tissues: Negative. Disc levels: Thoracic intervertebral disc heights are preserved. Minimal facet joint hypertrophy within the lower thoracic spine. IMPRESSION: 1. No acute osseous abnormality of the thoracic spine. 2. Acute superior endplate compression fracture of the L1 vertebral body, as described on dedicated CT of the lumbar spine. Electronically Signed   By: Davina Poke D.O.   On: 08/11/2022 16:08        Scheduled Meds:  amLODipine  5 mg Oral Daily   enoxaparin (LOVENOX) injection  0.5 mg/kg Subcutaneous Q24H   furosemide  20 mg Oral BID   ketorolac  15 mg Intravenous Q8H   lamoTRIgine  200 mg Oral QHS   lidocaine  2 patch Transdermal Q24H   mometasone-formoterol  2 puff Inhalation BID   pantoprazole  40 mg Oral BID   polyethylene glycol  17 g Oral Daily   senna-docusate  1  tablet Oral BID   topiramate  50 mg Oral QHS   traZODone  50 mg Oral QHS   valACYclovir  1,000 mg Oral Daily   venlafaxine XR  150 mg Oral Daily   Continuous Infusions:   LOS: 1 day    Time spent: 35 minutes    Uri Covey A Kaleeah Gingerich, MD [facility]   If 7PM-7AM, please contact night-coverage [email or web address]  08/13/2022, 3:12 PM

## 2022-08-13 NOTE — TOC Progression Note (Signed)
Transition of Care (TOC) - Progression Note    Patient Details  Name: Brittany Cobb MRN: [id number] Date of Birth: Nov 30, 1970  Transition of Care [facility]) CM/SW [place], RN Phone Number: 08/13/2022, 9:05 AM  Clinical Narrative:    Spoke with the patient about DC planning and needs She lives in a mobile home  She stated she does not have an income, I asked her how she pays her bills, she stated she has help from people with that, she stated that she has transportation She reports that she needs a Rolling walker and 3 in1, [employer] to deliver to the bedside, she is not going to be able to go to STR due to having medicaid, they are required to agree to stay for 30 days and pay the difference in what Medicaid does not cover, she stated that noone woul dbe able to help with that payment and she does not want to stay 30 days, she requested to go home with Home health She stated that she does have people that help her at home when needed, I explained due to having Medicaid it is often difficult to find a Home health agency and she stated that if needed she could go to outpatient PT, I reached out to [place] [facility] to inquire if they can accept the patient, awaiting a response   Expected Discharge Plan: [place] Barriers to Discharge: Inadequate or no insurance  Expected Discharge Plan and Services   Discharge Planning Services: CM Consult Post Acute Care Choice: Home Health Living arrangements for the past 2 months: Mobile Home                 DME Arranged: [place]ford Rile rolling, 3-N-1 DME Agency: [employer] Date DME Agency Contacted: 08/13/22 Time DME Agency Contacted: [phone] Representative spoke with at DME Agency: [place]: PT, OT [facility] Agency: [place] ([place]) Date [place]: 08/13/22 Time [place]: [phone] Representative spoke with at [place]: [place] ([place])  Interventions SDOH Screenings   Food Insecurity: No Food Insecurity (08/12/2022)  Housing: Low Risk  (08/12/2022)  Utilities: Not At Risk (08/12/2022)  Tobacco Use: High Risk (08/11/2022)    Readmission Risk Interventions     No data to display

## 2022-08-13 NOTE — Progress Notes (Signed)
Patient is not able to walk the distance required to go the bathroom, or he/she is unable to safely negotiate stairs required to access the bathroom.  A 3in1 BSC will alleviate this problem  

## 2022-08-13 NOTE — Plan of Care (Signed)
  Problem: Education: Goal: Knowledge of General Education information will improve Description: Including pain rating scale, medication(s)/side effects and non-pharmacologic comfort measures Outcome: Progressing   Problem: Activity: Goal: Risk for activity intolerance will decrease Outcome: Progressing   Problem: Pain Managment: Goal: General experience of comfort will improve Outcome: Progressing   

## 2022-08-13 NOTE — TOC Progression Note (Signed)
Transition of Care [facility]) - Progression Note    Patient Details  Name: Brittany Cobb MRN: [id number] Date of Birth: [birth year]  Transition of Care [facility]) CM/SW [place], RN Phone Number: 08/13/2022, 11:58 AM  Clinical Narrative:     The patient will not be able to get [facility] services, she will need to go to Outpatient PT, she said that she can get transportation  Expected Discharge Plan: [place] Barriers to Discharge: Inadequate or no insurance  Expected Discharge Plan and Services   Discharge Planning Services: CM Consult Post Acute Care Choice: [place] arrangements for the past 2 months: Mobile Home                 DME Arranged: Walker rolling, 3-N-1 DME Agency: [employer] Date DME Agency Contacted: 08/13/22 Time DME Agency Contacted: [phone] Representative spoke with at DME Agency: [place]: PT, OT [facility] Agency: [place] ([place]) Date [place]: 08/13/22 Time [place]: [phone] Representative spoke with at [place]: [place] Determinants of Health ([place]) Interventions SDOH Screenings   Food Insecurity: No Food Insecurity (08/12/2022)  Housing: Low Risk  (08/12/2022)  Transportation Needs: No Transportation Needs (08/13/2022)  Utilities: Not At Risk (08/12/2022)  Tobacco Use: High Risk (08/11/2022)    Readmission Risk Interventions     No data to display

## 2022-08-13 NOTE — Progress Notes (Signed)
Physical Therapy Treatment Patient Details Name: Brittany Cobb MRN: [id number] DOB: 12/23/1970 Today's Date: 08/13/2022   History of Present Illness [name] is a 52 year old female with history of depression, anxiety, hypertension, GERD, morbid obesity, history of urinary retention, with no history of asthma, who presents emergency department for chief concerns of new acute low back pain. Patient slipped and fell on her bottom and has had difficulty ambulating since.  Found to have L1 compression fracture.    PT Comments    Patient received in bed she is agreeable to try to get up. She demonstrates improved mobility in bed requiring only min guard and use of bed rails to roll to her left side and push up to sitting edge of bed. Patient is able to stand from moderately elevated bed. She is unable to pivot or take a step with L LE as she cannot shift weight to her right LE due to pain. She will continue to benefit from skilled PT to improve functional mobility and independence for return home. ( She is refusing SNF. )       Recommendations for follow up therapy are one component of a multi-disciplinary discharge planning process, led by the attending physician.  Recommendations may be updated based on patient status, additional functional criteria and insurance authorization.  Follow Up Recommendations  Skilled nursing-short term rehab (<3 hours/day) Can patient physically be transported by private vehicle: No   Assistance Recommended at Discharge Intermittent Supervision/Assistance  Patient can return home with the following A lot of help with walking and/or transfers;A lot of help with bathing/dressing/bathroom;Help with stairs or ramp for entrance;Assist for transportation;Assistance with cooking/housework   Equipment Recommendations  Rolling walker (2 wheels);BSC/3in1    Recommendations for Other Services       Precautions / Restrictions Precautions Precautions:  Fall;Back Precaution Booklet Issued: No Required Braces or Orthoses: Spinal Brace Spinal Brace: Lumbar corset;Applied in sitting position Restrictions Weight Bearing Restrictions: No     Mobility  Bed Mobility Overal bed mobility: Needs Assistance Bed Mobility: Supine to Sit, Sit to Supine Rolling: Min guard Sidelying to sit: Min guard   Sit to supine: Min assist        Transfers Overall transfer level: Needs assistance Equipment used: Rolling walker (2 wheels) Transfers: Sit to/from Stand Sit to Stand: From elevated surface, Min assist, +2 physical assistance                Ambulation/Gait               General Gait Details: unable at this time Attempted to pivot and take a step and she was unable.   Stairs             Wheelchair Mobility    Modified Rankin (Stroke Patients Only)       Balance Overall balance assessment: Needs assistance Sitting-balance support: Feet supported Sitting balance-Leahy Scale: Good     Standing balance support: Bilateral upper extremity supported, During functional activity, Reliant on assistive device for balance Standing balance-Leahy Scale: Good                              Cognition Arousal/Alertness: Awake/alert Behavior During Therapy: WFL for tasks assessed/performed Overall Cognitive Status: Within Functional Limits for tasks assessed  Exercises Other Exercises Other Exercises: LAQ in sitting x 5 reps    General Comments        Pertinent Vitals/Pain Pain Assessment Pain Assessment: 0-10 Faces Pain Scale: Hurts worst Pain Location: back Pain Descriptors / Indicators: Discomfort, Grimacing, Guarding, Moaning Pain Intervention(s): Monitored during session, Premedicated before session, Repositioned    Home Living                          Prior Function            PT Goals (current goals can now be found in  the care plan section) Acute Rehab PT Goals Patient Stated Goal: to return home PT Goal Formulation: With patient Time For Goal Achievement: 08/20/22 Potential to Achieve Goals: Fair Progress towards PT goals: Progressing toward goals    Frequency    7X/week      PT Plan Current plan remains appropriate    Co-evaluation PT/OT/SLP Co-Evaluation/Treatment: Yes Reason for Co-Treatment: For patient/therapist safety;To address functional/ADL transfers PT goals addressed during session: Mobility/safety with mobility;Balance;Proper use of DME        AM-PAC PT "6 Clicks" Mobility   Outcome Measure  Help needed turning from your back to your side while in a flat bed without using bedrails?: A Lot Help needed moving from lying on your back to sitting on the side of a flat bed without using bedrails?: A Lot Help needed moving to and from a bed to a chair (including a wheelchair)?: Total Help needed standing up from a chair using your arms (e.g., wheelchair or bedside chair)?: A Lot Help needed to walk in hospital room?: Total Help needed climbing 3-5 steps with a railing? : Total 6 Click Score: 9    End of Session Equipment Utilized During Treatment: Back brace Activity Tolerance: Patient limited by pain Patient left: in bed;with call bell/phone within reach Nurse Communication: Mobility status PT Visit Diagnosis: Other abnormalities of gait and mobility (R26.89);History of falling (Z91.81);Difficulty in walking, not elsewhere classified (R26.2);Pain Pain - Right/Left: Right Pain - part of body: Knee (back)     Time: [id number] PT Time Calculation (min) (ACUTE ONLY): 31 min  Charges:  $Therapeutic Activity: 8-22 mins                     Ragan Duhon, PT, GCS 08/13/22,11:58 AM

## 2022-08-13 NOTE — Progress Notes (Signed)
Occupational Therapy Treatment Patient Details Name: Brittany Cobb MRN: [id number] DOB: July 31, 1970 Today's Date: 08/13/2022   History of present illness Brittany. Brittany Cobb is a 52 year old female with history of depression, anxiety, hypertension, GERD, morbid obesity, history of urinary retention, with no history of asthma, who presents emergency department for chief concerns of new acute low back pain. Patient slipped and fell on her bottom and has had difficulty ambulating since.  Found to have L1 compression fracture.   OT comments  Brittany Cobb was seen for OT/PT co-treatment on this date. Upon arrival to room pt reclined in bed, agreeable to tx. Pt requires SBA bed mobility. MIN A + RW sit<>stand from elevated bed height, improves to CGA + RW standing from chair. MIN A bed<>chair SPT, intermittent stabilizing assist and cues for sequencing. Pt making good progress toward goals, will continue to follow POC. Discharge recommendation remains appropriate.     Recommendations for follow up therapy are one component of a multi-disciplinary discharge planning process, led by the attending physician.  Recommendations may be updated based on patient status, additional functional criteria and insurance authorization.    Follow Up Recommendations  Skilled nursing-short term rehab (<3 hours/day)     Assistance Recommended at Discharge Intermittent Supervision/Assistance  Patient can return home with the following  A lot of help with walking and/or transfers;A lot of help with bathing/dressing/bathroom;Help with stairs or ramp for entrance   Equipment Recommendations  BSC/3in1    Recommendations for Other Services      Precautions / Restrictions Precautions Precautions: Fall;Back Precaution Booklet Issued: No Required Braces or Orthoses: Spinal Brace Spinal Brace: Lumbar corset;Applied in sitting position Restrictions Weight Bearing Restrictions: No       Mobility Bed Mobility Overal bed  mobility: Needs Assistance Bed Mobility: Supine to Sit, Sit to Supine Rolling: Min guard Sidelying to sit: Min guard   Sit to supine: Min assist        Transfers Overall transfer level: Needs assistance Equipment used: Rolling walker (2 wheels) Transfers: Sit to/from Stand Sit to Stand: From elevated surface, Min assist, +2 safety/equipment                 Balance Overall balance assessment: Needs assistance Sitting-balance support: Feet supported Sitting balance-Leahy Scale: Good     Standing balance support: Bilateral upper extremity supported, During functional activity, Reliant on assistive device for balance Standing balance-Leahy Scale: Good                             ADL either performed or assessed with clinical judgement   ADL Overall ADL's : Needs assistance/impaired                                       General ADL Comments: MOD A don LSO seated. MIN A + RW simulated BSC t/f      Cognition Arousal/Alertness: Awake/alert Behavior During Therapy: WFL for tasks assessed/performed Overall Cognitive Status: Within Functional Limits for tasks assessed                                                     Pertinent Vitals/ Pain       Pain Assessment Pain Assessment:  0-10 Pain Score: 10-Worst pain ever Pain Location: back Pain Descriptors / Indicators: Discomfort, Grimacing, Guarding, Moaning Pain Intervention(s): Limited activity within patient's tolerance, Premedicated before session   Frequency  Min 2X/week        Progress Toward Goals  OT Goals(current goals can now be found in the care plan section)  Progress towards OT goals: Progressing toward goals  Acute Rehab OT Goals Patient Stated Goal: improve pain OT Goal Formulation: With patient Time For Goal Achievement: 08/26/22 Potential to Achieve Goals: Good ADL Goals Pt Will Perform Grooming: with modified independence;standing Pt Will  Perform Lower Body Dressing: with modified independence;sitting/lateral leans;with adaptive equipment Pt Will Transfer to Toilet: with modified independence;ambulating;regular height toilet  Plan Discharge plan remains appropriate;Frequency remains appropriate    Co-evaluation    PT/OT/SLP Co-Evaluation/Treatment: Yes Reason for Co-Treatment: For patient/therapist safety;To address functional/ADL transfers PT goals addressed during session: Mobility/safety with mobility;Balance;Proper use of DME OT goals addressed during session: ADL's and self-care      AM-PAC OT "6 Clicks" Daily Activity     Outcome Measure   Help from another person eating meals?: None Help from another person taking care of personal grooming?: A Little Help from another person toileting, which includes using toliet, bedpan, or urinal?: A Lot Help from another person bathing (including washing, rinsing, drying)?: A Lot Help from another person to put on and taking off regular upper body clothing?: A Little Help from another person to put on and taking off regular lower body clothing?: A Lot 6 Click Score: 16    End of Session Equipment Utilized During Treatment: Rolling walker (2 wheels)  OT Visit Diagnosis: Other abnormalities of gait and mobility (R26.89);Muscle weakness (generalized) (M62.81)   Activity Tolerance Patient tolerated treatment well   Patient Left in bed;with call bell/phone within reach   Nurse Communication Mobility status        Time: [id number] OT Time Calculation (min): 24 min  Charges: OT General Charges $OT Visit: 1 Visit OT Treatments $Self Care/Home Management : 8-22 mins  [name], M.S. OTR/L  08/13/22, 3:00 PM  ascom [phone]

## 2022-08-13 NOTE — Progress Notes (Signed)
Physical Therapy Treatment Patient Details Name: Brittany Cobb MRN: [id number] DOB: 11-Feb-1971 Today's Date: 08/13/2022   History of Present Illness Brittany Cobb is a 52 year old female with history of depression, anxiety, hypertension, GERD, morbid obesity, history of urinary retention, with no history of asthma, who presents emergency department for chief concerns of new acute low back pain. Patient slipped and fell on her bottom and has had difficulty ambulating since.  Found to have L1 compression fracture.    PT Comments    Patient received in bed, friend present for session to observe patient's mobility and see if she will be able to help her at home. Patient requires min A for bed mobility, transfers min A and is able to pivot to recliner with min guard and RW. She is unable to shift her weight to her R LE, and unable to take steps at this point. Continue to work on functional mobility for improved independence and safety.      Recommendations for follow up therapy are one component of a multi-disciplinary discharge planning process, led by the attending physician.  Recommendations may be updated based on patient status, additional functional criteria and insurance authorization.  Follow Up Recommendations  Skilled nursing-short term rehab (<3 hours/day) Can patient physically be transported by private vehicle: No   Assistance Recommended at Discharge Intermittent Supervision/Assistance  Patient can return home with the following A lot of help with walking and/or transfers;A lot of help with bathing/dressing/bathroom;Help with stairs or ramp for entrance;Assist for transportation;Assistance with cooking/housework   Equipment Recommendations  Rolling walker (2 wheels);BSC/3in1    Recommendations for Other Services       Precautions / Restrictions Precautions Precautions: Knee;Back;Fall Precaution Booklet Issued: No Required Braces or Orthoses: Spinal Brace Spinal Brace: Lumbar  corset;Applied in sitting position Restrictions Weight Bearing Restrictions: No     Mobility  Bed Mobility Overal bed mobility: Needs Assistance Bed Mobility: Supine to Sit, Sit to Supine Rolling: Min guard Sidelying to sit: Min assist Supine to sit: Min assist Sit to supine: Min assist   General bed mobility comments: patient requires min A to get legs on and off bed. Heavy use of rails to roll and push up to sitting    Transfers Overall transfer level: Needs assistance Equipment used: Rolling walker (2 wheels) Transfers: Sit to/from Stand, Bed to chair/wheelchair/BSC Sit to Stand: From elevated surface, Min assist Stand pivot transfers: Min guard         General transfer comment: increased effort to stand. Cues needed to scoot to edge of bed, push from bed. Requires bed to be elevated to stand. Once standing she was able to pivot to recliner and sit briefly and then pivot back to bed. Increased time and effort needed, but not a lot of physical assist.    Ambulation/Gait               General Gait Details: unable to take actual steps   Stairs             Wheelchair Mobility    Modified Rankin (Stroke Patients Only)       Balance Overall balance assessment: Needs assistance Sitting-balance support: Feet supported Sitting balance-Leahy Scale: Good     Standing balance support: Bilateral upper extremity supported, During functional activity, Reliant on assistive device for balance Standing balance-Leahy Scale: Good  Cognition Arousal/Alertness: Awake/alert Behavior During Therapy: WFL for tasks assessed/performed Overall Cognitive Status: Within Functional Limits for tasks assessed                                          Exercises Other Exercises Other Exercises: LAQ in sitting x 5 reps Other Exercises: Encouraged patient to move legs in bed and get up as able with staff    General  Comments        Pertinent Vitals/Pain Pain Assessment Pain Assessment: 0-10 Pain Score: 10-Worst pain ever Faces Pain Scale: Hurts worst Pain Location: back and B knees due to OA she reports Pain Descriptors / Indicators: Discomfort, Grimacing, Guarding, Moaning Pain Intervention(s): Monitored during session, Premedicated before session, Repositioned    Home Living                          Prior Function            PT Goals (current goals can now be found in the care plan section) Acute Rehab PT Goals Patient Stated Goal: to return home PT Goal Formulation: With patient Time For Goal Achievement: 08/20/22 Potential to Achieve Goals: Fair Progress towards PT goals: Progressing toward goals    Frequency    7X/week      PT Plan Current plan remains appropriate    Co-evaluation PT/OT/SLP Co-Evaluation/Treatment: Yes Reason for Co-Treatment: To address functional/ADL transfers;For patient/therapist safety PT goals addressed during session: Mobility/safety with mobility;Balance;Proper use of DME OT goals addressed during session: ADL's and self-care      AM-PAC PT "6 Clicks" Mobility   Outcome Measure  Help needed turning from your back to your side while in a flat bed without using bedrails?: A Lot Help needed moving from lying on your back to sitting on the side of a flat bed without using bedrails?: A Lot Help needed moving to and from a bed to a chair (including a wheelchair)?: Total Help needed standing up from a chair using your arms (e.g., wheelchair or bedside chair)?: A Lot Help needed to walk in hospital room?: Total Help needed climbing 3-5 steps with a railing? : Total 6 Click Score: 9    End of Session Equipment Utilized During Treatment: Gait belt;Back brace Activity Tolerance: Patient limited by pain Patient left: in bed;with call bell/phone within reach;with bed alarm set;with family/visitor present Nurse Communication: Mobility  status PT Visit Diagnosis: Other abnormalities of gait and mobility (R26.89);History of falling (Z91.81);Difficulty in walking, not elsewhere classified (R26.2);Pain Pain - Right/Left: Right Pain - part of body: Knee (back)     Time: [id number] PT Time Calculation (min) (ACUTE ONLY): 24 min  Charges:  $Therapeutic Activity: 8-22 mins                     Sade Hollon, PT, GCS 08/13/22,3:39 PM

## 2022-08-13 NOTE — Plan of Care (Signed)
  Problem: Health Behavior/Discharge Planning: Goal: Ability to manage health-related needs will improve Outcome: Progressing   

## 2022-08-14 ENCOUNTER — Inpatient Hospital Stay: Payer: Medicaid Other

## 2022-08-14 DIAGNOSIS — R52 Pain, unspecified: Secondary | ICD-10-CM

## 2022-08-14 DIAGNOSIS — S32010A Wedge compression fracture of first lumbar vertebra, initial encounter for closed fracture: Secondary | ICD-10-CM | POA: Diagnosis not present

## 2022-08-14 DIAGNOSIS — W19XXXA Unspecified fall, initial encounter: Secondary | ICD-10-CM | POA: Diagnosis not present

## 2022-08-14 MED ORDER — TIZANIDINE HCL 4 MG PO TABS
4.0000 mg | ORAL_TABLET | Freq: Three times a day (TID) | ORAL | Status: DC | PRN
  Administered 2022-08-15 – 2022-08-26 (×16): 4 mg via ORAL
  Filled 2022-08-14 (×16): qty 1

## 2022-08-14 MED ORDER — OXYCODONE HCL 5 MG PO TABS
10.0000 mg | ORAL_TABLET | ORAL | Status: DC | PRN
  Administered 2022-08-14 (×2): 10 mg via ORAL
  Administered 2022-08-15: 5 mg via ORAL
  Administered 2022-08-15 – 2022-08-26 (×40): 10 mg via ORAL
  Filled 2022-08-14 (×47): qty 2

## 2022-08-14 MED ORDER — IBUPROFEN 400 MG PO TABS: 400.0000 mg | ORAL_TABLET | ORAL | Status: DC | PRN

## 2022-08-14 MED ORDER — IBUPROFEN 400 MG PO TABS
400.0000 mg | ORAL_TABLET | ORAL | Status: DC
  Administered 2022-08-14 – 2022-08-15 (×6): 400 mg via ORAL
  Filled 2022-08-14 (×6): qty 1

## 2022-08-14 MED ORDER — OXYCODONE HCL 5 MG PO TABS
5.0000 mg | ORAL_TABLET | ORAL | Status: DC | PRN
  Filled 2022-08-14: qty 1

## 2022-08-14 MED ORDER — GABAPENTIN 300 MG PO CAPS
300.0000 mg | ORAL_CAPSULE | ORAL | Status: AC
  Administered 2022-08-15: 300 mg via ORAL
  Filled 2022-08-14: qty 1

## 2022-08-14 MED ORDER — ACETAMINOPHEN 325 MG PO TABS
650.0000 mg | ORAL_TABLET | Freq: Four times a day (QID) | ORAL | Status: DC
  Administered 2022-08-14 – 2022-08-25 (×44): 650 mg via ORAL
  Filled 2022-08-14 (×49): qty 2

## 2022-08-14 MED ORDER — GABAPENTIN 300 MG PO CAPS
300.0000 mg | ORAL_CAPSULE | Freq: Three times a day (TID) | ORAL | Status: DC
  Administered 2022-08-15 – 2022-08-26 (×34): 300 mg via ORAL
  Filled 2022-08-14 (×34): qty 1

## 2022-08-14 NOTE — Progress Notes (Signed)
PROGRESS NOTE    Brittany Cobb  [id number] DOB: 22-Oct-1970 DOA: 08/11/2022 PCP: Marzetta Board, DO   Brief Narrative: 52 year old with past medical history significant for depression, anxiety, hypertension, GERD, morbid obesity, history of urinary retention, who presents to the ED complaining of acute low back pain.  She slipped and fell onto her back  and had difficulty ambulating since.    She was found to have Acute appearing superior endplate compression deformity at L1 with approximately 40% height loss. No evidence of retropulsion. No evidence of an epidural hematoma.  Moderate spinal canal stenosis at L4-L5 secondary to combination of a small disc bulge and ligamentum flavum hypertrophy. Moderate right-sided neural foraminal stenosis at L4-L5.   Assessment & Plan:   Principal Problem:   Inadequate pain control Active Problems:   Obesity, Class III, BMI 40-49.9 (morbid obesity) (HCC)   Essential hypertension   GERD (gastroesophageal reflux disease)   Anxiety   Grieving   Compression fracture of L1 lumbar vertebra, closed, initial encounter ([facility])   1-Back pain: L1 Compression Fracture:  CT lumbar spine: Acute appearing superior endplate compression deformity at L1 with approximately 40% height loss. No evidence of retropulsion. No evidence of an epidural hematoma.  Moderate spinal canal stenosis at L4-L5 secondary to combination of a small disc bulge and ligamentum flavum hypertrophy. Moderate right-sided neural foraminal stenosis at L4-L5. -neurosurgery recommend pain management and LSO brace.  -Pain not controlled, continue with morphine -lidocaine Pacth.  -received  Toradol IV for two days.  -Continue with oxycodone.  -PT /OT consulted, recommend SNF< patient unable to afford SNF> and would like to go home.  -still having pain, unable to put weight right leg due to pain.  -I have ask Neurosurgery to follow on patient today.  -PRN Ibuprofen.   2-Anxiety: Continue with  trazodone, Venlafaxine  and lorazepam as needed  3-GERD: Continue with PPI. 4-Essential hypertension: Continue with amlodipine and furosemide.  As needed hydralazine 5-Obesity class III BMI 49: Need lifestyle modification Constipation: Continue  Miralax, senna.     Grieving - Her mother passed in November 2023 from fistula, sepsis    Estimated body mass index is 49.42 kg/m as calculated from the following:   Height as of this encounter: '5\' 8"'$  (1.727 m).   Weight as of this encounter: 147.4 kg.   DVT prophylaxis: Lovenox Code Status: Full code Family Communication: Care discussed with patient.  Disposition Plan:  Status is: Inpatient Remains inpatient appropriate because: Management of back pain     Consultants:  Neurosurgery   Procedures:  none  Antimicrobials:    Subjective: Pain is not better, not controlled.  Unable to put weight right leg due to pain.     Objective: Vitals:   08/13/22 0810 08/13/22 1554 08/13/22 2359 08/14/22 0911  BP: 120/62 (!) 119/56 (!) 124/55 134/67  Pulse: 95 85 81 65  Resp: '20 18 20 17  '$ Temp: 98.7 F (37.1 C) 98.6 F (37 C) 98.6 F (37 C) 98.6 F (37 C)  TempSrc: Oral     SpO2: 93% 96% 92% 95%  Weight:      Height:        Intake/Output Summary (Last 24 hours) at 08/14/2022 1329 Last data filed at 08/14/2022 1000 Gross per 24 hour  Intake 440 ml  Output 1100 ml  Net -660 ml    Filed Weights   08/11/22 1218  Weight: (!) 147.4 kg    Examination:  General exam: NAD Respiratory system: CTA Cardiovascular system:  S 1, S 2 RRR Gastrointestinal system: BS present, soft, nt Central nervous system: Alert, follows command  Extremities: no edema  Data Reviewed: I have personally reviewed following labs and imaging studies  CBC: Recent Labs  Lab 08/11/22 1455 08/12/22 0416  WBC 7.9 7.1  NEUTROABS 5.0  --   HGB 13.5 12.9  HCT 41.1 39.7  MCV 88.6 89.4  PLT 243 [id number]    Basic Metabolic Panel: Recent Labs  Lab  08/11/22 1455 08/12/22 0416  NA 141 139  K 3.6 3.5  CL 106 103  CO2 27 28  GLUCOSE 97 131*  BUN 11 14  CREATININE 0.65 0.82  CALCIUM 8.9 8.8*    GFR: Estimated Creatinine Clearance: 123.3 mL/min (by C-G formula based on SCr of 0.82 mg/dL). Liver Function Tests: No results for input(s): "AST", "ALT", "ALKPHOS", "BILITOT", "PROT", "ALBUMIN" in the last 168 hours. No results for input(s): "LIPASE", "AMYLASE" in the last 168 hours. No results for input(s): "AMMONIA" in the last 168 hours. Coagulation Profile: No results for input(s): "INR", "PROTIME" in the last 168 hours. Cardiac Enzymes: No results for input(s): "CKTOTAL", "CKMB", "CKMBINDEX", "TROPONINI" in the last 168 hours. BNP (last 3 results) No results for input(s): "PROBNP" in the last 8760 hours. HbA1C: No results for input(s): "HGBA1C" in the last 72 hours. CBG: No results for input(s): "GLUCAP" in the last 168 hours. Lipid Profile: No results for input(s): "CHOL", "HDL", "LDLCALC", "TRIG", "CHOLHDL", "LDLDIRECT" in the last 72 hours. Thyroid Function Tests: No results for input(s): "TSH", "T4TOTAL", "FREET4", "T3FREE", "THYROIDAB" in the last 72 hours. Anemia Panel: No results for input(s): "VITAMINB12", "FOLATE", "FERRITIN", "TIBC", "IRON", "RETICCTPCT" in the last 72 hours. Sepsis Labs: No results for input(s): "PROCALCITON", "LATICACIDVEN" in the last 168 hours.  No results found for this or any previous visit (from the past 240 hour(s)).       Radiology Studies: No results found.      Scheduled Meds:  amLODipine  5 mg Oral Daily   enoxaparin (LOVENOX) injection  0.5 mg/kg Subcutaneous Q24H   furosemide  20 mg Oral BID   lamoTRIgine  200 mg Oral QHS   lidocaine  2 patch Transdermal Q24H   mometasone-formoterol  2 puff Inhalation BID   pantoprazole  40 mg Oral BID   polyethylene glycol  17 g Oral BID   senna-docusate  1 tablet Oral BID   topiramate  50 mg Oral QHS   traZODone  50 mg Oral QHS    valACYclovir  1,000 mg Oral Daily   venlafaxine XR  150 mg Oral Daily   Continuous Infusions:   LOS: 2 days    Time spent: 35 minutes    Geran Haithcock A Delmont Prosch, MD [facility]   If 7PM-7AM, please contact night-coverage [email or web address]  08/14/2022, 1:29 PM

## 2022-08-14 NOTE — Progress Notes (Signed)
    Attending Progress Note  History: Brittany Cobb is s/p mechanical fall resulting in L1 compression fracture  HD4: She continues to have low back pain which is not well controlled by her current medication regimen.   Physical Exam: Vitals:   08/13/22 2359 08/14/22 0911  BP: (!) 124/55 134/67  Pulse: 81 65  Resp: 20 17  Temp: 98.6 F (37 C) 98.6 F (37 C)  SpO2: 92% 95%    AA Ox3 CNI  Strength:5/5 throughout BLE   Data:  Other tests/results:  CT L spine 08/11/2022 IMPRESSION: 1. Acute appearing superior endplate compression deformity at L1 with approximately 40% height loss. No evidence of retropulsion. No evidence of an epidural hematoma. 2. Moderate spinal canal stenosis at L4-L5 secondary to combination of a small disc bulge and ligamentum flavum hypertrophy. Moderate right-sided neural foraminal stenosis at L4-L5.     Electronically Signed   By: Marin Roberts M.D.   On: 08/11/2022 14:00    Assessment/Plan:  Brittany Cobb is a 52 year old presenting after mechanical fall resulting in L1 compression fracture with persistent back pain  - mobilize - pain control; scheduled Tylenol and Advil.  Change Percocet to oxycodone 5 to 10 mg every 4 hours as needed and discontinued IV narcotics. -Discussed the possibility of kyphoplasty with the patient in detail including risk and benefits and she declined to consider this procedure. -Could consider upright x-rays in brace if patient continues to have uncontrolled pain with medication changes. -Will follow-up outpatient with repeat x-rays. -No plan for acute neurosurgical intervention at this time.  Cooper Render PA-C Department of Neurosurgery

## 2022-08-14 NOTE — Progress Notes (Signed)
Occupational Therapy Treatment Patient Details Name: Brittany Cobb MRN: [id number] DOB: 1971/02/08 Today's Date: 08/14/2022   History of present illness Brittany Cobb is a 52 year old female with history of depression, anxiety, hypertension, GERD, morbid obesity, history of urinary retention, with no history of asthma, who presents emergency department for chief concerns of new acute low back pain. Patient slipped and fell on her bottom and has had difficulty ambulating since.  Found to have L1 compression fracture.   OT comments  [name] was seen for OT treatment on this date. Upon arrival to room pt reclined in bed, agreeable to tx. Pt completes bed mobility MOD I, use of bed rail. Dons LSO in sitting MOD I. Pt verbalized plan to stand for grooming however MD in to discuss d/c plan, pt became emotional and requested to terminate therapy session. Pt making good progress toward goals, will continue to follow POC. Discharge recommendation remains appropriate.     Recommendations for follow up therapy are one component of a multi-disciplinary discharge planning process, led by the attending physician.  Recommendations may be updated based on patient status, additional functional criteria and insurance authorization.    Follow Up Recommendations  Skilled nursing-short term rehab (<3 hours/day)     Assistance Recommended at Discharge Intermittent Supervision/Assistance  Patient can return home with the following  A lot of help with walking and/or transfers;A lot of help with bathing/dressing/bathroom;Help with stairs or ramp for entrance   Equipment Recommendations  BSC/3in1    Recommendations for Other Services      Precautions / Restrictions Precautions Precautions: Knee;Back;Fall Precaution Booklet Issued: No Required Braces or Orthoses: Spinal Brace Spinal Brace: Lumbar corset Restrictions Weight Bearing Restrictions: No       Mobility Bed Mobility Overal bed mobility:  Modified Independent             General bed mobility comments: heavy use of bed rails    Transfers                   General transfer comment: pt deferred after conversation with MD, pt became upset and chose to terminate session     Balance Overall balance assessment: Needs assistance Sitting-balance support: Feet supported Sitting balance-Leahy Scale: Normal                                     ADL either performed or assessed with clinical judgement   ADL Overall ADL's : Needs assistance/impaired                                       General ADL Comments: MOD I don TLSO      Cognition Arousal/Alertness: Awake/alert Behavior During Therapy: WFL for tasks assessed/performed Overall Cognitive Status: Within Functional Limits for tasks assessed                                                     Pertinent Vitals/ Pain       Pain Assessment Pain Assessment: Faces Faces Pain Scale: Hurts little more Pain Location: R knee Pain Descriptors / Indicators: Aching, Grimacing, Guarding Pain Intervention(s): Limited activity within patient's tolerance, Repositioned, Premedicated before session  Frequency  Min 2X/week        Progress Toward Goals  OT Goals(current goals can now be found in the care plan section)  Progress towards OT goals: Progressing toward goals  Acute Rehab OT Goals Patient Stated Goal: to go home OT Goal Formulation: With patient Time For Goal Achievement: 08/26/22 Potential to Achieve Goals: Good ADL Goals Pt Will Perform Grooming: with modified independence;standing Pt Will Perform Lower Body Dressing: with modified independence;sitting/lateral leans;with adaptive equipment Pt Will Transfer to Toilet: with modified independence;ambulating;regular height toilet  Plan Discharge plan remains appropriate;Frequency remains appropriate    Co-evaluation                  AM-PAC OT "6 Clicks" Daily Activity     Outcome Measure   Help from another person eating meals?: None Help from another person taking care of personal grooming?: A Little Help from another person toileting, which includes using toliet, bedpan, or urinal?: A Lot Help from another person bathing (including washing, rinsing, drying)?: A Lot Help from another person to put on and taking off regular upper body clothing?: None Help from another person to put on and taking off regular lower body clothing?: A Lot 6 Click Score: 17    End of Session    OT Visit Diagnosis: Other abnormalities of gait and mobility (R26.89);Muscle weakness (generalized) (M62.81)   Activity Tolerance Patient tolerated treatment well   Patient Left in bed;with call bell/phone within reach   Nurse Communication          Time: [id number] OT Time Calculation (min): 24 min  Charges: OT General Charges $OT Visit: 1 Visit OT Treatments $Self Care/Home Management : 8-22 mins $Therapeutic Activity: 8-22 mins  [name], M.S. OTR/L  08/14/22, 12:32 PM  ascom [phone]

## 2022-08-14 NOTE — TOC Progression Note (Signed)
Transition of Care [facility]) - Progression Note    Patient Details  Name: Brittany Cobb MRN: [id number] Date of Birth: 25-Jul-1970  Transition of Care [facility]) CM/SW [place], RN Phone Number: 08/14/2022, 3:45 PM  Clinical Narrative:    Met with the patient and her friend Stanton Kidney in the room at her request and they had Jimmie on the phone on Speaker She asked about a transfer to another hospital, I explained that in order for Insurance to cover a transfer it has to be for a reason that there is a medical reason that can not be managed at this Hospital I explained that the other way she could go to another hospital would be to discharge and go to the one of her choice ER She stated that she has had great care up until today, she is not happy about not getting pain meds as needed., I asked her if she has requested pain meds, she stated that she had other nurses ask her if she wanted it, I let her know that if the medication is ordered PRN she may want to ask for it when she is in pain.   I reviewed again to go to [facility] does not cover it at 100% and she would need to have payment of some kind to make up the difference, and due to her not having an income she would have to pay up front to the facility, if she had an income they would need her to sign over a monthly check.  She stated that the Doctor stated that [facility] may or may not help her and she was not interested in doing it if she can not be guaranteed it would be effective, I explained that no procedure can be guaranteed 100% to be effective, I explained if she is not able to go to STR then I could ask Inpatient rehab to review and see if she is a candidate, I explained that she would need to be able to withstand and participate in therapy for 3 hours a day She stated that she would not be able to tolerate 3 hours a day because her pain level would not make it possible to do any therapy because it hurts too much to even walk, I  explained to her that unfortunately I do not have a lot of resources for her situation, She is not able to go to STR, due to cost, I explained that if she could afford it then they would require her to participate with therapy as well and if it was too painful  for any reason she was not able to participate then they would send her home She stated that she should have went somewhere else, I explained that the issues we are talking about are not hospital specific but resource specific and that would not change regardless of what Hospital she was in, she stated agreement.   She stated that she was not going to have the Kyphoplasty done and she can not walk due to pain, I asked her what her plan was and she said that she just wanted her pain controlled.  I explained that no treatment for her pain could be 100% guaranteed to work, she stated that she did not feel my services were needed since I could not help with these things.  I let her know if there was anything else I could help with to please let me know  Expected Discharge Plan: OP Rehab Barriers  to Discharge: Inadequate or no insurance  Expected Discharge Plan and Services   Discharge Planning Services: CM Consult Post Acute Care Choice: [place] arrangements for the past 2 months: Mobile Home                 DME Arranged: Walker rolling, 3-N-1 DME Agency: [facility] [employer] Date DME Agency Contacted: 08/13/22 Time DME Agency Contacted: [phone] Representative spoke with at DME Agency: [place]: NA [place] Agency: NA Date [place]: 08/13/22 Time [place]: [id number] Representative spoke with at [place]: [place] Determinants of Health ([place]) Interventions SDOH Screenings   Food Insecurity: No Food Insecurity (08/12/2022)  Housing: Low Risk  (08/12/2022)  Transportation Needs: No Transportation Needs (08/13/2022)  Utilities: Not At Risk (08/12/2022)  Tobacco Use: High Risk (08/11/2022)     Readmission Risk Interventions     No data to display

## 2022-08-14 NOTE — Progress Notes (Signed)
Physical Therapy Treatment Patient Details Name: Brittany Cobb MRN: [id number] DOB: 07/05/1970 Today's Date: 08/14/2022   History of Present Illness [name] is a 52 year old female with history of depression, anxiety, hypertension, GERD, morbid obesity, history of urinary retention, with no history of asthma, who presents emergency department for chief concerns of new acute low back pain. Patient slipped and fell on her bottom and has had difficulty ambulating since.  Found to have L1 compression fracture.    PT Comments    Patient received in supine on phone with friend. She is agreeable to PT session. Patient is able to roll and perform all bed mobility with mod I. Heavy use of rails and increased time. Educated patient on donning and doffing [place]. She is able to stand from raised bed with min guard A. Patient is able to pivot sideways and pivot around to recliner and back to bed. Improved mobility this date. She will continue to benefit from skilled PT to improve functional independence for safe return home.       Recommendations for follow up therapy are one component of a multi-disciplinary discharge planning process, led by the attending physician.  Recommendations may be updated based on patient status, additional functional criteria and insurance authorization.  Follow Up Recommendations  Skilled nursing-short term rehab (<3 hours/day) Can patient physically be transported by private vehicle: No   Assistance Recommended at Discharge Intermittent Supervision/Assistance  Patient can return home with the following A lot of help with walking and/or transfers;A lot of help with bathing/dressing/bathroom;Help with stairs or ramp for entrance;Assist for transportation;Assistance with cooking/housework   Equipment Recommendations  Rolling walker (2 wheels);BSC/3in1    Recommendations for Other Services       Precautions / Restrictions Precautions Precautions:  Knee;Back;Fall Precaution Booklet Issued: No Required Braces or Orthoses: Spinal Brace Spinal Brace: Lumbar corset Restrictions Weight Bearing Restrictions: No     Mobility  Bed Mobility Overal bed mobility: Modified Independent Bed Mobility: Supine to Sit, Sit to Supine, Rolling, Sidelying to Sit, Sit to Sidelying Rolling: Modified independent (Device/Increase time) Sidelying to sit: Modified independent (Device/Increase time) Supine to sit: Modified independent (Device/Increase time) Sit to supine: Modified independent (Device/Increase time) Sit to sidelying: Modified independent (Device/Increase time) General bed mobility comments: heavy use of bed rails and increased effort, but no physical assist provided    Transfers Overall transfer level: Needs assistance Equipment used: Rolling walker (2 wheels) Transfers: Sit to/from Stand, Bed to chair/wheelchair/BSC Sit to Stand: Min guard Stand pivot transfers: Supervision         General transfer comment: increased effort to stand. Cues needed to scoot to edge of bed, push from bed. Requires bed to be elevated to stand. Once standing she was able to pivot to recliner and then pivot back to bed. Increased time and effort needed, but no physical assist. Patient also able to take a few side steps ( pivoting)    Ambulation/Gait               General Gait Details: unable to take actual steps due to pain in B knees. Unable to weight shift onto right LE enough to step with left foot   Stairs             Wheelchair Mobility    Modified Rankin (Stroke Patients Only)       Balance Overall balance assessment: Needs assistance Sitting-balance support: Feet supported Sitting balance-Leahy Scale: Normal     Standing balance support: Bilateral upper extremity  supported, During functional activity, Reliant on assistive device for balance Standing balance-Leahy Scale: Good Standing balance comment: Assist for safety                             Cognition Arousal/Alertness: Awake/alert Behavior During Therapy: WFL for tasks assessed/performed Overall Cognitive Status: Within Functional Limits for tasks assessed                                          Exercises      General Comments        Pertinent Vitals/Pain Pain Assessment Pain Assessment: Faces Faces Pain Scale: Hurts worst Pain Location: back and B knees due to OA she reports Pain Descriptors / Indicators: Aching, Discomfort, Grimacing, Guarding Pain Intervention(s): Monitored during session, Repositioned    Home Living                          Prior Function            PT Goals (current goals can now be found in the care plan section) Acute Rehab PT Goals Patient Stated Goal: to return home PT Goal Formulation: With patient Time For Goal Achievement: 08/20/22 Potential to Achieve Goals: Good Progress towards PT goals: Progressing toward goals    Frequency    7X/week      PT Plan Current plan remains appropriate    Co-evaluation              AM-PAC PT "6 Clicks" Mobility   Outcome Measure  Help needed turning from your back to your side while in a flat bed without using bedrails?: A Lot Help needed moving from lying on your back to sitting on the side of a flat bed without using bedrails?: A Lot Help needed moving to and from a bed to a chair (including a wheelchair)?: A Little Help needed standing up from a chair using your arms (e.g., wheelchair or bedside chair)?: A Little Help needed to walk in hospital room?: Total Help needed climbing 3-5 steps with a railing? : Total 6 Click Score: 12    End of Session Equipment Utilized During Treatment: Back brace Activity Tolerance: Patient limited by pain Patient left: in bed;with call bell/phone within reach Nurse Communication: Mobility status PT Visit Diagnosis: Other abnormalities of gait and mobility (R26.89);History  of falling (Z91.81);Difficulty in walking, not elsewhere classified (R26.2);Pain Pain - Right/Left: Right Pain - part of body: Knee (back)     Time: [id number] PT Time Calculation (min) (ACUTE ONLY): 31 min  Charges:  $Therapeutic Activity: 23-37 mins                     Sarahlynn Cisnero, PT, GCS 08/14/22,9:26 AM

## 2022-08-14 NOTE — Plan of Care (Signed)

## 2022-08-15 ENCOUNTER — Inpatient Hospital Stay: Payer: Medicaid Other

## 2022-08-15 DIAGNOSIS — W19XXXA Unspecified fall, initial encounter: Secondary | ICD-10-CM | POA: Diagnosis not present

## 2022-08-15 DIAGNOSIS — R52 Pain, unspecified: Secondary | ICD-10-CM | POA: Diagnosis not present

## 2022-08-15 DIAGNOSIS — S32010A Wedge compression fracture of first lumbar vertebra, initial encounter for closed fracture: Secondary | ICD-10-CM | POA: Diagnosis not present

## 2022-08-15 MED ORDER — IBUPROFEN 400 MG PO TABS
400.0000 mg | ORAL_TABLET | Freq: Three times a day (TID) | ORAL | Status: AC
  Administered 2022-08-15 – 2022-08-17 (×6): 400 mg via ORAL
  Filled 2022-08-15 (×6): qty 1

## 2022-08-15 MED ORDER — DICLOFENAC SODIUM 1 % EX GEL
2.0000 g | Freq: Four times a day (QID) | CUTANEOUS | Status: DC
  Administered 2022-08-15 – 2022-08-26 (×36): 2 g via TOPICAL
  Filled 2022-08-15: qty 100

## 2022-08-15 MED ORDER — BISACODYL 10 MG RE SUPP
10.0000 mg | Freq: Every day | RECTAL | Status: DC | PRN
  Administered 2022-08-15: 10 mg via RECTAL
  Filled 2022-08-15: qty 1

## 2022-08-15 NOTE — Plan of Care (Signed)
  Problem: Clinical Measurements: Goal: Respiratory complications will improve Outcome: Progressing   Problem: Activity: Goal: Risk for activity intolerance will decrease Outcome: Progressing   Problem: Pain Managment: Goal: General experience of comfort will improve Outcome: Progressing   Problem: Safety: Goal: Ability to remain free from injury will improve Outcome: Progressing

## 2022-08-15 NOTE — Progress Notes (Signed)
PROGRESS NOTE    Brittany Cobb  [id number] DOB: 1970-12-22 DOA: 08/11/2022 PCP: Marzetta Board, DO   Brief Narrative: 52 year old with past medical history significant for depression, anxiety, hypertension, GERD, morbid obesity, history of urinary retention, who presents to the ED complaining of acute low back pain.  She slipped and fell onto her back  and had difficulty ambulating since.    She was found to have Acute appearing superior endplate compression deformity at L1 with approximately 40% height loss. No evidence of retropulsion. No evidence of an epidural hematoma.  Moderate spinal canal stenosis at L4-L5 secondary to combination of a small disc bulge and ligamentum flavum hypertrophy. Moderate right-sided neural foraminal stenosis at L4-L5.   Assessment & Plan:   Principal Problem:   Inadequate pain control Active Problems:   Obesity, Class III, BMI 40-49.9 (morbid obesity) (HCC)   Essential hypertension   GERD (gastroesophageal reflux disease)   Anxiety   Grieving   Compression fracture of L1 lumbar vertebra, closed, initial encounter ([place])   1-Back pain: L1 Compression Fracture:  CT lumbar spine: Acute appearing superior endplate compression deformity at L1 with approximately 40% height loss. No evidence of retropulsion. No evidence of an epidural hematoma.  Moderate spinal canal stenosis at L4-L5 secondary to combination of a small disc bulge and ligamentum flavum hypertrophy. Moderate right-sided neural foraminal stenosis at L4-L5. -neurosurgery recommend pain management and LSO brace.  -Pain not controlled, continue with morphine -lidocaine Patch.  -Received  Toradol IV for two days.  -Continue with oxycodone. Neurosurgery increase dose 2/23. -PT /OT consulted, recommend SNF< patient unable to afford SNF> and would like to go home.  -still having pain, unable to put weight right leg due to pain.  -Repeated CT spine fracture stable.  Started on Gabapentin and  Zanaflex 2/23 by neurosurgery.  Decline Kyphoplasty.  Unable to ambulate due to pain  Will change Ibuprofen to Q 8 hours for 2 days. Will need to monitor renal function.   2-Anxiety: Continue with trazodone, Venlafaxine  and lorazepam as needed  3-GERD: Continue with PPI. 4-Essential hypertension: Continue with amlodipine and furosemide.  As needed hydralazine 5-Obesity class III BMI 49: Need lifestyle modification Constipation: Continue  Miralax, senna. Dulcolax supp  Bilateral Knee pain: right Knee CT negative for fracture, osteoarthritis.  Voltaren gel ordered.  Plan to get X ray left knee.   Grieving - Her mother passed in November 2023 from fistula, sepsis    Estimated body mass index is 49.42 kg/m as calculated from the following:   Height as of this encounter: '5\' 8"'$  (1.727 m).   Weight as of this encounter: 147.4 kg.   DVT prophylaxis: Lovenox Code Status: Full code Family Communication: Care discussed with patient.  Disposition Plan:  Status is: Inpatient Remains inpatient appropriate because: Management of back pain , needs safe discharge plan, lives alone, unable to ambulate due to pain     Consultants:  Neurosurgery   Procedures:  none  Antimicrobials:    Subjective: She is complaining of back pain. She can't walk due to pain.  She lives alone, she can't walk. She is still having pain.  Complaints of knees pain. She has osteoarthritis.   Objective: Vitals:   08/14/22 2208 08/15/22 0011 08/15/22 0033 08/15/22 0816  BP: 122/61 (!) 110/43 (!) 117/58 125/65  Pulse: 83 72  70  Resp:  16  17  Temp:  98.2 F (36.8 C)  98 F (36.7 C)  TempSrc:      SpO2:  94% 94%  97%  Weight:      Height:        Intake/Output Summary (Last 24 hours) at 08/15/2022 1623 Last data filed at 08/15/2022 [id number] Gross per 24 hour  Intake --  Output 1200 ml  Net -1200 ml    Filed Weights   08/11/22 1218  Weight: (!) 147.4 kg    Examination:  General exam:  NAD Respiratory system: CTA Cardiovascular system: S 1. S 2 RRR Gastrointestinal system: BS present, soft nt Central nervous system: alert, follows command Extremities: no edema  Data Reviewed: I have personally reviewed following labs and imaging studies  CBC: Recent Labs  Lab 08/11/22 1455 08/12/22 0416  WBC 7.9 7.1  NEUTROABS 5.0  --   HGB 13.5 12.9  HCT 41.1 39.7  MCV 88.6 89.4  PLT 243 [id number]    Basic Metabolic Panel: Recent Labs  Lab 08/11/22 1455 08/12/22 0416  NA 141 139  K 3.6 3.5  CL 106 103  CO2 27 28  GLUCOSE 97 131*  BUN 11 14  CREATININE 0.65 0.82  CALCIUM 8.9 8.8*    GFR: Estimated Creatinine Clearance: 123.3 mL/min (by C-G formula based on SCr of 0.82 mg/dL). Liver Function Tests: No results for input(s): "AST", "ALT", "ALKPHOS", "BILITOT", "PROT", "ALBUMIN" in the last 168 hours. No results for input(s): "LIPASE", "AMYLASE" in the last 168 hours. No results for input(s): "AMMONIA" in the last 168 hours. Coagulation Profile: No results for input(s): "INR", "PROTIME" in the last 168 hours. Cardiac Enzymes: No results for input(s): "CKTOTAL", "CKMB", "CKMBINDEX", "TROPONINI" in the last 168 hours. BNP (last 3 results) No results for input(s): "PROBNP" in the last 8760 hours. HbA1C: No results for input(s): "HGBA1C" in the last 72 hours. CBG: No results for input(s): "GLUCAP" in the last 168 hours. Lipid Profile: No results for input(s): "CHOL", "HDL", "LDLCALC", "TRIG", "CHOLHDL", "LDLDIRECT" in the last 72 hours. Thyroid Function Tests: No results for input(s): "TSH", "T4TOTAL", "FREET4", "T3FREE", "THYROIDAB" in the last 72 hours. Anemia Panel: No results for input(s): "VITAMINB12", "FOLATE", "FERRITIN", "TIBC", "IRON", "RETICCTPCT" in the last 72 hours. Sepsis Labs: No results for input(s): "PROCALCITON", "LATICACIDVEN" in the last 168 hours.  No results found for this or any previous visit (from the past 240 hour(s)).       Radiology  Studies: DG Knee 1-2 Views Left  Result Date: 08/15/2022 CLINICAL DATA:  Left knee pain after fall. EXAM: LEFT KNEE - 1-2 VIEW COMPARISON:  None Available. FINDINGS: Moderate to severe medial compartment joint space narrowing. Mild-to-moderate peripheral medial compartment degenerative osteophytes. Minimal peripheral lateral compartment degenerative osteophytosis. Mild superior greater than inferior patellar degenerative spurring. No joint effusion. No acute fracture or dislocation. 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 Electronically Signed   By: Yvonne Kendall M.D.   On: 08/15/2022 14:24   CT LUMBAR SPINE WO CONTRAST  Result Date: 08/15/2022 CLINICAL DATA:  L1 compression fracture follow-up. EXAM: CT LUMBAR SPINE WITHOUT CONTRAST TECHNIQUE: Multidetector CT imaging of the lumbar spine was performed without intravenous contrast administration. Multiplanar CT image reconstructions were also generated. RADIATION DOSE REDUCTION: This exam was performed according to the departmental dose-optimization program which includes automated exposure control, adjustment of the mA and/or kV according to patient size and/or use of iterative reconstruction technique. COMPARISON:  Lumbar spine CT 08/11/2022 FINDINGS: Segmentation: 5 lumbar type vertebrae. Alignment: Unchanged. There is slight dextroscoliosis and a minimal grade 1 anterolisthesis at L4-5 due to moderate to severe facet hypertrophy at that level. Vertebrae: Mild osteopenia.  Again noted is a recent L1 upper plate anterior wedge compression fracture deformity, with loss of anterior height of 30%, and no posterior height loss or retropulsion. There has been no significant interval change. No spinal canal hematoma is seen. Other lumbar vertebra are normal in heights. Paraspinal and other soft tissues: Mild aortic atherosclerosis. Nonobstructive left nephrolithiasis. No paraspinal hematoma. No fluid collection or masses. Disc levels:  There is preservation of the normal lumbar disc heights. There is no bulge, herniation or stenosis T11-12 through L2-3. Facet joint spurring is seen at L1-2 and L2-3 without foraminal encroachment. At L3-4, moderate facet spurring is seen and a mild circumferential disc bulge as well as dorsal epidural fat. There is mild-to-moderate secondary spinal canal stenosis and lateral recess effacement. There is moderate left-greater-than-right foraminal stenosis. At L4-5 with severe acquired spinal stenosis is seen due to disc bulge, advanced facet hypertrophy and dorsal epidural fat. Moderate to severe bilateral foraminal stenosis. At L5-S1 no disc bulge or canal stenosis are seen. Moderately advanced facet hypertrophy causes bilateral moderate to severe foraminal stenosis. Other: Both SI joints are patent with mild spurring. The visualized sacrum shows no insufficiency fracture. IMPRESSION: 1. Unchanged recent L1 upper plate anterior wedge compression fracture with 30% loss of anterior height and no posterior height loss or retropulsion. 2. Osteopenia and degenerative change. 3. L4-5 severe acquired spinal stenosis with moderate to severe bilateral foraminal stenosis. 4. L3-4 mild-to-moderate spinal canal stenosis with moderate left-greater-than-right foraminal stenosis. 5. L5-S1 moderate to severe bilateral foraminal stenosis. 6. Nonobstructive left nephrolithiasis. 7. Aortic atherosclerosis. Aortic Atherosclerosis (ICD10-I70.0). Electronically Signed   By: Telford Nab M.D.   On: 08/15/2022 02:34        Scheduled Meds:  acetaminophen  650 mg Oral Q6H   amLODipine  5 mg Oral Daily   diclofenac Sodium  2 g Topical QID   enoxaparin (LOVENOX) injection  0.5 mg/kg Subcutaneous Q24H   furosemide  20 mg Oral BID   gabapentin  300 mg Oral TID   ibuprofen  400 mg Oral Q4H   lamoTRIgine  200 mg Oral QHS   lidocaine  2 patch Transdermal Q24H   mometasone-formoterol  2 puff Inhalation BID   pantoprazole  40 mg  Oral BID   polyethylene glycol  17 g Oral BID   senna-docusate  1 tablet Oral BID   topiramate  50 mg Oral QHS   traZODone  50 mg Oral QHS   valACYclovir  1,000 mg Oral Daily   venlafaxine XR  150 mg Oral Daily   Continuous Infusions:   LOS: 3 days    Time spent: 35 minutes    Iara Monds A Ziya Coonrod, MD [facility]   If 7PM-7AM, please contact night-coverage [email or web address]  08/15/2022, 4:23 PM

## 2022-08-15 NOTE — Progress Notes (Signed)
    Attending Progress Note  History: Brittany Cobb is s/p mechanical fall resulting in L1 compression fracture  HD5: Continued pain overnight. Started gabapentin and tizanidine.  HD4: She continues to have low back pain which is not well controlled by her current medication regimen.   Physical Exam: Vitals:   08/15/22 0033 08/15/22 0816  BP: (!) 117/58 125/65  Pulse:  70  Resp:  17  Temp:  98 F (36.7 C)  SpO2:  97%    AA Ox3 CNI  Strength:5/5 throughout BLE   Data:  Other tests/results:  CT L spine 08/11/2022 IMPRESSION: 1. Acute appearing superior endplate compression deformity at L1 with approximately 40% height loss. No evidence of retropulsion. No evidence of an epidural hematoma. 2. Moderate spinal canal stenosis at L4-L5 secondary to combination of a small disc bulge and ligamentum flavum hypertrophy. Moderate right-sided neural foraminal stenosis at L4-L5.     Electronically Signed   By: Marin Roberts M.D.   On: 08/11/2022 14:00   CT L spine 08/15/2022 IMPRESSION: 1. Unchanged recent L1 upper plate anterior wedge compression fracture with 30% loss of anterior height and no posterior height loss or retropulsion. 2. Osteopenia and degenerative change. 3. L4-5 severe acquired spinal stenosis with moderate to severe bilateral foraminal stenosis. 4. L3-4 mild-to-moderate spinal canal stenosis with moderate left-greater-than-right foraminal stenosis. 5. L5-S1 moderate to severe bilateral foraminal stenosis. 6. Nonobstructive left nephrolithiasis. 7. Aortic atherosclerosis.   Aortic Atherosclerosis (ICD10-I70.0).     Electronically Signed   By: Telford Nab M.D.   On: 08/15/2022 02:34  Assessment/Plan:  Brittany Cobb is a 52 year old presenting after mechanical fall resulting in L1 compression fracture with persistent back pain  - mobilize - pain control; scheduled Tylenol and Advil.  Added gabapentin and PRN tizanidine.  Continue narcotics PRN. -  Patient declined kyphoplasty - Will follow-up outpatient with repeat x-rays. - No plan for acute neurosurgical intervention at this time.  Meade Maw MD Department of Neurosurgery

## 2022-08-15 NOTE — Progress Notes (Signed)
PT Cancellation Note  Patient Details Name: Brittany Cobb MRN: [id number] DOB: May 05, 1971   Cancelled Treatment:    Reason Eval/Treat Not Completed: Pain limiting ability to participate. Attempted to treat pt before lunch, pt declined and requested this author come back later.  2nd attempt made at 4 pm, pt declined reporting fatigue and not wanting to due to just getting back into bed from being up with nursing performing hygiene type activities.   Bjorn Loser, PTA  08/15/22, 4:06 PM

## 2022-08-16 DIAGNOSIS — R52 Pain, unspecified: Secondary | ICD-10-CM | POA: Diagnosis not present

## 2022-08-16 LAB — PHOSPHORUS: Phosphorus: 3.8 mg/dL (ref 2.5–4.6)

## 2022-08-16 LAB — BASIC METABOLIC PANEL
Anion gap: 6 (ref 5–15)
BUN: 16 mg/dL (ref 6–20)
CO2: 28 mmol/L (ref 22–32)
Calcium: 8.5 mg/dL — ABNORMAL LOW (ref 8.9–10.3)
Chloride: 105 mmol/L (ref 98–111)
Creatinine, Ser: 0.7 mg/dL (ref 0.44–1.00)
GFR, Estimated: >60 mL/min (ref >60)
Glucose, Bld: 107 mg/dL — ABNORMAL HIGH (ref 70–99)
Potassium: 3.4 mmol/L — ABNORMAL LOW (ref 3.5–5.1)
Sodium: 139 mmol/L (ref 135–145)

## 2022-08-16 LAB — MAGNESIUM: Magnesium: 1.9 mg/dL (ref 1.7–2.4)

## 2022-08-16 LAB — VITAMIN B12: Vitamin B-12: 351 pg/mL (ref 180–914)

## 2022-08-16 LAB — VITAMIN D 25 HYDROXY (VIT D DEFICIENCY, FRACTURES): Vit D, 25-Hydroxy: 29.99 ng/mL — ABNORMAL LOW (ref 30–100)

## 2022-08-16 MED ORDER — POTASSIUM CHLORIDE CRYS ER 20 MEQ PO TBCR
40.0000 meq | EXTENDED_RELEASE_TABLET | Freq: Once | ORAL | Status: AC
  Administered 2022-08-16: 40 meq via ORAL
  Filled 2022-08-16: qty 2

## 2022-08-16 MED ORDER — AMOXICILLIN-POT CLAVULANATE 875-125 MG PO TABS
1.0000 | ORAL_TABLET | Freq: Two times a day (BID) | ORAL | Status: AC
  Administered 2022-08-16 – 2022-08-18 (×6): 1 via ORAL
  Filled 2022-08-16 (×6): qty 1

## 2022-08-16 MED ORDER — CALCITONIN (SALMON) 200 UNIT/ACT NA SOLN
1.0000 | Freq: Every day | NASAL | Status: DC
  Administered 2022-08-17 – 2022-08-26 (×10): 1 via NASAL
  Filled 2022-08-16: qty 3.7

## 2022-08-16 NOTE — Progress Notes (Signed)
Physical Therapy Treatment Patient Details Name: Brittany Cobb MRN: [id number] DOB: May 19, 1971 Today's Date: 08/16/2022   History of Present Illness Brittany Cobb is a 52 year old female with history of depression, anxiety, hypertension, GERD, morbid obesity, history of urinary retention, with no history of asthma, who presents emergency department for chief concerns of new acute low back pain. Patient slipped and fell on her bottom and has had difficulty ambulating since.  Found to have L1 compression fracture.    PT Comments    Pt agrees to session.  "I am tired of telling people I hurt and nobody listens to me."  Encouragement given.  She is able to get to EOB with rail and supervision.  Steady in sitting.  Stood to [employer] with min a x 2 for safety and once up overall does well with good balance.  She is unable to step but is able to slowly shimmy feet to recliner at bedside.  Son arrives.  She remains in chair with needs met.   Recommendations for follow up therapy are one component of a multi-disciplinary discharge planning process, led by the attending physician.  Recommendations may be updated based on patient status, additional functional criteria and insurance authorization.  Follow Up Recommendations  Skilled nursing-short term rehab (<3 hours/day) Can patient physically be transported by private vehicle: No   Assistance Recommended at Discharge Intermittent Supervision/Assistance  Patient can return home with the following A lot of help with walking and/or transfers;A lot of help with bathing/dressing/bathroom;Help with stairs or ramp for entrance;Assist for transportation;Assistance with cooking/housework   Equipment Recommendations  Rolling walker (2 wheels);BSC/3in1    Recommendations for Other Services       Precautions / Restrictions Precautions Precautions: Knee;Back;Fall Precaution Booklet Issued: No Required Braces or Orthoses: Spinal Brace Spinal Brace: Lumbar  corset Restrictions Weight Bearing Restrictions: No     Mobility  Bed Mobility Overal bed mobility: Modified Independent Bed Mobility: Supine to Sit     Supine to sit: Modified independent (Device/Increase time)          Transfers Overall transfer level: Needs assistance Equipment used: Rolling walker (2 wheels) Transfers: Sit to/from Stand Sit to Stand: Min assist, +2 physical assistance                Ambulation/Gait Ambulation/Gait assistance: Min assist, +2 physical assistance Gait Distance (Feet): 2 Feet Assistive device: Rolling walker (2 wheels)   Gait velocity: decreased     General Gait Details: no true steps, slides feet to chair vs steps.   Stairs             Wheelchair Mobility    Modified Rankin (Stroke Patients Only)       Balance Overall balance assessment: Needs assistance Sitting-balance support: Feet supported Sitting balance-Leahy Scale: Normal     Standing balance support: Bilateral upper extremity supported, During functional activity, Reliant on assistive device for balance Standing balance-Leahy Scale: Good Standing balance comment: +2 for safety with walker                            Cognition Arousal/Alertness: Awake/alert Behavior During Therapy: WFL for tasks assessed/performed Overall Cognitive Status: Within Functional Limits for tasks assessed                                          Exercises  General Comments        Pertinent Vitals/Pain Pain Assessment Pain Assessment: Faces Faces Pain Scale: Hurts whole lot Pain Location: R knee Pain Descriptors / Indicators: Aching, Grimacing, Guarding Pain Intervention(s): Limited activity within patient's tolerance, Repositioned, Monitored during session    Home Living                          Prior Function            PT Goals (current goals can now be found in the care plan section) Progress towards PT  goals: Progressing toward goals    Frequency    7X/week      PT Plan Current plan remains appropriate    Co-evaluation              AM-PAC PT "6 Clicks" Mobility   Outcome Measure  Help needed turning from your back to your side while in a flat bed without using bedrails?: A Little Help needed moving from lying on your back to sitting on the side of a flat bed without using bedrails?: A Little Help needed moving to and from a bed to a chair (including a wheelchair)?: A Little Help needed standing up from a chair using your arms (e.g., wheelchair or bedside chair)?: A Little Help needed to walk in hospital room?: A Lot Help needed climbing 3-5 steps with a railing? : Total 6 Click Score: 15    End of Session Equipment Utilized During Treatment: Gait belt Activity Tolerance: Patient limited by pain Patient left: in chair;with call bell/phone within reach;with chair alarm set;with family/visitor present Nurse Communication: Mobility status PT Visit Diagnosis: Other abnormalities of gait and mobility (R26.89);History of falling (Z91.81);Difficulty in walking, not elsewhere classified (R26.2);Pain Pain - Right/Left: Right Pain - part of body: Knee     Time: 1055-1106 PT Time Calculation (min) (ACUTE ONLY): 11 min  Charges:  $Therapeutic Activity: 8-22 mins                   Chesley Noon, PTA 08/16/22, 11:33 AM

## 2022-08-16 NOTE — Progress Notes (Signed)
    Attending Progress Note  History: [name] is s/p mechanical fall resulting in L1 compression fracture  HD6: Had a difficult night.  +BM.  HD5: Continued pain overnight. Started gabapentin and tizanidine.  HD4: She continues to have low back pain which is not well controlled by her current medication regimen.   Physical Exam: Vitals:   08/16/22 0039 08/16/22 0743  BP: (!) 122/47 (!) 135/59  Pulse: 65 69  Resp: 18 16  Temp: 97.9 F (36.6 C) 98.2 F (36.8 C)  SpO2: 99% 98%    AA Ox3 CNI  Strength:5/5 throughout BLE   Data:  Other tests/results:  CT L spine 08/11/2022 IMPRESSION: 1. Acute appearing superior endplate compression deformity at L1 with approximately 40% height loss. No evidence of retropulsion. No evidence of an epidural hematoma. 2. Moderate spinal canal stenosis at L4-L5 secondary to combination of a small disc bulge and ligamentum flavum hypertrophy. Moderate right-sided neural foraminal stenosis at L4-L5.     Electronically Signed   By: Marin Roberts M.D.   On: 08/11/2022 14:00   CT L spine 08/15/2022 IMPRESSION: 1. Unchanged recent L1 upper plate anterior wedge compression fracture with 30% loss of anterior height and no posterior height loss or retropulsion. 2. Osteopenia and degenerative change. 3. L4-5 severe acquired spinal stenosis with moderate to severe bilateral foraminal stenosis. 4. L3-4 mild-to-moderate spinal canal stenosis with moderate left-greater-than-right foraminal stenosis. 5. L5-S1 moderate to severe bilateral foraminal stenosis. 6. Nonobstructive left nephrolithiasis. 7. Aortic atherosclerosis.   Aortic Atherosclerosis (ICD10-I70.0).     Electronically Signed   By: Telford Nab M.D.   On: 08/15/2022 02:34  Assessment/Plan:  BRENLEIGH SANTANA is a 52 year old presenting after mechanical fall resulting in L1 compression fracture with persistent back pain  - mobilize - pain control; scheduled Tylenol and Advil.   Added gabapentin and PRN tizanidine.  Continue narcotics PRN. - Patient declined kyphoplasty - Will follow-up outpatient with repeat x-rays. - No plan for acute neurosurgical intervention at this time. - OK to consider dispo once able to ambulate safely  Meade Maw MD Department of Neurosurgery

## 2022-08-16 NOTE — Progress Notes (Signed)
[facility] Progress Note  Patient: Brittany Cobb    [id number]  DOA: 08/11/2022     Date of Service: the patient was seen and examined on 08/16/2022  Chief Complaint  Patient presents with   Back Pain   Brief hospital course: 52 year old with past medical history significant for depression, anxiety, hypertension, GERD, morbid obesity, history of urinary retention, who presents to the ED complaining of acute low back pain.  She slipped and fell onto her back  and had difficulty ambulating since.     She was found to have Acute appearing superior endplate compression deformity at L1 with approximately 40% height loss. No evidence of retropulsion. No evidence of an epidural hematoma.  Moderate spinal canal stenosis at L4-L5 secondary to combination of a small disc bulge and ligamentum flavum hypertrophy. Moderate right-sided neural foraminal stenosis at L4-L5.  Assessment and Plan: Principal Problem:   Inadequate pain control Active Problems:   Obesity, Class III, BMI 40-49.9 (morbid obesity) (HCC)   Essential hypertension   GERD (gastroesophageal reflux disease)   Anxiety   Grieving   Compression fracture of L1 lumbar vertebra, closed, initial encounter ([place])     # Back pain: L1 Compression Fracture:  CT lumbar spine: Acute appearing superior endplate compression deformity at L1 with approximately 40% height loss. No evidence of retropulsion. No evidence of an epidural hematoma.  Moderate spinal canal stenosis at L4-L5 secondary to combination of a small disc bulge and ligamentum flavum hypertrophy. Moderate right-sided neural foraminal stenosis at L4-L5. -neurosurgery recommend pain management and LSO brace.  -Pain not controlled, continue with morphine -lidocaine Patch.  -Received  Toradol IV for two days.  -Continue with oxycodone. Neurosurgery increase dose 2/23. -PT /OT consulted, recommend SNF< patient unable to afford SNF> and would like to go home.  -still having  pain, unable to put weight right leg due to pain.  -Repeated CT spine fracture stable.  Started on Gabapentin and Zanaflex 2/23 by neurosurgery.  Decline Kyphoplasty.  Unable to ambulate due to pain  Continue Ibuprofen to Q 8 hours for 2 days. monitor renal function.   2/25 started calcitonin nasal spray   # Cellulitis, patient has mild arrhythmia right antiviral.  After IV insertion Started Augmentin twice daily for 3 days   # Anxiety: Continue with trazodone, Venlafaxine  and lorazepam as needed   # GERD: Continue with PPI. # Essential hypertension: Continue with amlodipine and furosemide.  As needed hydralazine # Obesity class III BMI 49: Need lifestyle modification # Constipation: Continue  Miralax, senna. Dulcolax supp   # Bilateral Knee pain: right Knee CT negative for fracture, osteoarthritis.  Voltaren gel ordered.  Plan to get X ray left knee.    # Grieving - Her mother passed in November 2023 from fistula, sepsis   # Morbid obesity, Body mass index is 49.42 kg/m.  Interventions: Calorie restricted diet and daily exercise advised to lose body weight after recovery      Diet: Regular diet DVT Prophylaxis: Subcutaneous Lovenox   Advance goals of care discussion: Full code  Family Communication: family was present at bedside, at the time of interview.  The pt provided permission to discuss medical plan with the family. Opportunity was given to ask question and all questions were answered satisfactorily.   Disposition:  Pt is from Home, admitted with fall and L1 fracture, still has significant pain, unable to ambulate, which precludes a safe discharge. Discharge to TBD, when clinically stable.  Subjective: No significant events overnight,  patient is still having significant pain 15/10 as she was moved in the bed by RN.  Patient said that she is unable to given stand up and put pressure on her right foot, feeling lower back pain radiating to the left leg with  sciatica.  Patient was also having some infection in the right antecubital fossa due to IV, she was afraid of getting cellulitis. Patient is still thinking about kyphoplasty. Patient denies any other complaints.   Physical Exam: General: NAD, lying comfortably Appear in no distress, affect appropriate Eyes: PERRLA ENT: Oral Mucosa Clear, moist  Neck: no JVD,  Cardiovascular: S1 and S2 Present, no Murmur,  Respiratory: good respiratory effort, Bilateral Air entry equal and Decreased, no Crackles, no wheezes Abdomen: Bowel Sound present, Soft and no tenderness,  Skin: no rashes Extremities: no Pedal edema, no calf tenderness Neurologic: without any new focal findings Gait not checked due to patient safety concerns  Vitals:   08/15/22 0816 08/15/22 1800 08/16/22 0039 08/16/22 0743  BP: 125/65 (!) 117/51 (!) 122/47 (!) 135/59  Pulse: 70 67 65 69  Resp: '17 18 18 16  '$ Temp: 98 F (36.7 C)  97.9 F (36.6 C) 98.2 F (36.8 C)  TempSrc:      SpO2: 97% 98% 99% 98%  Weight:      Height:        Intake/Output Summary (Last 24 hours) at 08/16/2022 1537 Last data filed at 08/16/2022 1330 Gross per 24 hour  Intake --  Output 2100 ml  Net -2100 ml   Filed Weights   08/11/22 1218  Weight: (!) 147.4 kg    Data Reviewed: I have personally reviewed and interpreted daily labs, tele strips, imagings as discussed above. I reviewed all nursing notes, pharmacy notes, vitals, pertinent old records I have discussed plan of care as described above with RN and patient/family.  CBC: Recent Labs  Lab 08/11/22 1455 08/12/22 0416  WBC 7.9 7.1  NEUTROABS 5.0  --   HGB 13.5 12.9  HCT 41.1 39.7  MCV 88.6 89.4  PLT 243 [id number]   Basic Metabolic Panel: Recent Labs  Lab 08/11/22 1455 08/12/22 0416 08/16/22 0504  NA 141 139 139  K 3.6 3.5 3.4*  CL 106 103 105  CO2 '27 28 28  '$ GLUCOSE 97 131* 107*  BUN '11 14 16  '$ CREATININE 0.65 0.82 0.70  CALCIUM 8.9 8.8* 8.5*  MG  --   --  1.9  PHOS  --    --  3.8    Studies: No results found.  Scheduled Meds:  acetaminophen  650 mg Oral Q6H   amLODipine  5 mg Oral Daily   amoxicillin-clavulanate  1 tablet Oral BID   diclofenac Sodium  2 g Topical QID   enoxaparin (LOVENOX) injection  0.5 mg/kg Subcutaneous Q24H   furosemide  20 mg Oral BID   gabapentin  300 mg Oral TID   ibuprofen  400 mg Oral Q8H   lamoTRIgine  200 mg Oral QHS   lidocaine  2 patch Transdermal Q24H   mometasone-formoterol  2 puff Inhalation BID   pantoprazole  40 mg Oral BID   polyethylene glycol  17 g Oral BID   senna-docusate  1 tablet Oral BID   topiramate  50 mg Oral QHS   traZODone  50 mg Oral QHS   valACYclovir  1,000 mg Oral Daily   venlafaxine XR  150 mg Oral Daily   Continuous Infusions: PRN Meds: albuterol, bisacodyl, busPIRone, LORazepam, melatonin, ondansetron **OR**  ondansetron (ZOFRAN) IV, mouth rinse, oxyCODONE, oxyCODONE, tiZANidine  Time spent: 35 minutes  Author: Val Riles. MD [facility] 08/16/2022 3:37 PM  To reach On-call, see care teams to locate the attending and reach out to them via [email or web address]. If 7PM-7AM, please contact night-coverage If you still have difficulty reaching the attending provider, please page the [facility] (Director on Call) for [facility] on amion for assistance.

## 2022-08-16 NOTE — Plan of Care (Signed)

## 2022-08-17 DIAGNOSIS — R52 Pain, unspecified: Secondary | ICD-10-CM | POA: Diagnosis not present

## 2022-08-17 LAB — MAGNESIUM: Magnesium: 2.1 mg/dL (ref 1.7–2.4)

## 2022-08-17 LAB — CBC
HCT: 36.3 % (ref 36.0–46.0)
Hemoglobin: 11.8 g/dL — ABNORMAL LOW (ref 12.0–15.0)
MCH: 29.1 pg (ref 26.0–34.0)
MCHC: 32.5 g/dL (ref 30.0–36.0)
MCV: 89.4 fL (ref 80.0–100.0)
Platelets: 230 10*3/uL (ref 150–400)
RBC: 4.06 MIL/uL (ref 3.87–5.11)
RDW: 13.2 % (ref 11.5–15.5)
WBC: 6 10*3/uL (ref 4.0–10.5)
nRBC: 0 % (ref 0.0–0.2)

## 2022-08-17 LAB — BASIC METABOLIC PANEL
Anion gap: 7 (ref 5–15)
BUN: 17 mg/dL (ref 6–20)
CO2: 27 mmol/L (ref 22–32)
Calcium: 8.7 mg/dL — ABNORMAL LOW (ref 8.9–10.3)
Chloride: 104 mmol/L (ref 98–111)
Creatinine, Ser: 0.69 mg/dL (ref 0.44–1.00)
GFR, Estimated: >60 mL/min (ref >60)
Glucose, Bld: 104 mg/dL — ABNORMAL HIGH (ref 70–99)
Potassium: 3.4 mmol/L — ABNORMAL LOW (ref 3.5–5.1)
Sodium: 138 mmol/L (ref 135–145)

## 2022-08-17 LAB — PHOSPHORUS: Phosphorus: 4.6 mg/dL (ref 2.5–4.6)

## 2022-08-17 MED ORDER — POTASSIUM CHLORIDE CRYS ER 20 MEQ PO TBCR
40.0000 meq | EXTENDED_RELEASE_TABLET | Freq: Once | ORAL | Status: AC
  Administered 2022-08-17: 40 meq via ORAL
  Filled 2022-08-17: qty 2

## 2022-08-17 MED ORDER — VITAMIN B-12 1000 MCG PO TABS
1000.0000 ug | ORAL_TABLET | Freq: Every day | ORAL | Status: DC
  Administered 2022-08-17 – 2022-08-26 (×10): 1000 ug via ORAL
  Filled 2022-08-17 (×10): qty 1

## 2022-08-17 MED ORDER — NYSTATIN 100000 UNIT/GM EX POWD
Freq: Two times a day (BID) | CUTANEOUS | Status: DC
  Filled 2022-08-17 (×2): qty 15

## 2022-08-17 MED ORDER — VITAMIN D (ERGOCALCIFEROL) 1.25 MG (50000 UNIT) PO CAPS
50000.0000 [IU] | ORAL_CAPSULE | ORAL | Status: DC
  Administered 2022-08-17 – 2022-08-24 (×2): 50000 [IU] via ORAL
  Filled 2022-08-17 (×2): qty 1

## 2022-08-17 NOTE — Progress Notes (Signed)
[facility] Progress Note  Patient: Brittany Cobb    [id number]  DOA: 08/11/2022     Date of Service: the patient was seen and examined on 08/17/2022  Chief Complaint  Patient presents with   Back Pain   Brief hospital course: 52 year old with past medical history significant for depression, anxiety, hypertension, GERD, morbid obesity, history of urinary retention, who presents to the ED complaining of acute low back pain.  She slipped and fell onto her back  and had difficulty ambulating since.     She was found to have Acute appearing superior endplate compression deformity at L1 with approximately 40% height loss. No evidence of retropulsion. No evidence of an epidural hematoma.  Moderate spinal canal stenosis at L4-L5 secondary to combination of a small disc bulge and ligamentum flavum hypertrophy. Moderate right-sided neural foraminal stenosis at L4-L5.  Assessment and Plan: Principal Problem:   Inadequate pain control Active Problems:   Obesity, Class III, BMI 40-49.9 (morbid obesity) (HCC)   Essential hypertension   GERD (gastroesophageal reflux disease)   Anxiety   Grieving   Compression fracture of L1 lumbar vertebra, closed, initial encounter ([place])     # Back pain: L1 Compression Fracture:  CT lumbar spine: Acute appearing superior endplate compression deformity at L1 with approximately 40% height loss. No evidence of retropulsion. No evidence of an epidural hematoma.  Moderate spinal canal stenosis at L4-L5 secondary to combination of a small disc bulge and ligamentum flavum hypertrophy. Moderate right-sided neural foraminal stenosis at L4-L5. -neurosurgery recommend pain management and LSO brace.  -Pain not controlled, continue with morphine -lidocaine Patch.  -Received  Toradol IV for two days.  -Continue with oxycodone. Neurosurgery increase dose 2/23. -PT /OT consulted, recommend SNF< patient unable to afford SNF> and would like to go home.  -still having  pain, unable to put weight right leg due to pain.  -Repeated CT spine fracture stable.  Started on Gabapentin and Zanaflex 2/23 by neurosurgery.  Decline Kyphoplasty.  Unable to ambulate due to pain  Continue Ibuprofen to Q 8 hours for 2 days. monitor renal function.  2/25 started calcitonin nasal spray 2/26 IR consulted for, present, patient agreed for the procedure today.  As per IR patient may need to stay for few days as she will need insurance authorization.  Otherwise it can be done as an outpatient.   # Cellulitis, patient has mild arrhythmia right antiviral.  After IV insertion Started Augmentin twice daily for 3 days   # Anxiety: Continue with trazodone, Venlafaxine  and lorazepam as needed   # GERD: Continue with PPI. # Essential hypertension: Continue with amlodipine and furosemide.  As needed hydralazine # Obesity class III BMI 49: Need lifestyle modification # Constipation: Continue  Miralax, senna. Dulcolax supp   # Bilateral Knee pain: right Knee CT negative for fracture, osteoarthritis.  Voltaren gel ordered.  Plan to get X ray left knee.    # Grieving - Her mother passed in November 2023 from fistula, sepsis   # Morbid obesity, Body mass index is 49.42 kg/m.  Interventions: Calorie restricted diet and daily exercise advised to lose body weight after recovery     Diet: Regular diet DVT Prophylaxis: Subcutaneous Lovenox   Advance goals of care discussion: Full code  Family Communication: family was present at bedside, at the time of interview.  The pt provided permission to discuss medical plan with the family. Opportunity was given to ask question and all questions were answered satisfactorily.  Disposition:  Pt is from Home, admitted with fall and L1 fracture, still has significant pain, unable to ambulate, which precludes a safe discharge. Discharge to TBD, when clinically stable.  Subjective: No significant events overnight, patient was complaining of  back pain 13/10, patient was able to sit in the edge of the bed and provide and use the bedside commode but she is in excruciating pain.  Patient agreed for the kyphoplasty.  IR consulted.  Patient denies any other active issues.   Physical Exam: General: NAD, lying comfortably Appear in no distress, affect appropriate Eyes: PERRLA ENT: Oral Mucosa Clear, moist  Neck: no JVD,  Cardiovascular: S1 and S2 Present, no Murmur,  Respiratory: good respiratory effort, Bilateral Air entry equal and Decreased, no Crackles, no wheezes Abdomen: Bowel Sound present, Soft and no tenderness,  Skin: no rashes Extremities: no Pedal edema, no calf tenderness Neurologic: without any new focal findings Gait not checked due to patient safety concerns  Vitals:   08/16/22 0743 08/16/22 1605 08/16/22 2358 08/17/22 0740  BP: (!) 135/59 (!) 150/93 111/62 122/72  Pulse: 69 81 76 63  Resp: '16 16 16 17  '$ Temp: 98.2 F (36.8 C) 98.9 F (37.2 C) 98.7 F (37.1 C) 98.1 F (36.7 C)  TempSrc:    Oral  SpO2: 98% 96% 92% 97%  Weight:      Height:        Intake/Output Summary (Last 24 hours) at 08/17/2022 1411 Last data filed at 08/17/2022 0810 Gross per 24 hour  Intake --  Output 850 ml  Net -850 ml   Filed Weights   08/11/22 1218  Weight: (!) 147.4 kg    Data Reviewed: I have personally reviewed and interpreted daily labs, tele strips, imagings as discussed above. I reviewed all nursing notes, pharmacy notes, vitals, pertinent old records I have discussed plan of care as described above with RN and patient/family.  CBC: Recent Labs  Lab 08/11/22 1455 08/12/22 0416 08/17/22 0322  WBC 7.9 7.1 6.0  NEUTROABS 5.0  --   --   HGB 13.5 12.9 11.8*  HCT 41.1 39.7 36.3  MCV 88.6 89.4 89.4  PLT 243 [id number]   Basic Metabolic Panel: Recent Labs  Lab 08/11/22 1455 08/12/22 0416 08/16/22 0504 08/17/22 0322  NA 141 139 139 138  K 3.6 3.5 3.4* 3.4*  CL 106 103 105 104  CO2 '27 28 28 27  '$ GLUCOSE 97  131* 107* 104*  BUN '11 14 16 17  '$ CREATININE 0.65 0.82 0.70 0.69  CALCIUM 8.9 8.8* 8.5* 8.7*  MG  --   --  1.9 2.1  PHOS  --   --  3.8 4.6    Studies: No results found.  Scheduled Meds:  acetaminophen  650 mg Oral Q6H   amLODipine  5 mg Oral Daily   amoxicillin-clavulanate  1 tablet Oral BID   calcitonin (salmon)  1 spray Alternating Nares Daily   vitamin B-12  1,000 mcg Oral Daily   diclofenac Sodium  2 g Topical QID   enoxaparin (LOVENOX) injection  0.5 mg/kg Subcutaneous Q24H   furosemide  20 mg Oral BID   gabapentin  300 mg Oral TID   lamoTRIgine  200 mg Oral QHS   mometasone-formoterol  2 puff Inhalation BID   nystatin   Topical BID   pantoprazole  40 mg Oral BID   polyethylene glycol  17 g Oral BID   senna-docusate  1 tablet Oral BID   topiramate  50 mg  Oral QHS   traZODone  50 mg Oral QHS   valACYclovir  1,000 mg Oral Daily   venlafaxine XR  150 mg Oral Daily   Vitamin D (Ergocalciferol)  50,000 Units Oral Q7 days   Continuous Infusions: PRN Meds: albuterol, bisacodyl, busPIRone, LORazepam, mouth rinse, oxyCODONE, oxyCODONE, tiZANidine  Time spent: 35 minutes  Author: Val Riles. MD [facility] 08/17/2022 2:11 PM  To reach On-call, see care teams to locate the attending and reach out to them via [email or web address]. If 7PM-7AM, please contact night-coverage If you still have difficulty reaching the attending provider, please page the [facility] (Director on Call) for [facility] on amion for assistance.

## 2022-08-17 NOTE — Progress Notes (Signed)
Occupational Therapy Treatment Patient Details Name: Brittany Cobb MRN: [id number] DOB: July 14, 1970 Today's Date: 08/17/2022   History of present illness Brittany Cobb is a 52 year old female with history of depression, anxiety, hypertension, GERD, morbid obesity, history of urinary retention, with no history of asthma, who presents emergency department for chief concerns of new acute low back pain. Patient slipped and fell on her bottom and has had difficulty ambulating since.  Found to have L1 compression fracture.   OT comments  Brittany Cobb was seen for OT/PT co-treatment on this date. Upon arrival to room pt reclined in bed, agreeable to tx. Pt requires  MOD A x2 + RW for BSC t/f and pericare standing, assist for balance. MIN A hand washing standing sink side, assist for balance. Pt making good progress toward goals, will continue to follow POC. Discharge recommendation remains appropriate.     Recommendations for follow up therapy are one component of a multi-disciplinary discharge planning process, led by the attending physician.  Recommendations may be updated based on patient status, additional functional criteria and insurance authorization.    Follow Up Recommendations  Skilled nursing-short term rehab (<3 hours/day)     Assistance Recommended at Discharge Intermittent Supervision/Assistance  Patient can return home with the following  A lot of help with walking and/or transfers;A lot of help with bathing/dressing/bathroom;Help with stairs or ramp for entrance   Equipment Recommendations  BSC/3in1    Recommendations for Other Services      Precautions / Restrictions Precautions Precautions: Knee;Back;Fall Precaution Booklet Issued: No Required Braces or Orthoses: Spinal Brace Spinal Brace: Lumbar corset Restrictions Weight Bearing Restrictions: No Other Position/Activity Restrictions: refused lumbar corset when offered       Mobility Bed Mobility Overal bed mobility:  Modified Independent Bed Mobility: Supine to Sit, Sit to Supine     Supine to sit: HOB elevated Sit to supine: HOB elevated   General bed mobility comments: heavy use of bed rails but is able to manage LE's on her own today    Transfers Overall transfer level: Needs assistance Equipment used: Rolling walker (2 wheels) Transfers: Sit to/from Stand Sit to Stand: Mod assist, +2 safety/equipment Stand pivot transfers: Mod assist, +2 physical assistance               Balance Overall balance assessment: Needs assistance Sitting-balance support: Feet supported Sitting balance-Leahy Scale: Normal     Standing balance support: Bilateral upper extremity supported, During functional activity, Reliant on assistive device for balance Standing balance-Leahy Scale: Fair Standing balance comment: heavy reliance on walker and emotional support gien throughout session                           ADL either performed or assessed with clinical judgement   ADL Overall ADL's : Needs assistance/impaired                                       General ADL Comments: MOD A x2 + RW for BSC t/f and pericare standing, assist for balance. MIN A hand washing standing sink side, assist for balance      Cognition Arousal/Alertness: Awake/alert Behavior During Therapy: WFL for tasks assessed/performed Overall Cognitive Status: Within Functional Limits for tasks assessed  Pertinent Vitals/ Pain       Pain Assessment Pain Assessment: 0-10 Pain Score: 6  Pain Location: R knee, L side back Pain Descriptors / Indicators: Aching, Grimacing, Guarding Pain Intervention(s): Limited activity within patient's tolerance, Repositioned   Frequency  Min 2X/week        Progress Toward Goals  OT Goals(current goals can now be found in the care plan section)  Progress towards OT goals: Progressing toward  goals  Acute Rehab OT Goals Patient Stated Goal: to go home OT Goal Formulation: With patient Time For Goal Achievement: 08/26/22 Potential to Achieve Goals: Good ADL Goals Pt Will Perform Grooming: with modified independence;standing Pt Will Perform Lower Body Dressing: with modified independence;sitting/lateral leans;with adaptive equipment Pt Will Transfer to Toilet: with modified independence;ambulating;regular height toilet  Plan Discharge plan remains appropriate;Frequency remains appropriate    Co-evaluation    PT/OT/SLP Co-Evaluation/Treatment: Yes Reason for Co-Treatment: To address functional/ADL transfers;For patient/therapist safety PT goals addressed during session: Mobility/safety with mobility;Balance;Proper use of DME OT goals addressed during session: ADL's and self-care      AM-PAC OT "6 Clicks" Daily Activity     Outcome Measure   Help from another person eating meals?: None Help from another person taking care of personal grooming?: A Little Help from another person toileting, which includes using toliet, bedpan, or urinal?: A Lot Help from another person bathing (including washing, rinsing, drying)?: A Lot Help from another person to put on and taking off regular upper body clothing?: None Help from another person to put on and taking off regular lower body clothing?: A Lot 6 Click Score: 17    End of Session Equipment Utilized During Treatment: Rolling walker (2 wheels)  OT Visit Diagnosis: Other abnormalities of gait and mobility (R26.89);Muscle weakness (generalized) (M62.81)   Activity Tolerance Patient tolerated treatment well   Patient Left in bed;with call bell/phone within reach   Nurse Communication Mobility status        Time: [id number] OT Time Calculation (min): 26 min  Charges: OT General Charges $OT Visit: 1 Visit OT Treatments $Self Care/Home Management : 8-22 mins  Brittany Cobb, M.S. OTR/L  08/17/22, 3:52 PM  ascom  [phone]

## 2022-08-17 NOTE — Progress Notes (Addendum)
Physical Therapy Treatment Patient Details Name: Brittany Cobb MRN: [id number] DOB: 1971-06-09 Today's Date: 08/17/2022   History of Present Illness Ms. Lasheka Bogdon is a 52 year old female with history of depression, anxiety, hypertension, GERD, morbid obesity, history of urinary retention, with no history of asthma, who presents emergency department for chief concerns of new acute low back pain. Patient slipped and fell on her bottom and has had difficulty ambulating since.  Found to have L1 compression fracture.    PT Comments     Co-tx with OT for safety and improved outcomes. 1 unit billed each per protocol.  Pt in bed stated she was up prior to [facility] with nursing for bathing and care.  Initially declined mobility but after ex and OT arrival in room she does agree to OOB.  She is able to get to EOB with HOB raised and use of rails with supervision.  Steady in sitting.  She is able to stand to RW with mod a x 1 and min guard of 1 during session.  Standing does progress during session with improved quality and less imbalances.  She is able to transition to [facility] with some improved steps today.  After voiding, stands and is able to provide her own self care with increased time, encouragement and repositioning of RLE.  She is able to turn to sink and wash hands in sink then transition backwards to bed.  Stands an additional time to reposition pads and then is able to get her legs up into bed on her own but with momentum and effort.  Overall progressing well with mobility today.  Continues to require assist for safety and balance.  She does state she is considering kyphoplasty now "I guess I have to, everyone keeps telling me how great it is."     Recommendations for follow up therapy are one component of a multi-disciplinary discharge planning process, led by the attending physician.  Recommendations may be updated based on patient status, additional functional criteria and insurance  authorization.  Follow Up Recommendations  Skilled nursing-short term rehab (<3 hours/day)     Assistance Recommended at Discharge    Patient can return home with the following A lot of help with walking and/or transfers;A lot of help with bathing/dressing/bathroom;Help with stairs or ramp for entrance;Assist for transportation;Assistance with cooking/housework   Equipment Recommendations  Rolling walker (2 wheels);BSC/3in1    Recommendations for Other Services       Precautions / Restrictions Precautions Precautions: Knee;Back;Fall Precaution Booklet Issued: No Required Braces or Orthoses: Spinal Brace Spinal Brace: Lumbar corset Restrictions Weight Bearing Restrictions: No Other Position/Activity Restrictions: refused lumbar corset when offered     Mobility  Bed Mobility Overal bed mobility: Modified Independent Bed Mobility: Supine to Sit, Sit to Supine     Supine to sit: HOB elevated Sit to supine: HOB elevated   General bed mobility comments: heavy use of bed rails but is able to manage LE's on her own today    Transfers Overall transfer level: Needs assistance Equipment used: Rolling walker (2 wheels) Transfers: Sit to/from Stand Sit to Stand: Mod assist, +2 safety/equipment                Ambulation/Gait Ambulation/Gait assistance: Min assist, +2 physical assistance, Min guard Gait Distance (Feet): 3 Feet Assistive device: Rolling walker (2 wheels)   Gait velocity: decreased     General Gait Details: does have some improved mobilty today and is able to turn to [facility] then to sink then  reposition back to sitting EOB   Stairs             Wheelchair Mobility    Modified Rankin (Stroke Patients Only)       Balance Overall balance assessment: Needs assistance Sitting-balance support: Feet supported Sitting balance-Leahy Scale: Normal     Standing balance support: Bilateral upper extremity supported, During functional activity, Reliant  on assistive device for balance Standing balance-Leahy Scale: Fair Standing balance comment: heavy reliance on walker and emotional support gien throughout session                            Cognition Arousal/Alertness: Awake/alert Behavior During Therapy: WFL for tasks assessed/performed Overall Cognitive Status: Within Functional Limits for tasks assessed                                          Exercises      General Comments        Pertinent Vitals/Pain Pain Assessment Pain Assessment: Faces Faces Pain Scale: Hurts even more Pain Location: R knee, L side back Pain Descriptors / Indicators: Aching, Grimacing, Guarding Pain Intervention(s): Limited activity within patient's tolerance, Monitored during session, Repositioned    Home Living                          Prior Function            PT Goals (current goals can now be found in the care plan section) Progress towards PT goals: Progressing toward goals    Frequency    7X/week      PT Plan Current plan remains appropriate    Co-evaluation PT/OT/SLP Co-Evaluation/Treatment: Yes            AM-PAC PT "6 Clicks" Mobility   Outcome Measure  Help needed turning from your back to your side while in a flat bed without using bedrails?: A Little Help needed moving from lying on your back to sitting on the side of a flat bed without using bedrails?: A Little Help needed moving to and from a bed to a chair (including a wheelchair)?: A Little Help needed standing up from a chair using your arms (e.g., wheelchair or bedside chair)?: A Little Help needed to walk in hospital room?: A Lot Help needed climbing 3-5 steps with a railing? : Total 6 Click Score: 15    End of Session Equipment Utilized During Treatment: Gait belt Activity Tolerance: Patient limited by pain Patient left: in bed;with call bell/phone within reach;with bed alarm set;with family/visitor present Nurse  Communication: Mobility status PT Visit Diagnosis: Other abnormalities of gait and mobility (R26.89);History of falling (Z91.81);Difficulty in walking, not elsewhere classified (R26.2);Pain Pain - Right/Left: Right Pain - part of body: Knee     Time: 1325-1355 PT Time Calculation (min) (ACUTE ONLY): 30 min  Charges:  $Therapeutic Activity: 8-22 mins                   Chesley Noon, PTA 08/17/22, 2:18 PM

## 2022-08-17 NOTE — Plan of Care (Signed)
  Problem: Skin Integrity: Goal: Risk for impaired skin integrity will decrease Outcome: Progressing   Problem: Safety: Goal: Ability to remain free from injury will improve Outcome: Progressing   Problem: Pain Managment: Goal: General experience of comfort will improve Outcome: Progressing   Problem: Elimination: Goal: Will not experience complications related to bowel motility Outcome: Progressing   Problem: Activity: Goal: Risk for activity intolerance will decrease Outcome: Progressing   Problem: Clinical Measurements: Goal: Ability to maintain clinical measurements within normal limits will improve Outcome: Progressing

## 2022-08-18 DIAGNOSIS — R52 Pain, unspecified: Secondary | ICD-10-CM | POA: Diagnosis not present

## 2022-08-18 LAB — CBC
HCT: 35.9 % — ABNORMAL LOW (ref 36.0–46.0)
Hemoglobin: 11.5 g/dL — ABNORMAL LOW (ref 12.0–15.0)
MCH: 29.1 pg (ref 26.0–34.0)
MCHC: 32 g/dL (ref 30.0–36.0)
MCV: 90.9 fL (ref 80.0–100.0)
Platelets: 226 10*3/uL (ref 150–400)
RBC: 3.95 MIL/uL (ref 3.87–5.11)
RDW: 13.2 % (ref 11.5–15.5)
WBC: 5.8 10*3/uL (ref 4.0–10.5)
nRBC: 0 % (ref 0.0–0.2)

## 2022-08-18 LAB — BASIC METABOLIC PANEL
Anion gap: 6 (ref 5–15)
BUN: 18 mg/dL (ref 6–20)
CO2: 29 mmol/L (ref 22–32)
Calcium: 8.5 mg/dL — ABNORMAL LOW (ref 8.9–10.3)
Chloride: 104 mmol/L (ref 98–111)
Creatinine, Ser: 0.88 mg/dL (ref 0.44–1.00)
GFR, Estimated: >60 mL/min (ref >60)
Glucose, Bld: 110 mg/dL — ABNORMAL HIGH (ref 70–99)
Potassium: 3.6 mmol/L (ref 3.5–5.1)
Sodium: 139 mmol/L (ref 135–145)

## 2022-08-18 LAB — MAGNESIUM: Magnesium: 2.3 mg/dL (ref 1.7–2.4)

## 2022-08-18 LAB — PHOSPHORUS: Phosphorus: 4 mg/dL (ref 2.5–4.6)

## 2022-08-18 MED ORDER — ENOXAPARIN SODIUM 80 MG/0.8ML IJ SOSY
0.5000 mg/kg | PREFILLED_SYRINGE | INTRAMUSCULAR | Status: AC
  Administered 2022-08-18 – 2022-08-19 (×2): 72.5 mg via SUBCUTANEOUS
  Filled 2022-08-18 (×2): qty 0.72

## 2022-08-18 NOTE — Progress Notes (Signed)
[facility] Progress Note  Patient: Brittany Cobb    [id number]  DOA: 08/11/2022     Date of Service: the patient was seen and examined on 08/18/2022  Chief Complaint  Patient presents with   Back Pain   Brief hospital course: 52 year old with past medical history significant for depression, anxiety, hypertension, GERD, morbid obesity, history of urinary retention, who presents to the ED complaining of acute low back pain.  She slipped and fell onto her back  and had difficulty ambulating since.     She was found to have Acute appearing superior endplate compression deformity at L1 with approximately 40% height loss. No evidence of retropulsion. No evidence of an epidural hematoma.  Moderate spinal canal stenosis at L4-L5 secondary to combination of a small disc bulge and ligamentum flavum hypertrophy. Moderate right-sided neural foraminal stenosis at L4-L5.  Assessment and Plan: Principal Problem:   Inadequate pain control Active Problems:   Obesity, Class III, BMI 40-49.9 (morbid obesity) (HCC)   Essential hypertension   GERD (gastroesophageal reflux disease)   Anxiety   Grieving   Compression fracture of L1 lumbar vertebra, closed, initial encounter ([place])     # Back pain: L1 Compression Fracture:  CT lumbar spine: Acute appearing superior endplate compression deformity at L1 with approximately 40% height loss. No evidence of retropulsion. No evidence of an epidural hematoma.  Moderate spinal canal stenosis at L4-L5 secondary to combination of a small disc bulge and ligamentum flavum hypertrophy. Moderate right-sided neural foraminal stenosis at L4-L5. -neurosurgery recommend pain management and LSO brace.  -Pain not controlled, continue with morphine -lidocaine Patch.  -Received  Toradol IV for two days.  -Continue with oxycodone. Neurosurgery increase dose 2/23. -PT /OT consulted, recommend SNF< patient unable to afford SNF> and would like to go home.  -still having  pain, unable to put weight right leg due to pain.  -Repeated CT spine fracture stable.  Started on Gabapentin and Zanaflex 2/23 by neurosurgery.  Decline Kyphoplasty.  Unable to ambulate due to pain  Continue Ibuprofen to Q 8 hours for 2 days. monitor renal function.  2/25 started calcitonin nasal spray 2/26 IR consulted for, present, patient agreed for the procedure today.  As per IR patient may need to stay for few days as they will need insurance authorization.  Otherwise it can be done as an outpatient.   # Cellulitis, patient has mild arrhythmia right antiviral.  After IV insertion Started Augmentin twice daily for 3 days   # Anxiety: Continue with trazodone, Venlafaxine  and lorazepam as needed   # GERD: Continue with PPI. # Essential hypertension: Continue with amlodipine and furosemide.  As needed hydralazine # Obesity class III BMI 49: Need lifestyle modification # Constipation: Continue  Miralax, senna. Dulcolax supp   # Bilateral Knee pain: right Knee CT negative for fracture, osteoarthritis.  Voltaren gel ordered.  Plan to get X ray left knee.    # Grieving - Her mother passed in November 2023 from fistula, sepsis  # Vitamin D level 30 at lower end so started vitamin D 50,000 units p.o. weekly, follow with PCP to repeat vitamin D level after 3 to 6 months. # Vitamin B12 level 351, goal >400, started vitamin B12 1000 mcg p.o. daily.   # Morbid obesity, Body mass index is 49.42 kg/m.  Interventions: Calorie restricted diet and daily exercise advised to lose body weight after recovery      Diet: Regular diet DVT Prophylaxis: Subcutaneous Lovenox   Advance  goals of care discussion: Full code  Family Communication: family was present at bedside, at the time of interview.  The pt provided permission to discuss medical plan with the family. Opportunity was given to ask question and all questions were answered satisfactorily.   Disposition:  Pt is from Home, admitted  with fall and L1 fracture, still has significant pain, unable to ambulate, IR consulted for kyphoplasty, which precludes a safe discharge. Discharge to TBD, when clinically stable.  Subjective: No significant events overnight, patient still having significant pain in the back 13/10, stated that she was sitting at the edge of the bed last night due to tender with a friend, she may overdid and also whenever she moves it hurts more.  Patient is trying to get out of the bed to use the bedside commode.  She seems to be motivated to move around. Patient denies any other complaints.  Patient is willing to stay in the hospital to get kyphoplasty done.  Patient does not have much help at home so unable to walk around.  Physical Exam: General: NAD, lying comfortably Appear in no distress, affect appropriate Eyes: PERRLA ENT: Oral Mucosa Clear, moist  Neck: no JVD,  Cardiovascular: S1 and S2 Present, no Murmur,  Respiratory: good respiratory effort, Bilateral Air entry equal and Decreased, no Crackles, no wheezes Abdomen: Bowel Sound present, Soft and no tenderness,  Skin: no rashes Extremities: no Pedal edema, no calf tenderness Neurologic: without any new focal findings Gait not checked due to patient safety concerns  Vitals:   08/17/22 0740 08/17/22 1516 08/18/22 0039 08/18/22 0727  BP: 122/72 (!) 101/53 (!) 104/57 (!) 140/64  Pulse: 63 78 74 73  Resp: '17 17 20 17  '$ Temp: 98.1 F (36.7 C) 97.8 F (36.6 C) (!) 97.5 F (36.4 C) 98.3 F (36.8 C)  TempSrc: Oral     SpO2: 97% 93% 93% 96%  Weight:      Height:        Intake/Output Summary (Last 24 hours) at 08/18/2022 1427 Last data filed at 08/18/2022 1000 Gross per 24 hour  Intake --  Output 800 ml  Net -800 ml   Filed Weights   08/11/22 1218  Weight: (!) 147.4 kg    Data Reviewed: I have personally reviewed and interpreted daily labs, tele strips, imagings as discussed above. I reviewed all nursing notes, pharmacy notes, vitals,  pertinent old records I have discussed plan of care as described above with RN and patient/family.  CBC: Recent Labs  Lab 08/11/22 1455 08/12/22 0416 08/17/22 0322 08/18/22 0302  WBC 7.9 7.1 6.0 5.8  NEUTROABS 5.0  --   --   --   HGB 13.5 12.9 11.8* 11.5*  HCT 41.1 39.7 36.3 35.9*  MCV 88.6 89.4 89.4 90.9  PLT 243 242 230 [id number]   Basic Metabolic Panel: Recent Labs  Lab 08/11/22 1455 08/12/22 0416 08/16/22 0504 08/17/22 0322 08/18/22 0302  NA 141 139 139 138 139  K 3.6 3.5 3.4* 3.4* 3.6  CL 106 103 105 104 104  CO2 '27 28 28 27 29  '$ GLUCOSE 97 131* 107* 104* 110*  BUN '11 14 16 17 18  '$ CREATININE 0.65 0.82 0.70 0.69 0.88  CALCIUM 8.9 8.8* 8.5* 8.7* 8.5*  MG  --   --  1.9 2.1 2.3  PHOS  --   --  3.8 4.6 4.0    Studies: No results found.  Scheduled Meds:  acetaminophen  650 mg Oral Q6H   amLODipine  5 mg Oral Daily   amoxicillin-clavulanate  1 tablet Oral BID   calcitonin (salmon)  1 spray Alternating Nares Daily   vitamin B-12  1,000 mcg Oral Daily   diclofenac Sodium  2 g Topical QID   enoxaparin (LOVENOX) injection  0.5 mg/kg Subcutaneous Q24H   furosemide  20 mg Oral BID   gabapentin  300 mg Oral TID   lamoTRIgine  200 mg Oral QHS   mometasone-formoterol  2 puff Inhalation BID   nystatin   Topical BID   pantoprazole  40 mg Oral BID   polyethylene glycol  17 g Oral BID   senna-docusate  1 tablet Oral BID   topiramate  50 mg Oral QHS   traZODone  50 mg Oral QHS   valACYclovir  1,000 mg Oral Daily   venlafaxine XR  150 mg Oral Daily   Vitamin D (Ergocalciferol)  50,000 Units Oral Q7 days   Continuous Infusions: PRN Meds: albuterol, bisacodyl, busPIRone, LORazepam, mouth rinse, oxyCODONE, oxyCODONE, tiZANidine  Time spent: 35 minutes  Author: Val Riles. MD [facility] 08/18/2022 2:27 PM  To reach On-call, see care teams to locate the attending and reach out to them via [email or web address]. If 7PM-7AM, please contact night-coverage If you still have  difficulty reaching the attending provider, please page the [facility] (Director on Call) for [facility] on amion for assistance.

## 2022-08-18 NOTE — Consult Note (Signed)
Chief Complaint: Patient was seen in consultation today for L1 compression fracture  at the request Val Riles, MD  Referring Physician(s): Val Riles, MD  Supervising Physician: Juliet Rude  Patient Status: [place] - In-pt  History of Present Illness: Brittany Cobb is a 52 y.o. female who presented to [facility] ED 08/11/2022 complaining of back and knee pain following mechanical fall with inability to ambulate.  CT lumbar spine at that time revealed acute appearing superior endplate compression deformity at L1 with approximately 40% height loss.  She was admitted for pain control and consulted with Elayne Guerin, neurosurgery, who recommended TSLO brace and PT with no need for surgical intervention. Pt continued to have severe pain not controlled with medication and inability to ambulate.  Patient was offered kyphoplasty treatment initially but refused.  She has now agreed to proceed with kyphoplasty. Request received for kyphoplasty of L1 compression fracture. Dr. Denna Haggard reviewed imaging and approved patient for kyphoplasty of L1 compression fracture. Insurance authorization approval received. Procedure tentatively scheduled 08/20/22.    Pt denies chills, CP, N/V, or dizziness.  She endorses fever, SOB, leg swelling, abd pain, constipaion, back pain, difficulty ambulating, HA and weakness.  She understands she must be NPO at [facility] 2/29.  Roland-Morris disability score :  24/24   Past Medical History:  Diagnosis Date   Acid reflux    Anxiety    Depression     Past Surgical History:  Procedure Laterality Date   BREAST BIOPSY Left 2014   benign with s shaped clip   ENDOMETRIAL ABLATION     KNEE SURGERY Left    TONSILLECTOMY     TUBAL LIGATION      Allergies: Bactrim [sulfamethoxazole-trimethoprim]  Medications: Prior to Admission medications   Medication Sig Start Date End Date Taking? Authorizing Provider  amLODipine (NORVASC) 5 MG tablet Take 5 mg by mouth daily.  07/21/22 07/16/23 Yes [provider]  budesonide-formoterol (SYMBICORT) 160-4.5 MCG/ACT inhaler Inhale 2 puffs into the lungs 2 (two) times daily.   Yes [provider]  busPIRone (BUSPAR) 15 MG tablet Take 15 mg by mouth 2 (two) times daily as needed. 08/05/22 08/05/23 Yes [provider]  furosemide (LASIX) 20 MG tablet Take 20 mg by mouth 2 (two) times daily. 07/21/22  Yes [provider]  HYDROcodone-acetaminophen (NORCO) 10-325 MG tablet Take 1 tablet by mouth every 8 (eight) hours as needed for moderate pain or severe pain.   Yes [provider]  lamoTRIgine (LAMICTAL) 200 MG tablet Take 200 mg by mouth at bedtime. 07/21/22 07/16/23 Yes [provider]  omeprazole (PRILOSEC) 20 MG capsule Take 20 mg by mouth in the morning and at bedtime.   Yes [provider]  topiramate (TOPAMAX) 50 MG tablet Take 50 mg by mouth at bedtime. 07/21/22  Yes [provider]  traZODone (DESYREL) 50 MG tablet Take 50 mg by mouth at bedtime. 07/21/22  Yes [provider]  valACYclovir (VALTREX) 1000 MG tablet Take 1,000 mg by mouth daily. 08/07/16  Yes [provider]  venlafaxine XR (EFFEXOR-XR) 150 MG 24 hr capsule Take 1 capsule by mouth daily. 07/21/22  Yes [provider]  VENTOLIN HFA 108 (90 Base) MCG/ACT inhaler  07/21/22  Yes [provider]  ALPRAZolam (XANAX) 0.5 MG tablet Take 0.5 mg by mouth at bedtime as needed for anxiety. Patient not taking: Reported on 08/11/2022    [provider]  fluconazole (DIFLUCAN) 150 MG tablet Take 1 tablet (150 mg total)  by mouth daily. Patient not taking: Reported on 08/11/2022 11/06/16   Norval Gable, MD  hydrochlorothiazide (HYDRODIURIL) 25 MG tablet Take 37.5 mg by mouth daily. Patient not taking: Reported on 08/11/2022    [provider]  LORazepam (ATIVAN) 0.5 MG tablet Take 0.5 mg by mouth every 8 (eight) hours as needed for anxiety. Patient not  taking: Reported on 08/11/2022 04/21/22   [provider]  nitrofurantoin, macrocrystal-monohydrate, (MACROBID) 100 MG capsule Take 1 capsule (100 mg total) by mouth 2 (two) times daily. Patient not taking: Reported on 08/11/2022 11/06/16   Norval Gable, MD     History reviewed. No pertinent family history.  Social History   Socioeconomic History   Marital status: Divorced    Spouse name: Not on file   Number of children: Not on file   Years of education: Not on file   Highest education level: Not on file  Occupational History   Not on file  Tobacco Use   Smoking status: Every Day    Packs/day: 0.50    Types: Cigarettes   Smokeless tobacco: Never  Substance and Sexual Activity   Alcohol use: No   Drug use: No   Sexual activity: Not Currently  Other Topics Concern   Not on file  Social History Narrative   Not on file   Social Determinants of Health   Financial Resource Strain: Not on file  Food Insecurity: No Food Insecurity (08/12/2022)   Hunger Vital Sign    Worried About Running Out of Food in the Last Year: Never true    Ran Out of Food in the Last Year: Never true  Transportation Needs: No Transportation Needs (08/13/2022)   PRAPARE - Hydrologist (Medical): No    Lack of Transportation (Non-Medical): No  Physical Activity: Not on file  Stress: Not on file  Social Connections: Not on file    Review of Systems: A 12 point ROS discussed and pertinent positives are indicated in the HPI above.  All other systems are negative.  Review of Systems  Constitutional:  Positive for appetite change, fatigue and fever. Negative for chills.  Respiratory:  Positive for shortness of breath.   Cardiovascular:  Positive for leg swelling. Negative for chest pain.  Gastrointestinal:  Positive for abdominal pain and constipation. Negative for nausea and vomiting.  Musculoskeletal:  Positive for back pain and gait problem.  Neurological:   Positive for weakness and headaches. Negative for dizziness.    Vital Signs: BP (!) 140/64 (BP Location: Right Arm)   Pulse 73   Temp 98.3 F (36.8 C)   Resp 17   Ht '5\' 8"'$  (1.727 m)   Wt (!) 325 lb (147.4 kg)   LMP 03/18/2015 (Approximate)   SpO2 96%   BMI 49.42 kg/m     Physical Exam Vitals reviewed.  Constitutional:      General: She is not in acute distress.    Appearance: Normal appearance. She is not ill-appearing.  HENT:     Head: Normocephalic and atraumatic.     Mouth/Throat:     Mouth: Mucous membranes are moist.     Pharynx: Oropharynx is clear.  Eyes:     Extraocular Movements: Extraocular movements intact.     Pupils: Pupils are equal, round, and reactive to light.  Cardiovascular:     Rate and Rhythm: Normal rate and regular rhythm.     Pulses: Normal pulses.     Heart sounds: Normal heart sounds. No  murmur heard. Pulmonary:     Effort: Pulmonary effort is normal.     Breath sounds: Normal breath sounds.  Abdominal:     General: Bowel sounds are normal. There is no distension.     Palpations: Abdomen is soft.     Tenderness: There is no abdominal tenderness. There is no guarding.  Musculoskeletal:     Right lower leg: Edema present.     Left lower leg: Edema present.  Skin:    General: Skin is warm and dry.  Neurological:     Mental Status: She is alert and oriented to person, place, and time.  Psychiatric:        Mood and Affect: Mood normal.        Behavior: Behavior normal.        Thought Content: Thought content normal.        Judgment: Judgment normal.     Imaging: DG Knee 1-2 Views Left  Result Date: 08/15/2022 CLINICAL DATA:  Left knee pain after fall. EXAM: LEFT KNEE - 1-2 VIEW COMPARISON:  None Available. FINDINGS: Moderate to severe medial compartment joint space narrowing. Mild-to-moderate peripheral medial compartment degenerative osteophytes. Minimal peripheral lateral compartment degenerative osteophytosis. Mild superior greater  than inferior patellar degenerative spurring. No joint effusion. No acute fracture or dislocation. 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 Electronically Signed   By: Yvonne Kendall M.D.   On: 08/15/2022 14:24   CT LUMBAR SPINE WO CONTRAST  Result Date: 08/15/2022 CLINICAL DATA:  L1 compression fracture follow-up. EXAM: CT LUMBAR SPINE WITHOUT CONTRAST TECHNIQUE: Multidetector CT imaging of the lumbar spine was performed without intravenous contrast administration. Multiplanar CT image reconstructions were also generated. RADIATION DOSE REDUCTION: This exam was performed according to the departmental dose-optimization program which includes automated exposure control, adjustment of the mA and/or kV according to patient size and/or use of iterative reconstruction technique. COMPARISON:  Lumbar spine CT 08/11/2022 FINDINGS: Segmentation: 5 lumbar type vertebrae. Alignment: Unchanged. There is slight dextroscoliosis and a minimal grade 1 anterolisthesis at L4-5 due to moderate to severe facet hypertrophy at that level. Vertebrae: Mild osteopenia. Again noted is a recent L1 upper plate anterior wedge compression fracture deformity, with loss of anterior height of 30%, and no posterior height loss or retropulsion. There has been no significant interval change. No spinal canal hematoma is seen. Other lumbar vertebra are normal in heights. Paraspinal and other soft tissues: Mild aortic atherosclerosis. Nonobstructive left nephrolithiasis. No paraspinal hematoma. No fluid collection or masses. Disc levels: There is preservation of the normal lumbar disc heights. There is no bulge, herniation or stenosis T11-12 through L2-3. Facet joint spurring is seen at L1-2 and L2-3 without foraminal encroachment. At L3-4, moderate facet spurring is seen and a mild circumferential disc bulge as well as dorsal epidural fat. There is mild-to-moderate secondary spinal canal stenosis and lateral recess  effacement. There is moderate left-greater-than-right foraminal stenosis. At L4-5 with severe acquired spinal stenosis is seen due to disc bulge, advanced facet hypertrophy and dorsal epidural fat. Moderate to severe bilateral foraminal stenosis. At L5-S1 no disc bulge or canal stenosis are seen. Moderately advanced facet hypertrophy causes bilateral moderate to severe foraminal stenosis. Other: Both SI joints are patent with mild spurring. The visualized sacrum shows no insufficiency fracture. IMPRESSION: 1. Unchanged recent L1 upper plate anterior wedge compression fracture with 30% loss of anterior height and no posterior height loss or retropulsion. 2. Osteopenia and degenerative change. 3. L4-5 severe acquired spinal stenosis with moderate to severe bilateral foraminal  stenosis. 4. L3-4 mild-to-moderate spinal canal stenosis with moderate left-greater-than-right foraminal stenosis. 5. L5-S1 moderate to severe bilateral foraminal stenosis. 6. Nonobstructive left nephrolithiasis. 7. Aortic atherosclerosis. Aortic Atherosclerosis (ICD10-I70.0). Electronically Signed   By: Telford Nab M.D.   On: 08/15/2022 02:34   CT Thoracic Spine Wo Contrast  Result Date: 08/11/2022 CLINICAL DATA:  Back pain after fall with known L1 vertebral body compression fracture EXAM: CT THORACIC SPINE WITHOUT CONTRAST TECHNIQUE: Multidetector CT images of the thoracic were obtained using the standard protocol without intravenous contrast. RADIATION DOSE REDUCTION: This exam was performed according to the departmental dose-optimization program which includes automated exposure control, adjustment of the mA and/or kV according to patient size and/or use of iterative reconstruction technique. COMPARISON:  Same day lumbar spine CT FINDINGS: Alignment: Preserved thoracic kyphosis without static listhesis. Vertebrae: Acute superior endplate compression fracture of the L1 vertebral body, as described on dedicated CT of the lumbar spine.  Thoracic vertebral body heights are maintained without evidence of fracture. No pathologic bone process. Paraspinal and other soft tissues: Negative. Disc levels: Thoracic intervertebral disc heights are preserved. Minimal facet joint hypertrophy within the lower thoracic spine. IMPRESSION: 1. No acute osseous abnormality of the thoracic spine. 2. Acute superior endplate compression fracture of the L1 vertebral body, as described on dedicated CT of the lumbar spine. Electronically Signed   By: Davina Poke D.O.   On: 08/11/2022 16:08   CT Knee Right Wo Contrast  Result Date: 08/11/2022 CLINICAL DATA:  Knee trauma, no prior imaging (Age >= 5y) Unable to ambulate EXAM: CT OF THE RIGHT KNEE WITHOUT CONTRAST TECHNIQUE: Multidetector CT imaging of the right knee was performed according to the standard protocol. Multiplanar CT image reconstructions were also generated. RADIATION DOSE REDUCTION: This exam was performed according to the departmental dose-optimization program which includes automated exposure control, adjustment of the mA and/or kV according to patient size and/or use of iterative reconstruction technique. COMPARISON:  None Available. FINDINGS: Bones/Joint/Cartilage There is no evidence of acute fracture. There is tricompartment osteophyte formation with moderate-severe medial compartment joint space narrowing. There is a trace joint effusion. There is a small ossified joint body in the superolateral knee measuring 6 mm. Ligaments Suboptimally assessed by CT. Muscles and Tendons No acute myotendinous abnormality by CT. Soft tissues No focal fluid collection. IMPRESSION: No evidence of acute fracture. 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 Trace joint effusion. Electronically Signed   By: Maurine Simmering M.D.   On: 08/11/2022 14:09   CT Lumbar Spine Wo Contrast  Result Date: 08/11/2022 CLINICAL DATA:  Back pain. EXAM: CT LUMBAR SPINE WITHOUT CONTRAST TECHNIQUE:  Multidetector CT imaging of the lumbar spine was performed without intravenous contrast administration. Multiplanar CT image reconstructions were also generated. RADIATION DOSE REDUCTION: This exam was performed according to the departmental dose-optimization program which includes automated exposure control, adjustment of the mA and/or kV according to patient size and/or use of iterative reconstruction technique. COMPARISON:  None Available. FINDINGS: Segmentation: 5 lumbar type vertebrae. Alignment: Trace anterolisthesis of L4 on L5. Vertebrae: There is an acute appearing superior endplate compression deformity at L1. There is no evidence of retropulsion. There is a proximally 40% height loss. No evidence of an epidural hematoma. Paraspinal and other soft tissues: Small left interpolar renal stone. No hydronephrosis. Disc levels: Moderate spinal canal stenosis at L4-L5 secondary to combination of a small disc bulge and ligamentum flavum hypertrophy. There is also moderate right-sided neural foraminal stenosis at L4-L5. IMPRESSION: 1. Acute appearing superior endplate  compression deformity at L1 with approximately 40% height loss. No evidence of retropulsion. No evidence of an epidural hematoma. 2. Moderate spinal canal stenosis at L4-L5 secondary to combination of a small disc bulge and ligamentum flavum hypertrophy. Moderate right-sided neural foraminal stenosis at L4-L5. Electronically Signed   By: Marin Roberts M.D.   On: 08/11/2022 14:00    Labs:  CBC: Recent Labs    08/11/22 1455 08/12/22 0416 08/17/22 0322 08/18/22 0302  WBC 7.9 7.1 6.0 5.8  HGB 13.5 12.9 11.8* 11.5*  HCT 41.1 39.7 36.3 35.9*  PLT 243 242 230 226    COAGS: No results for input(s): "INR", "APTT" in the last 8760 hours.  BMP: Recent Labs    08/12/22 0416 08/16/22 0504 08/17/22 0322 08/18/22 0302  NA 139 139 138 139  K 3.5 3.4* 3.4* 3.6  CL 103 105 104 104  CO2 '28 28 27 29  '$ GLUCOSE 131* 107* 104* 110*  BUN '14 16  17 18  '$ CALCIUM 8.8* 8.5* 8.7* 8.5*  CREATININE 0.82 0.70 0.69 0.88  GFRNONAA >60 >60 >60 >60    LIVER FUNCTION TESTS: No results for input(s): "BILITOT", "AST", "ALT", "ALKPHOS", "PROT", "ALBUMIN" in the last 8760 hours.  TUMOR MARKERS: No results for input(s): "AFPTM", "CEA", "CA199", "CHROMGRNA" in the last 8760 hours.  Assessment and Plan:  52 year old with PMHx significant for depression, anxiety, HTN, GERD, morbid obesity, urinary retention and recent L1 compression fracture from mechanical fall.  She presents to IR for kyphoplasty procedure.  Pt resting in bed with friends at bedside.  She is A&O and tearful.  She is in no distress.  NPO at [facility] 2/29 No blood thinning medication  Risks and benefits of kyphoplasty of L1 compression fracture with moderate sedation were discussed with the patient including, but not limited to education regarding the natural healing process of compression fractures without intervention, bleeding, infection, cement migration which may cause spinal cord damage, paralysis, pulmonary embolism or even death.  This interventional procedure involves the use of X-rays and because of the nature of the planned procedure, it is possible that we will have prolonged use of X-ray fluoroscopy.  Potential radiation risks to you include (but are not limited to) the following: - A slightly elevated risk for cancer  several years later in life. This risk is typically less than 0.5% percent. This risk is low in comparison to the normal incidence of human cancer, which is 33% for women and 50% for men according to the [employer]. - Radiation induced injury can include skin redness, resembling a rash, tissue breakdown / ulcers and hair loss (which can be temporary or permanent).   The likelihood of either of these occurring depends on the difficulty of the procedure and whether you are sensitive to radiation due to previous procedures, disease, or genetic  conditions.   IF your procedure requires a prolonged use of radiation, you will be notified and given written instructions for further action.  It is your responsibility to monitor the irradiated area for the 2 weeks following the procedure and to notify your physician if you are concerned that you have suffered a radiation induced injury.    All of the patient's questions were answered, patient is agreeable to proceed.  Consent signed and in chart.    Thank you for this interesting consult.  I greatly enjoyed meeting Brittany Cobb and look forward to participating in their care.  A copy of this report was sent to the requesting  provider on this date.  Electronically Signed: Tyson Alias, NP 08/18/2022, 1:11 PM   I spent a total of 20 minutes in face to face in clinical consultation, greater than 50% of which was counseling/coordinating care for L1 compression fracture.

## 2022-08-18 NOTE — TOC Progression Note (Signed)
Transition of Care [facility]) - Progression Note    Patient Details  Name: Brittany Cobb MRN: [id number] Date of Birth: 09-11-1970  Transition of Care [facility]) CM/SW [place], RN Phone Number: 08/18/2022, 2:03 PM  Clinical Narrative:    I reached out to Darnelle Maffucci at [facility] and asked that the RW and 3 in 1 be switched ot bariatric   Expected Discharge Plan: OP Rehab Barriers to Discharge: Inadequate or no insurance  Expected Discharge Plan and Services   Discharge Planning Services: CM Consult Post Acute Care Choice: [place] arrangements for the past 2 months: Mobile Home                 DME Arranged: Gilford Rile rolling, 3-N-1 DME Agency: [employer] Date DME Agency Contacted: 08/13/22 Time DME Agency Contacted: [phone] Representative spoke with at DME Agency: [place]: NA [place] Agency: NA Date [place]: 08/13/22 Time [place]: [id number] Representative spoke with at [place]: [place] Determinants of Health ([place]) Interventions SDOH Screenings   Food Insecurity: No Food Insecurity (08/12/2022)  Housing: Low Risk  (08/12/2022)  Transportation Needs: No Transportation Needs (08/13/2022)  Utilities: Not At Risk (08/12/2022)  Tobacco Use: High Risk (08/11/2022)    Readmission Risk Interventions     No data to display

## 2022-08-18 NOTE — Progress Notes (Signed)
Physical Therapy Treatment Patient Details Name: Brittany Cobb MRN: [id number] DOB: 1971/04/05 Today's Date: 08/18/2022   History of Present Illness [name] is a 52 year old female with history of depression, anxiety, hypertension, GERD, morbid obesity, history of urinary retention, with no history of asthma, who presents emergency department for chief concerns of new acute low back pain. Patient slipped and fell on her bottom and has had difficulty ambulating since.  Found to have L1 compression fracture.    PT Comments    Pt getting up to bathe with tech.  Took over care.  She is able to transfer to [facility] with RW then stand again to reposition to allow for better placement facing sink.  Min guard +1 today for transfers and generally much more steady on her feet.  She is encouraged to do her own bath in both sitting and standing as appropriate but does still need assist for back and posterior care.   OT arrived in room and took over session and to finish mobility tasks.  Pt is progressing well.  BSC and RW delivered to room are standard width.  Reached out to [facility] and requested bariatric size as she will struggle to fit in stnd [facility].  Gait remains limited to transfers only.  Did recommend Wheelchair if transition to home is planned vs SNF.  She does voice concern over wheelchair being able to navigate in home but to this point she has been unable to walk any functional distance.  She does have a ramp at home.  Relayed to TOC.  Will need larger WC size also if she opts for this.   Patient suffers from compression fractures which impairs his/her ability to perform daily activities like toileting, feeding, dressing, grooming, bathing in the home. A cane, walker, crutch will not resolve the patient's issue with performing activities of daily living. A lightweight wheelchair and cushion is required/recommended and will allow patient to safely perform daily activities.   Patient can safely propel the  wheelchair in the home or has a caregiver who can provide assistance.    Recommendations for follow up therapy are one component of a multi-disciplinary discharge planning process, led by the attending physician.  Recommendations may be updated based on patient status, additional functional criteria and insurance authorization.  Follow Up Recommendations  Skilled nursing-short term rehab (<3 hours/day)     Assistance Recommended at Discharge Intermittent Supervision/Assistance  Patient can return home with the following A lot of help with walking and/or transfers;A lot of help with bathing/dressing/bathroom;Help with stairs or ramp for entrance;Assist for transportation;Assistance with cooking/housework   Equipment Recommendations  Rolling walker (2 wheels);BSC/3in1;Wheelchair (measurements PT);Wheelchair cushion (measurements PT)    Recommendations for Other Services       Precautions / Restrictions Precautions Precautions: Knee;Back;Fall Required Braces or Orthoses: Spinal Brace Spinal Brace: Lumbar corset Restrictions Weight Bearing Restrictions: No Other Position/Activity Restrictions: refused lumbar corset when offered     Mobility  Bed Mobility               General bed mobility comments: up with tech upon arrival with walker. Patient Response: Cooperative  Transfers Overall transfer level: Needs assistance Equipment used: Rolling walker (2 wheels) Transfers: Sit to/from Stand Sit to Stand: Min assist   Step pivot transfers: Min assist       General transfer comment: better wiht RW today, able to pivot to [facility] then to sink with RW and sink support.    Ambulation/Gait Ambulation/Gait assistance: Min assist Gait  Distance (Feet): 2 Feet Assistive device: Rolling walker (2 wheels) Gait Pattern/deviations: Step-to pattern Gait velocity: decreased     General Gait Details: generally more steady today, continues to limit WB RLE but overall improved mobility.   Gait remains limited however to transfers.   Stairs             Wheelchair Mobility    Modified Rankin (Stroke Patients Only)       Balance Overall balance assessment: Needs assistance Sitting-balance support: Feet supported Sitting balance-Leahy Scale: Normal     Standing balance support: Bilateral upper extremity supported, During functional activity, Reliant on assistive device for balance Standing balance-Leahy Scale: Fair Standing balance comment: heavy reliance on walker for support                            Cognition Arousal/Alertness: Awake/alert Behavior During Therapy: WFL for tasks assessed/performed Overall Cognitive Status: Within Functional Limits for tasks assessed                                 General Comments: puts more effort into session today "I am trying"        Exercises Other Exercises Other Exercises: bathing at sink with emphasis at independant self care and standing for tasks when appropriate.  she is able to support herself at sink for some lower body bathing but does still need help for posterior care.    General Comments        Pertinent Vitals/Pain Pain Assessment Pain Assessment: Faces Faces Pain Scale: Hurts even more Pain Location: back, LE Pain Descriptors / Indicators: Aching, Guarding, Grimacing Pain Intervention(s): Limited activity within patient's tolerance, Monitored during session, Repositioned    Home Living                          Prior Function            PT Goals (current goals can now be found in the care plan section) Progress towards PT goals: Progressing toward goals    Frequency    7X/week      PT Plan Current plan remains appropriate    Co-evaluation              AM-PAC PT "6 Clicks" Mobility   Outcome Measure  Help needed turning from your back to your side while in a flat bed without using bedrails?: A Little Help needed moving from lying  on your back to sitting on the side of a flat bed without using bedrails?: A Little Help needed moving to and from a bed to a chair (including a wheelchair)?: A Little Help needed standing up from a chair using your arms (e.g., wheelchair or bedside chair)?: A Little Help needed to walk in hospital room?: A Lot Help needed climbing 3-5 steps with a railing? : Total 6 Click Score: 15    End of Session Equipment Utilized During Treatment: Gait belt Activity Tolerance: Patient tolerated treatment well Patient left: Other (comment) (on BSC with OT to resume care) Nurse Communication: Mobility status PT Visit Diagnosis: Other abnormalities of gait and mobility (R26.89);History of falling (Z91.81);Difficulty in walking, not elsewhere classified (R26.2);Pain Pain - Right/Left: Right Pain - part of body: Knee;Leg     Time: [id number] PT Time Calculation (min) (ACUTE ONLY): 25 min  Charges:  $Therapeutic Activity: 23-37 mins  Chesley Noon, PTA 08/18/22, 2:38 PM

## 2022-08-18 NOTE — Plan of Care (Signed)

## 2022-08-18 NOTE — Progress Notes (Signed)
Occupational Therapy Treatment Patient Details Name: Brittany Cobb MRN: [id number] DOB: [birth year] Today's Date: 08/18/2022   History of present illness Brittany Cobb is a 52 year old female with history of depression, anxiety, hypertension, GERD, morbid obesity, history of urinary retention, with no history of asthma, who presents emergency department for chief concerns of new acute low back pain. Patient slipped and fell on her bottom and has had difficulty ambulating since.  Found to have L1 compression fracture.   OT comments  Ms. Mitri was able to perform grooming in sitting with Mod I, transfers with RW w/ Min A, Max A for LB dressing, Max A for bed mobility -- pt unable to elevate LE to bed level during sit<supine. Pt able to ambulate only very short distances at present, w/ fair standing balance. If she discharges home, she would need assistance to get into house, even though she has a ramp, as unlikely at present she could cover that distance ambulating with RW. Pt reports bathroom is at end of her mobile home, ~ 20' from bedroom and living room -- again, a longer distance than pt is able to walk at present. Continue to recommend DC to SNF, although pt reports she will not consider this option. Pt endorses 7/10 pain in lower back/LE. She complains almost nonstop throughout session about all aspects of her care in the hospital.   Recommendations for follow up therapy are one component of a multi-disciplinary discharge planning process, led by the attending physician.  Recommendations may be updated based on patient status, additional functional criteria and insurance authorization.    Follow Up Recommendations  Skilled nursing-short term rehab (<3 hours/day)     Assistance Recommended at Discharge Intermittent Supervision/Assistance  Patient can return home with the following  A lot of help with walking and/or transfers;A lot of help with bathing/dressing/bathroom;Help with stairs or ramp  for entrance;Assistance with cooking/housework;Assist for transportation   Equipment Recommendations  BSC/3in1    Recommendations for Other Services      Precautions / Restrictions Precautions Precautions: Knee;Back;Fall Required Braces or Orthoses: Spinal Brace Spinal Brace: Lumbar corset Restrictions Weight Bearing Restrictions: No Other Position/Activity Restrictions: refused lumbar corset when offered       Mobility Bed Mobility Overal bed mobility: Needs Assistance Bed Mobility: Sit to Supine       Sit to supine: Max assist   General bed mobility comments: Max A for elevating b/l LE to bed level with sit<supine    Transfers Overall transfer level: Needs assistance Equipment used: Rolling walker (2 wheels) Transfers: Sit to/from Stand, Bed to chair/wheelchair/BSC Sit to Stand: Min assist     Step pivot transfers: Min assist           Balance Overall balance assessment: Needs assistance Sitting-balance support: Feet supported Sitting balance-Leahy Scale: Normal Sitting balance - Comments: pt c/o pain in sitting   Standing balance support: Bilateral upper extremity supported, During functional activity, Reliant on assistive device for balance Standing balance-Leahy Scale: Fair Standing balance comment: heavy reliance on walker for support                           ADL either performed or assessed with clinical judgement   ADL Overall ADL's : Needs assistance/impaired     Grooming: Oral care;Wash/dry hands;Sitting;Modified independent;Brushing hair Grooming Details (indicate cue type and reason): increased time, 2/2 inability to pay continued attention to task Upper Body Bathing: Supervision/ safety;Sitting   Lower Body Bathing:  Minimal assistance;Maximal assistance Lower Body Bathing Details (indicate cue type and reason): Max A for washing lower back in sitting; Min A for washing LB front     Lower Body Dressing: Maximal assistance        Toileting- Clothing Manipulation and Hygiene: Sit to/from stand;Moderate assistance              Extremity/Trunk Assessment Upper Extremity Assessment Upper Extremity Assessment: Overall WFL for tasks assessed   Lower Extremity Assessment Lower Extremity Assessment: Generalized weakness RLE Deficits / Details: painful right side due to fall. Unable to weight bear on right in standing RLE Coordination: decreased gross motor   Cervical / Trunk Assessment Cervical / Trunk Assessment: Normal    Vision       Perception     Praxis      Cognition Arousal/Alertness: Awake/alert Behavior During Therapy: WFL for tasks assessed/performed Overall Cognitive Status: Within Functional Limits for tasks assessed                                 General Comments: Pt appears to have difficult time understanding DC recs, deciding on a PoC        Exercises Other Exercises Other Exercises: Educ re: self-care, importance of OOB mobility, home modifications, DC options/recs    Shoulder Instructions       General Comments      Pertinent Vitals/ Pain       Pain Assessment Pain Assessment: 0-10 Pain Score: 7  Pain Location: back, LE Pain Descriptors / Indicators: Aching, Guarding, Grimacing Pain Intervention(s): Limited activity within patient's tolerance, Repositioned  Home Living                                          Prior Functioning/Environment              Frequency  Min 2X/week        Progress Toward Goals  OT Goals(current goals can now be found in the care plan section)  Progress towards OT goals: Progressing toward goals  Acute Rehab OT Goals OT Goal Formulation: With patient Time For Goal Achievement: 08/26/22 Potential to Achieve Goals: Good  Plan Discharge plan remains appropriate;Frequency remains appropriate    Co-evaluation                 AM-PAC OT "6 Clicks" Daily Activity     Outcome Measure    Help from another person eating meals?: None Help from another person taking care of personal grooming?: A Little Help from another person toileting, which includes using toliet, bedpan, or urinal?: A Lot Help from another person bathing (including washing, rinsing, drying)?: A Lot Help from another person to put on and taking off regular upper body clothing?: None Help from another person to put on and taking off regular lower body clothing?: A Lot 6 Click Score: 17    End of Session Equipment Utilized During Treatment: Rolling walker (2 wheels)  OT Visit Diagnosis: Other abnormalities of gait and mobility (R26.89);Muscle weakness (generalized) (M62.81)   Activity Tolerance Patient tolerated treatment well   Patient Left in bed;with call bell/phone within reach;with family/visitor present   Nurse Communication          Time: 1329-1400 OT Time Calculation (min): 31 min  Charges: OT General Charges $OT Visit: 1 Visit OT  Treatments $Self Care/Home Management : 23-37 mins Josiah Lobo, PhD, MS, OTR/L 08/18/22, 2:50 PM

## 2022-08-18 NOTE — Plan of Care (Signed)

## 2022-08-19 DIAGNOSIS — R52 Pain, unspecified: Secondary | ICD-10-CM | POA: Diagnosis not present

## 2022-08-19 LAB — CBC
HCT: 36.3 % (ref 36.0–46.0)
Hemoglobin: 11.7 g/dL — ABNORMAL LOW (ref 12.0–15.0)
MCH: 29.2 pg (ref 26.0–34.0)
MCHC: 32.2 g/dL (ref 30.0–36.0)
MCV: 90.5 fL (ref 80.0–100.0)
Platelets: 206 10*3/uL (ref 150–400)
RBC: 4.01 MIL/uL (ref 3.87–5.11)
RDW: 13.5 % (ref 11.5–15.5)
WBC: 6.8 10*3/uL (ref 4.0–10.5)
nRBC: 0 % (ref 0.0–0.2)

## 2022-08-19 LAB — BASIC METABOLIC PANEL
Anion gap: 7 (ref 5–15)
BUN: 17 mg/dL (ref 6–20)
CO2: 26 mmol/L (ref 22–32)
Calcium: 8.6 mg/dL — ABNORMAL LOW (ref 8.9–10.3)
Chloride: 106 mmol/L (ref 98–111)
Creatinine, Ser: 0.71 mg/dL (ref 0.44–1.00)
GFR, Estimated: >60 mL/min (ref >60)
Glucose, Bld: 105 mg/dL — ABNORMAL HIGH (ref 70–99)
Potassium: 3.8 mmol/L (ref 3.5–5.1)
Sodium: 139 mmol/L (ref 135–145)

## 2022-08-19 LAB — PROTIME-INR
INR: 1 (ref 0.8–1.2)
Prothrombin Time: 13 seconds (ref 11.4–15.2)

## 2022-08-19 LAB — MAGNESIUM: Magnesium: 2.3 mg/dL (ref 1.7–2.4)

## 2022-08-19 LAB — PHOSPHORUS: Phosphorus: 4.1 mg/dL (ref 2.5–4.6)

## 2022-08-19 MED ORDER — FUROSEMIDE 40 MG PO TABS
40.0000 mg | ORAL_TABLET | Freq: Two times a day (BID) | ORAL | Status: DC
  Administered 2022-08-19 – 2022-08-20 (×2): 40 mg via ORAL
  Filled 2022-08-19 (×2): qty 1

## 2022-08-19 MED ORDER — CEFAZOLIN SODIUM-DEXTROSE 2-4 GM/100ML-% IV SOLN
2.0000 g | INTRAVENOUS | Status: AC
  Filled 2022-08-19: qty 100

## 2022-08-19 MED ORDER — METOLAZONE 5 MG PO TABS
5.0000 mg | ORAL_TABLET | Freq: Once | ORAL | Status: AC
  Administered 2022-08-19: 5 mg via ORAL
  Filled 2022-08-19: qty 1

## 2022-08-19 MED ORDER — SODIUM CHLORIDE 0.9 % IV SOLN: INTRAVENOUS | Status: AC

## 2022-08-19 NOTE — Progress Notes (Signed)
[facility] Progress Note  Patient: Brittany Cobb    [id number]  DOA: 08/11/2022     Date of Service: the patient was seen and examined on 08/19/2022  Chief Complaint  Patient presents with   Back Pain   Brief hospital course: 52 year old with past medical history significant for depression, anxiety, hypertension, GERD, morbid obesity, history of urinary retention, who presents to the ED complaining of acute low back pain.  She slipped and fell onto her back  and had difficulty ambulating since.     She was found to have Acute appearing superior endplate compression deformity at L1 with approximately 40% height loss. No evidence of retropulsion. No evidence of an epidural hematoma.  Moderate spinal canal stenosis at L4-L5 secondary to combination of a small disc bulge and ligamentum flavum hypertrophy. Moderate right-sided neural foraminal stenosis at L4-L5.  Assessment and Plan: Principal Problem:   Inadequate pain control Active Problems:   Obesity, Class III, BMI 40-49.9 (morbid obesity) (HCC)   Essential hypertension   GERD (gastroesophageal reflux disease)   Anxiety   Grieving   Compression fracture of L1 lumbar vertebra, closed, initial encounter ([place])     # Back pain: L1 Compression Fracture:  CT lumbar spine: Acute appearing superior endplate compression deformity at L1 with approximately 40% height loss. No evidence of retropulsion. No evidence of an epidural hematoma.  Moderate spinal canal stenosis at L4-L5 secondary to combination of a small disc bulge and ligamentum flavum hypertrophy. Moderate right-sided neural foraminal stenosis at L4-L5. -neurosurgery recommend pain management and LSO brace.  -Pain not controlled, continue with morphine -lidocaine Patch.  -Received  Toradol IV for two days.  -Continue with oxycodone. Neurosurgery increase dose 2/23. -PT /OT consulted, recommend SNF< patient unable to afford SNF> and would like to go home.  -still having  pain, unable to put weight right leg due to pain.  -Repeated CT spine fracture stable.  Started on Gabapentin and Zanaflex 2/23 by neurosurgery.  Decline Kyphoplasty.  Unable to ambulate due to pain  Continue Ibuprofen to Q 8 hours for 2 days. monitor renal function.  2/25 started calcitonin nasal spray 2/26 IR consulted for, present, patient agreed for the procedure today.  As per IR patient may need to stay for few days as they will need insurance authorization.  Otherwise it can be done as an outpatient.   # Cellulitis, patient has mild arrhythmia right antiviral.  After IV insertion S/p Augmentin twice daily for 3 days   # Anxiety: Continue with trazodone, Venlafaxine  and lorazepam as needed   # GERD: Continue with PPI. # Essential hypertension: Continue with amlodipine and furosemide.  As needed hydralazine 2/28 c/o worsening of lower extremity edema, increase Lasix from 20 to 40 mg p.o. twice daily and metolazone 5 mg one-time dose given.   # Obesity class III BMI 49: Need lifestyle modification # Constipation: Continue  Miralax, senna. Dulcolax supp   # Bilateral Knee pain: right Knee CT negative for fracture, osteoarthritis.  Voltaren gel ordered.  Plan to get X ray left knee.    # Grieving - Her mother passed in November 2023 from fistula, sepsis  # Vitamin D level 30 at lower end so started vitamin D 50,000 units p.o. weekly, follow with PCP to repeat vitamin D level after 3 to 6 months. # Vitamin B12 level 351, goal >400, started vitamin B12 1000 mcg p.o. daily.   # Morbid obesity, Body mass index is 49.42 kg/m.  Interventions: Calorie restricted  diet and daily exercise advised to lose body weight after recovery      Diet: Regular diet DVT Prophylaxis: Subcutaneous Lovenox   Advance goals of care discussion: Full code  Family Communication: family was present at bedside, at the time of interview.  The pt provided permission to discuss medical plan with the  family. Opportunity was given to ask question and all questions were answered satisfactorily.   Disposition:  Pt is from Home, admitted with fall and L1 fracture, still has significant pain, unable to ambulate, IR consulted for kyphoplasty, which precludes a safe discharge. Discharge to TBD, when clinically stable.  Subjective: No significant events overnight, patient is having severe back pain [place], patient did not move bowels, stated that it hurts more during BM.  Patient was having some radiculopathy in the left lower extremity due to putting more pressure on the left side.  Patient was also complaining of edema in the bilateral lower extremities. Patient denies any chest pain, no respiratory symptoms.  Physical Exam: General: NAD, lying comfortably Appear in no distress, affect appropriate Eyes: PERRLA ENT: Oral Mucosa Clear, moist  Neck: no JVD,  Cardiovascular: S1 and S2 Present, no Murmur,  Respiratory: good respiratory effort, Bilateral Air entry equal and Decreased, no Crackles, no wheezes Abdomen: Bowel Sound present, Soft and no tenderness,  Skin: no rashes Extremities: mild Pedal edema, no calf tenderness Neurologic: without any new focal findings Gait not checked due to patient safety concerns  Vitals:   08/18/22 0727 08/18/22 1632 08/19/22 0136 08/19/22 0937  BP: (!) 140/64 (!) 150/84 (!) 112/58 (!) 151/59  Pulse: 73 79 75 67  Resp: '17 17 18 18  '$ Temp: 98.3 F (36.8 C) 98.4 F (36.9 C)  98.1 F (36.7 C)  TempSrc:      SpO2: 96% 100% 93% 98%  Weight:      Height:       No intake or output data in the 24 hours ending 08/19/22 1627  Filed Weights   08/11/22 1218  Weight: (!) 147.4 kg    Data Reviewed: I have personally reviewed and interpreted daily labs, tele strips, imagings as discussed above. I reviewed all nursing notes, pharmacy notes, vitals, pertinent old records I have discussed plan of care as described above with RN and  patient/family.  CBC: Recent Labs  Lab 08/17/22 0322 08/18/22 0302 08/19/22 0356  WBC 6.0 5.8 6.8  HGB 11.8* 11.5* 11.7*  HCT 36.3 35.9* 36.3  MCV 89.4 90.9 90.5  PLT 230 226 [id number]   Basic Metabolic Panel: Recent Labs  Lab 08/16/22 0504 08/17/22 0322 08/18/22 0302 08/19/22 0356  NA 139 138 139 139  K 3.4* 3.4* 3.6 3.8  CL 105 104 104 106  CO2 '28 27 29 26  '$ GLUCOSE 107* 104* 110* 105*  BUN '16 17 18 17  '$ CREATININE 0.70 0.69 0.88 0.71  CALCIUM 8.5* 8.7* 8.5* 8.6*  MG 1.9 2.1 2.3 2.3  PHOS 3.8 4.6 4.0 4.1    Studies: No results found.  Scheduled Meds:  acetaminophen  650 mg Oral Q6H   amLODipine  5 mg Oral Daily   calcitonin (salmon)  1 spray Alternating Nares Daily   vitamin B-12  1,000 mcg Oral Daily   diclofenac Sodium  2 g Topical QID   enoxaparin (LOVENOX) injection  0.5 mg/kg Subcutaneous Q24H   furosemide  20 mg Oral BID   gabapentin  300 mg Oral TID   lamoTRIgine  200 mg Oral QHS   mometasone-formoterol  2  puff Inhalation BID   nystatin   Topical BID   pantoprazole  40 mg Oral BID   polyethylene glycol  17 g Oral BID   senna-docusate  1 tablet Oral BID   topiramate  50 mg Oral QHS   traZODone  50 mg Oral QHS   valACYclovir  1,000 mg Oral Daily   venlafaxine XR  150 mg Oral Daily   Vitamin D (Ergocalciferol)  50,000 Units Oral Q7 days   Continuous Infusions:  [START ON 08/20/2022] sodium chloride     [START ON 08/20/2022]  ceFAZolin (ANCEF) IV     PRN Meds: albuterol, bisacodyl, busPIRone, LORazepam, mouth rinse, oxyCODONE, oxyCODONE, tiZANidine  Time spent: 50 minutes  Author: Val Riles. MD [facility] 08/19/2022 4:27 PM  To reach On-call, see care teams to locate the attending and reach out to them via [email or web address]. If 7PM-7AM, please contact night-coverage If you still have difficulty reaching the attending provider, please page the [facility] (Director on Call) for [facility] on amion for assistance.

## 2022-08-19 NOTE — Plan of Care (Signed)

## 2022-08-19 NOTE — Progress Notes (Signed)
Physical Therapy Treatment Patient Details Name: Brittany Cobb MRN: [id number] DOB: [birth year] Today's Date: 08/19/2022   History of Present Illness [name] is a 52 year old female with history of depression, anxiety, hypertension, GERD, morbid obesity, history of urinary retention, with no history of asthma, who presents emergency department for chief concerns of new acute low back pain. Patient slipped and fell on her bottom and has had difficulty ambulating since.  Found to have L1 compression fracture.    PT Comments    Pt c/c of back pain radiating down R LE. She is limited to transfers out of bed at this time with only a few shuffling steps with RW support due to pain and weakness. Currently awaiting IR procedure tomorrow, ?Kyphoplasty. Pt states she would rather d/c home from hospital once able and not SNF. Pt has a ramp at home, RW, and BSC. May need w/c with cushion, however will reassess functional mobility and make new recommendations after procedure.    Recommendations for follow up therapy are one component of a multi-disciplinary discharge planning process, led by the attending physician.  Recommendations may be updated based on patient status, additional functional criteria and insurance authorization.  Follow Up Recommendations  Skilled nursing-short term rehab (<3 hours/day) Can patient physically be transported by private vehicle: No   Assistance Recommended at Discharge Intermittent Supervision/Assistance  Patient can return home with the following A lot of help with walking and/or transfers;A lot of help with bathing/dressing/bathroom;Help with stairs or ramp for entrance;Assist for transportation;Assistance with cooking/housework   Equipment Recommendations   (Pt has RW, BSC, and ramp at home, may benefit from w/c and cushion)    Recommendations for Other Services       Precautions / Restrictions Precautions Precautions: Knee;Back;Fall Precaution Booklet  Issued: No Precaution Comments:  (No twisting/turning at trunk) Required Braces or Orthoses: Spinal Brace Spinal Brace: Lumbar corset Restrictions Weight Bearing Restrictions: No Other Position/Activity Restrictions: declined lumbar corset for bed to [facility]     Mobility  Bed Mobility Overal bed mobility: Needs Assistance Bed Mobility: Sit to Supine Rolling: Modified independent (Device/Increase time) (Increased time with use of side rails)   Supine to sit: Min guard, HOB elevated, Supervision (use of side rail)     General bed mobility comments:  (Pt assisted out of bed to bedside commode with increased time for pain control)    Transfers Overall transfer level: Needs assistance Equipment used: Rolling walker (2 wheels) Transfers: Sit to/from Stand, Bed to chair/wheelchair/BSC Sit to Stand: Mod assist, From elevated surface Stand pivot transfers: Min assist Step pivot transfers: Min assist (Use of RW and bed rail to side step to bedside commodde)       General transfer comment: Upper body is strong and able to compensate for LE weakness and radiating pain    Ambulation/Gait               General Gait Details: Unable to tolerate this session due to increased pain with sitting and standing   Stairs             Wheelchair Mobility    Modified Rankin (Stroke Patients Only)       Balance Overall balance assessment: Needs assistance Sitting-balance support: Feet supported Sitting balance-Leahy Scale: Normal Sitting balance - Comments: pt c/o pain in sitting   Standing balance support: Bilateral upper extremity supported, During functional activity, Reliant on assistive device for balance Standing balance-Leahy Scale: Fair Standing balance comment: heavy reliance on walker for support (  no knee buckling noted with short distance)                            Cognition Arousal/Alertness: Awake/alert Behavior During Therapy: WFL for tasks  assessed/performed Overall Cognitive Status: Within Functional Limits for tasks assessed                                 General Comments:  (Pleasant once needs understood. Cooperative, motivated to improve)        Exercises      General Comments General comments (skin integrity, edema, etc.): Discussed POC with pt who does not want to go to SNF upon d/c after IR procedure tomorrow. Pt educated on safe d/c and possible benefits of w/c if needed. Pt has a ramp at home      Pertinent Vitals/Pain Pain Assessment Pain Assessment: 0-10 Pain Score: 10-Worst pain ever Pain Location: back, LE Pain Descriptors / Indicators: Aching, Guarding, Grimacing Pain Intervention(s): Limited activity within patient's tolerance    Home Living                          Prior Function            PT Goals (current goals can now be found in the care plan section) Acute Rehab PT Goals Patient Stated Goal: to return home    Frequency    7X/week      PT Plan Discharge plan needs to be updated    Co-evaluation              AM-PAC PT "6 Clicks" Mobility   Outcome Measure  Help needed turning from your back to your side while in a flat bed without using bedrails?: A Little Help needed moving from lying on your back to sitting on the side of a flat bed without using bedrails?: A Little Help needed moving to and from a bed to a chair (including a wheelchair)?: A Little Help needed standing up from a chair using your arms (e.g., wheelchair or bedside chair)?: A Little Help needed to walk in hospital room?: A Lot Help needed climbing 3-5 steps with a railing? : Total 6 Click Score: 15    End of Session   Activity Tolerance: Patient tolerated treatment well Patient left: Other (comment) (On BSC with call bell upon request) Nurse Communication: Mobility status (Pt on BSC) PT Visit Diagnosis: Other abnormalities of gait and mobility (R26.89);History of falling  (Z91.81);Difficulty in walking, not elsewhere classified (R26.2);Pain Pain - Right/Left: Right Pain - part of body: Knee;Leg     Time: [id number] PT Time Calculation (min) (ACUTE ONLY): 14 min  Charges:  $Therapeutic Activity: 8-22 mins                    Mikel Cella, PTA   Brittany Cobb 08/19/2022, 2:32 PM

## 2022-08-20 ENCOUNTER — Inpatient Hospital Stay: Payer: Medicaid Other

## 2022-08-20 ENCOUNTER — Inpatient Hospital Stay: Payer: Medicaid Other | Admitting: Radiology

## 2022-08-20 HISTORY — PX: IR KYPHO LUMBAR INC FX REDUCE BONE BX UNI/BIL CANNULATION INC/IMAGING: IMG5519

## 2022-08-20 LAB — BASIC METABOLIC PANEL
Anion gap: 9 (ref 5–15)
BUN: 17 mg/dL (ref 6–20)
CO2: 31 mmol/L (ref 22–32)
Calcium: 9 mg/dL (ref 8.9–10.3)
Chloride: 99 mmol/L (ref 98–111)
Creatinine, Ser: 0.75 mg/dL (ref 0.44–1.00)
GFR, Estimated: >60 mL/min (ref >60)
Glucose, Bld: 110 mg/dL — ABNORMAL HIGH (ref 70–99)
Potassium: 3.5 mmol/L (ref 3.5–5.1)
Sodium: 139 mmol/L (ref 135–145)

## 2022-08-20 LAB — CBC
HCT: 41.1 % (ref 36.0–46.0)
Hemoglobin: 13.3 g/dL (ref 12.0–15.0)
MCH: 29.3 pg (ref 26.0–34.0)
MCHC: 32.4 g/dL (ref 30.0–36.0)
MCV: 90.5 fL (ref 80.0–100.0)
Platelets: 243 10*3/uL (ref 150–400)
RBC: 4.54 MIL/uL (ref 3.87–5.11)
RDW: 13.3 % (ref 11.5–15.5)
WBC: 5.6 10*3/uL (ref 4.0–10.5)
nRBC: 0 % (ref 0.0–0.2)

## 2022-08-20 LAB — SURGICAL PCR SCREEN
MRSA, PCR: NEGATIVE
Staphylococcus aureus: NEGATIVE

## 2022-08-20 MED ORDER — FENTANYL CITRATE (PF) 100 MCG/2ML IJ SOLN
INTRAMUSCULAR | Status: AC
  Filled 2022-08-20: qty 2

## 2022-08-20 MED ORDER — FUROSEMIDE 10 MG/ML IJ SOLN
40.0000 mg | Freq: Two times a day (BID) | INTRAMUSCULAR | Status: DC
  Administered 2022-08-20 – 2022-08-25 (×10): 40 mg via INTRAVENOUS
  Filled 2022-08-20 (×10): qty 4

## 2022-08-20 MED ORDER — FENTANYL CITRATE (PF) 100 MCG/2ML IJ SOLN
INTRAMUSCULAR | Status: AC | PRN
  Administered 2022-08-20: 25 ug via INTRAVENOUS
  Administered 2022-08-20: 50 ug via INTRAVENOUS
  Administered 2022-08-20: 25 ug via INTRAVENOUS
  Administered 2022-08-20: 50 ug via INTRAVENOUS

## 2022-08-20 MED ORDER — MIDAZOLAM HCL 2 MG/2ML IJ SOLN
INTRAMUSCULAR | Status: AC
  Filled 2022-08-20: qty 2

## 2022-08-20 MED ORDER — MIDAZOLAM HCL 2 MG/2ML IJ SOLN
INTRAMUSCULAR | Status: AC | PRN
  Administered 2022-08-20: 1 mg via INTRAVENOUS

## 2022-08-20 MED ORDER — MIDAZOLAM HCL 5 MG/5ML IJ SOLN
INTRAMUSCULAR | Status: AC | PRN
  Administered 2022-08-20: .5 mg via INTRAVENOUS
  Administered 2022-08-20: 1 mg via INTRAVENOUS
  Administered 2022-08-20: .5 mg via INTRAVENOUS

## 2022-08-20 MED ORDER — HYDROMORPHONE HCL 1 MG/ML IJ SOLN
0.5000 mg | INTRAMUSCULAR | Status: DC | PRN
  Administered 2022-08-20 – 2022-08-22 (×5): 0.5 mg via INTRAVENOUS
  Filled 2022-08-20 (×5): qty 1

## 2022-08-20 MED ORDER — ONDANSETRON HCL 4 MG PO TABS
4.0000 mg | ORAL_TABLET | Freq: Three times a day (TID) | ORAL | Status: DC | PRN
  Administered 2022-08-20: 4 mg via ORAL
  Filled 2022-08-20: qty 1

## 2022-08-20 MED ORDER — DIPHENHYDRAMINE HCL 50 MG/ML IJ SOLN
INTRAMUSCULAR | Status: AC
  Filled 2022-08-20: qty 1

## 2022-08-20 MED ORDER — CEFAZOLIN SODIUM-DEXTROSE 2-4 GM/100ML-% IV SOLN
INTRAVENOUS | Status: AC
  Filled 2022-08-20: qty 100

## 2022-08-20 MED ORDER — CEFAZOLIN SODIUM-DEXTROSE 2-4 GM/100ML-% IV SOLN
INTRAVENOUS | Status: AC | PRN
  Administered 2022-08-20: 2 g via INTRAVENOUS

## 2022-08-20 MED ORDER — LIDOCAINE HCL (PF) 1 % IJ SOLN
INTRAMUSCULAR | Status: AC
  Administered 2022-08-20: 20 mL
  Filled 2022-08-20: qty 30

## 2022-08-20 MED ORDER — HYDROMORPHONE HCL 1 MG/ML IJ SOLN
1.0000 mg | Freq: Once | INTRAMUSCULAR | Status: AC
  Administered 2022-08-20: 1 mg via INTRAVENOUS
  Filled 2022-08-20: qty 1

## 2022-08-20 NOTE — Progress Notes (Signed)
2/27: Insurance -Utilization Review Inpatient Auth Mgr - Adline Peals [phone] [facility]) checked insurance for prior authorization. Magda Paganini stated that this procedure is covered under her inpatient stay and does NOT need prior authorization.

## 2022-08-20 NOTE — TOC Progression Note (Signed)
Transition of Care [facility]) - Progression Note    Patient Details  Name: Brittany Cobb MRN: [id number] Date of Birth: Jul 05, 1970  [facility] CM/SW [place], RN Phone Number: 08/20/2022, 1:52 PM  Clinical Narrative:    The patient had Kypho completed, awaiting PT eval, TOC continues to follow for needs   Expected Discharge Plan: OP Rehab Barriers to Discharge: Inadequate or no insurance  Expected Discharge Plan and Services   Discharge Planning Services: CM Consult Post Acute Care Choice: [place] arrangements for the past 2 months: Mobile Home                 DME Arranged: Walker rolling, 3-N-1 DME Agency: [employer] Date DME Agency Contacted: 08/13/22 Time DME Agency Contacted: [phone] Representative spoke with at DME Agency: [place]: NA [place]: NA Date [place]: 08/13/22 Time [place]: [id number] Representative spoke with at [place]: [place] Determinants of Health ([place]) Interventions SDOH Screenings   Food Insecurity: No Food Insecurity (08/12/2022)  Housing: Low Risk  (08/12/2022)  Transportation Needs: No Transportation Needs (08/13/2022)  Utilities: Not At Risk (08/12/2022)  Tobacco Use: High Risk (08/20/2022)    Readmission Risk Interventions     No data to display

## 2022-08-20 NOTE — Procedures (Signed)
Interventional Radiology Procedure Note  Date of Procedure: 08/20/2022  Procedure: L1 KP   Findings:  1. Successful L1 KP    Complications: No immediate complications noted.   Estimated Blood Loss: minimal  Follow-up and Recommendations: 1. Bedrest 1 hour    Albin Felling, MD  Vascular & Interventional Radiology  08/20/2022 12:11 PM

## 2022-08-20 NOTE — Progress Notes (Signed)
Patient clinically stable post L1 kyphoplasty per Dr Denna Haggard, tolerated well. Received Versed 3 mg along with Fentanyl 150 mcg IV for procedure. Awakens easily to verbal stimuli. Alert and oriented post procedure. Report given to Genelle Bal Rn post procedure//329.

## 2022-08-20 NOTE — Progress Notes (Signed)
[facility] Progress Note  Patient: Brittany Cobb    [id number]  DOA: 08/11/2022     Date of Service: the patient was seen and examined on 08/20/2022  Chief Complaint  Patient presents with   Back Pain   Brief hospital course: 52 year old with past medical history significant for depression, anxiety, hypertension, GERD, morbid obesity, history of urinary retention, who presents to the ED complaining of acute low back pain.  She slipped and fell onto her back  and had difficulty ambulating since.     She was found to have Acute appearing superior endplate compression deformity at L1 with approximately 40% height loss. No evidence of retropulsion. No evidence of an epidural hematoma.  Moderate spinal canal stenosis at L4-L5 secondary to combination of a small disc bulge and ligamentum flavum hypertrophy. Moderate right-sided neural foraminal stenosis at L4-L5.  Assessment and Plan: Principal Problem:   Inadequate pain control Active Problems:   Obesity, Class III, BMI 40-49.9 (morbid obesity) (HCC)   Essential hypertension   GERD (gastroesophageal reflux disease)   Anxiety   Grieving   Compression fracture of L1 lumbar vertebra, closed, initial encounter ([place])     # Back pain: L1 Compression Fracture:  CT lumbar spine: Acute appearing superior endplate compression deformity at L1 with approximately 40% height loss. No evidence of retropulsion. No evidence of an epidural hematoma.  Moderate spinal canal stenosis at L4-L5 secondary to combination of a small disc bulge and ligamentum flavum hypertrophy. Moderate right-sided neural foraminal stenosis at L4-L5. -neurosurgery recommend pain management and LSO brace.  -lidocaine Patch.  -Received  Toradol IV for two days. S/p Ibuprofen to Q 8 hours for 2 days. -Continue with oxycodone. Neurosurgery increase dose 2/23. -PT /OT consulted, recommend SNF< patient unable to afford SNF> and would like to go home.  -Repeated CT spine  fracture stable.  Started on Gabapentin and Zanaflex 2/23 by neurosurgery.  2/25 started calcitonin nasal spray 2/29 s/p kyphoplasty done by IR  Continue Tylenol 650 every 6 hourly scheduled, oxycodone oral as needed and Dilaudid IV as needed for breakthrough pain    # Cellulitis, patient has mild arrhythmia right antiviral.  After IV insertion S/p Augmentin twice daily for 3 days   # Anxiety: Continue with trazodone, Venlafaxine  and lorazepam as needed   # GERD: Continue with PPI. # Essential hypertension: Continue with amlodipine and furosemide.  As needed hydralazine 2/28 c/o worsening of lower extremity edema, increase Lasix from 20 to 40 mg p.o. twice daily and metolazone 5 mg one-time dose given. 2/29 changed Lasix 40 mg IV twice daily   # Obesity class III BMI 49: Need lifestyle modification # Constipation: Continue  Miralax, senna. Dulcolax supp   # Bilateral Knee pain: right Knee CT negative for fracture, osteoarthritis.  Voltaren gel ordered.  Plan to get X ray left knee.    # Grieving - Her mother passed in November 2023 from fistula, sepsis  # Vitamin D level 30 at lower end so started vitamin D 50,000 units p.o. weekly, follow with PCP to repeat vitamin D level after 3 to 6 months. # Vitamin B12 level 351, goal >400, started vitamin B12 1000 mcg p.o. daily.   # Morbid obesity, Body mass index is 49.42 kg/m.  Interventions: Calorie restricted diet and daily exercise advised to lose body weight after recovery      Diet: Regular diet DVT Prophylaxis: Subcutaneous Lovenox   Advance goals of care discussion: Full code  Family Communication: family was  present at bedside, at the time of interview.  The pt provided permission to discuss medical plan with the family. Opportunity was given to ask question and all questions were answered satisfactorily.   Disposition:  Pt is from Home, admitted with fall and L1 fracture, still has significant pain, unable to  ambulate, s/p kyphoplasty done on 2/29, which precludes a safe discharge. Discharge to TBD, when clinically stable.  Subjective: No significant events overnight, patient was seen after kyphoplasty  patient had significant pain 13/10 after the procedure. Patient feels improvement in the lower extremity edema.  Patient stated that she could not tolerate venous duplex due to severe pain in the lower legs so it was not done.   Physical Exam: General: NAD, lying comfortably Appear in no distress, affect appropriate Eyes: PERRLA ENT: Oral Mucosa Clear, moist  Neck: no JVD,  Cardiovascular: S1 and S2 Present, no Murmur,  Respiratory: good respiratory effort, Bilateral Air entry equal and Decreased, no Crackles, no wheezes Abdomen: Bowel Sound present, Soft and no tenderness,  Skin: no rashes Extremities: mild Pedal edema, no calf tenderness Neurologic: without any new focal findings Gait not checked due to patient safety concerns  Vitals:   08/20/22 1245 08/20/22 1300 08/20/22 1310 08/20/22 1500  BP: 100/61 (!) 106/59 108/69 (!) 119/41  Pulse: 66 69 78 71  Resp: '11 11  15  '$ Temp:    98.1 F (36.7 C)  TempSrc:    Oral  SpO2: (!) 89% 94% 92% 94%  Weight:      Height:        Intake/Output Summary (Last 24 hours) at 08/20/2022 1637 Last data filed at 08/20/2022 1521 Gross per 24 hour  Intake 52.83 ml  Output 3850 ml  Net -3797.17 ml    Filed Weights   08/11/22 1218 08/20/22 0929  Weight: (!) 147.4 kg (!) 147.4 kg    Data Reviewed: I have personally reviewed and interpreted daily labs, tele strips, imagings as discussed above. I reviewed all nursing notes, pharmacy notes, vitals, pertinent old records I have discussed plan of care as described above with RN and patient/family.  CBC: Recent Labs  Lab 08/17/22 0322 08/18/22 0302 08/19/22 0356 08/20/22 0728  WBC 6.0 5.8 6.8 5.6  HGB 11.8* 11.5* 11.7* 13.3  HCT 36.3 35.9* 36.3 41.1  MCV 89.4 90.9 90.5 90.5  PLT 230 226  206 0000000   Basic Metabolic Panel: Recent Labs  Lab 08/16/22 0504 08/17/22 0322 08/18/22 0302 08/19/22 0356 08/20/22 0728  NA 139 138 139 139 139  K 3.4* 3.4* 3.6 3.8 3.5  CL 105 104 104 106 99  CO2 '28 27 29 26 31  '$ GLUCOSE 107* 104* 110* 105* 110*  BUN '16 17 18 17 17  '$ CREATININE 0.70 0.69 0.88 0.71 0.75  CALCIUM 8.5* 8.7* 8.5* 8.6* 9.0  MG 1.9 2.1 2.3 2.3  --   PHOS 3.8 4.6 4.0 4.1  --     Studies: IR KYPHO LUMBAR INC FX REDUCE BONE BX UNI/BIL CANNULATION INC/IMAGING  Result Date: 08/20/2022 INDICATION: Acute to subacute L1 compression fracture with pain refractory to conservative measures EXAM: L1 vertebral body augmentation using balloon kyphoplasty COMPARISON:  None Available. MEDICATIONS: Documented in the EMR ANESTHESIA/SEDATION: Moderate (conscious) sedation was employed during this procedure. A total of Versed 3 mg and Fentanyl 150 mcg was administered intravenously by the radiology nurse. Total intra-service moderate Sedation Time: 42 minutes. The patient's level of consciousness and vital signs were monitored continuously by radiology nursing throughout the procedure  under my direct supervision. FLUOROSCOPY: Radiation Exposure Index (as provided by the fluoroscopic device): 5.8 minutes ([id number] mGy) COMPLICATIONS: None immediate. PROCEDURE: Informed written consent was obtained from the patient after a thorough discussion of the procedural risks, benefits and alternatives. All questions were addressed. Maximal Sterile Barrier Technique was utilized including caps, mask, sterile gowns, sterile gloves, sterile drape, hand hygiene and skin antiseptic. A timeout was performed prior to the initiation of the procedure. The patient was positioned prone on the exam table. A bipedicular access was planned. Skin entry site(s) were marked overlying the L1 vertebral body using fluoroscopy. The overlying skin was then prepped and draped in the standard sterile fashion. Local analgesia was obtained  with 1% lidocaine. Attention was first turned to the right side. Under fluoroscopic guidance, a 10 gauge introducer needle was advanced towards the lateral margin of the pedicle. Using multiple projections, the introducer needle was advanced towards the posterior margin of the vertebral body via a transpedicular approach. The inner needle was then removed, and the drill was advanced towards the anterior margin of the vertebral body. Attention was then turned to the contralateral side. Under fluoroscopic guidance, a 10 gauge introducer needle was advanced towards the lateral margin of the pedicle. Using multiple projections, the introducer needle was advanced towards the posterior margin of the vertebral body via a transpedicular approach. The inner needle was then removed, and the drill was advanced towards the anterior margin of the vertebral body. The Kyphon 15 mm inflatable bone tamps were then advanced through the bilateral transpedicular access needles and positioned within the mid vertebral body. Kyphoplasty was then performed, ensuring that the balloon contours stayed within the vertebral body margins. The balloons were then deflated and removed, followed by advancement of the bone filler devices bilaterally and the instillation of acrylic bone cement with excellent filling in the AP and lateral projections. No extravasation was noted in the disk spaces or posteriorly into the spinal canal. No epidural venous contamination was seen. At the end of the procedure, the introducer cannulas and bone filler devices were then removed without difficulty. Clean dressings were placed after hemostasis. The patient tolerated all aspects of the procedure well, and was transferred to recovery in stable condition. IMPRESSION: 1. Successful L1 vertebral body augmentation using balloon kyphoplasty. If the patient has known osteoporosis, recommend treatment as clinically indicated. If the patient's bone density status is  unknown, DEXA scan is recommended. Electronically Signed   By: Albin Felling M.D.   On: 08/20/2022 13:39    Scheduled Meds:  acetaminophen  650 mg Oral Q6H   amLODipine  5 mg Oral Daily   calcitonin (salmon)  1 spray Alternating Nares Daily   vitamin B-12  1,000 mcg Oral Daily   diclofenac Sodium  2 g Topical QID   fentaNYL       fentaNYL       furosemide  40 mg Oral BID   gabapentin  300 mg Oral TID   lamoTRIgine  200 mg Oral QHS   midazolam       midazolam       mometasone-formoterol  2 puff Inhalation BID   nystatin   Topical BID   pantoprazole  40 mg Oral BID   polyethylene glycol  17 g Oral BID   senna-docusate  1 tablet Oral BID   topiramate  50 mg Oral QHS   traZODone  50 mg Oral QHS   valACYclovir  1,000 mg Oral Daily   venlafaxine XR  150  mg Oral Daily   Vitamin D (Ergocalciferol)  50,000 Units Oral Q7 days   Continuous Infusions:  ceFAZolin      ceFAZolin (ANCEF) IV     PRN Meds: albuterol, bisacodyl, busPIRone, ceFAZolin, fentaNYL, fentaNYL, HYDROmorphone (DILAUDID) injection, LORazepam, midazolam, midazolam, mouth rinse, oxyCODONE, oxyCODONE, tiZANidine  Time spent: 35 minutes  Author: Val Riles. MD [facility] 08/20/2022 4:37 PM  To reach On-call, see care teams to locate the attending and reach out to them via [email or web address]. If 7PM-7AM, please contact night-coverage If you still have difficulty reaching the attending provider, please page the [facility] (Director on Call) for [facility] on amion for assistance.

## 2022-08-20 NOTE — Progress Notes (Signed)
Patient remains stable. Alert and oriented report called to [facility] RN post recovery/160, questions answered.

## 2022-08-20 NOTE — Plan of Care (Signed)
  Problem: Elimination: Goal: Will not experience complications related to bowel motility Outcome: Progressing   Problem: Pain Managment: Goal: General experience of comfort will improve Outcome: Progressing   Problem: Safety: Goal: Ability to remain free from injury will improve Outcome: Progressing   Problem: Nutrition: Goal: Adequate nutrition will be maintained Outcome: Progressing   Problem: Activity: Goal: Risk for activity intolerance will decrease Outcome: Progressing

## 2022-08-21 ENCOUNTER — Inpatient Hospital Stay: Payer: Medicaid Other

## 2022-08-21 LAB — BASIC METABOLIC PANEL
Anion gap: 12 (ref 5–15)
BUN: 18 mg/dL (ref 6–20)
CO2: 33 mmol/L — ABNORMAL HIGH (ref 22–32)
Calcium: 9.3 mg/dL (ref 8.9–10.3)
Chloride: 93 mmol/L — ABNORMAL LOW (ref 98–111)
Creatinine, Ser: 0.78 mg/dL (ref 0.44–1.00)
GFR, Estimated: >60 mL/min (ref >60)
Glucose, Bld: 107 mg/dL — ABNORMAL HIGH (ref 70–99)
Potassium: 3.2 mmol/L — ABNORMAL LOW (ref 3.5–5.1)
Sodium: 138 mmol/L (ref 135–145)

## 2022-08-21 LAB — CBC
HCT: 40 % (ref 36.0–46.0)
Hemoglobin: 13.1 g/dL (ref 12.0–15.0)
MCH: 29.1 pg (ref 26.0–34.0)
MCHC: 32.8 g/dL (ref 30.0–36.0)
MCV: 88.9 fL (ref 80.0–100.0)
Platelets: 265 10*3/uL (ref 150–400)
RBC: 4.5 MIL/uL (ref 3.87–5.11)
RDW: 13.2 % (ref 11.5–15.5)
WBC: 7.7 10*3/uL (ref 4.0–10.5)
nRBC: 0 % (ref 0.0–0.2)

## 2022-08-21 MED ORDER — POTASSIUM CHLORIDE CRYS ER 20 MEQ PO TBCR
40.0000 meq | EXTENDED_RELEASE_TABLET | ORAL | Status: AC
  Administered 2022-08-21 (×2): 40 meq via ORAL
  Filled 2022-08-21 (×2): qty 2

## 2022-08-21 MED ORDER — KETOROLAC TROMETHAMINE 15 MG/ML IJ SOLN
15.0000 mg | Freq: Three times a day (TID) | INTRAMUSCULAR | Status: AC
  Administered 2022-08-21 (×2): 15 mg via INTRAVENOUS
  Filled 2022-08-21 (×2): qty 1

## 2022-08-21 MED ORDER — ENOXAPARIN SODIUM 80 MG/0.8ML IJ SOSY
0.5000 mg/kg | PREFILLED_SYRINGE | INTRAMUSCULAR | Status: DC
  Administered 2022-08-21 – 2022-08-25 (×5): 72.5 mg via SUBCUTANEOUS
  Filled 2022-08-21 (×6): qty 0.72

## 2022-08-21 NOTE — Plan of Care (Signed)
  Problem: Elimination: Goal: Will not experience complications related to bowel motility Outcome: Progressing   Problem: Coping: Goal: Level of anxiety will decrease Outcome: Progressing   Problem: Nutrition: Goal: Adequate nutrition will be maintained Outcome: Progressing   Problem: Skin Integrity: Goal: Risk for impaired skin integrity will decrease Outcome: Progressing   Problem: Safety: Goal: Ability to remain free from injury will improve Outcome: Progressing   Problem: Pain Managment: Goal: General experience of comfort will improve Outcome: Progressing

## 2022-08-21 NOTE — Plan of Care (Signed)
  Problem: Health Behavior/Discharge Planning: Goal: Ability to manage health-related needs will improve Outcome: Progressing   Problem: Clinical Measurements: Goal: Ability to maintain clinical measurements within normal limits will improve Outcome: Progressing Goal: Diagnostic test results will improve Outcome: Progressing Goal: Cardiovascular complication will be avoided Outcome: Progressing   Problem: Activity: Goal: Risk for activity intolerance will decrease Outcome: Progressing   Problem: Nutrition: Goal: Adequate nutrition will be maintained Outcome: Progressing   Problem: Elimination: Goal: Will not experience complications related to bowel motility Outcome: Progressing   Problem: Pain Managment: Goal: General experience of comfort will improve Outcome: Progressing

## 2022-08-21 NOTE — Progress Notes (Signed)
[facility] Progress Note  Patient: Brittany Cobb    [id number]  DOA: 08/11/2022     Date of Service: the patient was seen and examined on 08/21/2022  Chief Complaint  Patient presents with   Back Pain   Brief hospital course: 52 year old with past medical history significant for depression, anxiety, hypertension, GERD, morbid obesity, history of urinary retention, who presents to the ED complaining of acute low back pain.  She slipped and fell onto her back  and had difficulty ambulating since.     She was found to have Acute appearing superior endplate compression deformity at L1 with approximately 40% height loss. No evidence of retropulsion. No evidence of an epidural hematoma.  Moderate spinal canal stenosis at L4-L5 secondary to combination of a small disc bulge and ligamentum flavum hypertrophy. Moderate right-sided neural foraminal stenosis at L4-L5.  Assessment and Plan: Principal Problem:   Inadequate pain control Active Problems:   Obesity, Class III, BMI 40-49.9 (morbid obesity) (HCC)   Essential hypertension   GERD (gastroesophageal reflux disease)   Anxiety   Grieving   Compression fracture of L1 lumbar vertebra, closed, initial encounter ([place])     # Back pain: L1 Compression Fracture:  CT lumbar spine: Acute appearing superior endplate compression deformity at L1 with approximately 40% height loss. No evidence of retropulsion. No evidence of an epidural hematoma.  Moderate spinal canal stenosis at L4-L5 secondary to combination of a small disc bulge and ligamentum flavum hypertrophy. Moderate right-sided neural foraminal stenosis at L4-L5. -neurosurgery recommend pain management and LSO brace.  -lidocaine Patch.  -Received  Toradol IV for two days. S/p Ibuprofen to Q 8 hours for 2 days. -Continue with oxycodone. Neurosurgery increase dose 2/23. -PT /OT consulted, recommend SNF< patient unable to afford SNF> and would like to go home.  -Repeated CT spine fracture  stable.  Started on Gabapentin and Zanaflex 2/23 by neurosurgery.  2/25 started calcitonin nasal spray 2/29 s/p kyphoplasty done by IR  Continue Tylenol 650 every 6 hourly scheduled, oxycodone oral as needed and Dilaudid IV as needed for breakthrough pain 3/1 Toradol 15 mg IV every 8 hourly x 2 doses ordered due to severe pain Lumbar x-ray did not show any acute findings   # Cellulitis, patient has mild arrhythmia right antiviral.  After IV insertion S/p Augmentin twice daily for 3 days   # Anxiety: Continue with trazodone, Venlafaxine  and lorazepam as needed   # GERD: Continue with PPI. # Essential hypertension: Continue with amlodipine and furosemide.  As needed hydralazine 2/28 c/o worsening of lower extremity edema, increase Lasix from 20 to 40 mg p.o. twice daily and metolazone 5 mg one-time dose given. 2/29 changed Lasix 40 mg IV twice daily   # Obesity class III BMI 49: Need lifestyle modification # Constipation: Continue  Miralax, senna. Dulcolax supp   # Bilateral Knee pain: right Knee CT negative for fracture, osteoarthritis.  Voltaren gel ordered.  Plan to get X ray left knee.    # Grieving - Her mother passed in November 2023 from fistula, sepsis  # Vitamin D level 30 at lower end so started vitamin D 50,000 units p.o. weekly, follow with PCP to repeat vitamin D level after 3 to 6 months. # Vitamin B12 level 351, goal >400, started vitamin B12 1000 mcg p.o. daily.   # Morbid obesity, Body mass index is 49.42 kg/m.  Interventions: Calorie restricted diet and daily exercise advised to lose body weight after recovery  Diet: Regular diet DVT Prophylaxis: Subcutaneous Lovenox   Advance goals of care discussion: Full code  Family Communication: family was present at bedside, at the time of interview.  The pt provided permission to discuss medical plan with the family. Opportunity was given to ask question and all questions were answered satisfactorily.    Disposition:  Pt is from Home, admitted with fall and L1 fracture, still has significant pain, unable to ambulate, s/p kyphoplasty done on 2/29, which precludes a safe discharge. Discharge to TBD, when clinically stable.  Subjective: No significant events overnight, in the morning time patient was trying to move herself into bed so she twisted herself and heard a popping sound, she was hurting so bad, she was crying due to severe pain.  IV Toradol ordered in addition to Dilaudid. Patient denies any other active issues.  Feels improvement in the edema of lower extremities.  Physical Exam: General: NAD, lying comfortably Appear in no distress, affect appropriate Eyes: PERRLA ENT: Oral Mucosa Clear, moist  Neck: no JVD,  Cardiovascular: S1 and S2 Present, no Murmur,  Respiratory: good respiratory effort, Bilateral Air entry equal and Decreased, no Crackles, no wheezes Abdomen: Bowel Sound present, Soft and no tenderness,  Skin: no rashes Extremities: 2+ Pedal edema, no calf tenderness Neurologic: without any new focal findings Gait not checked due to patient safety concerns  Vitals:   08/20/22 1500 08/20/22 1659 08/20/22 2344 08/21/22 0741  BP: (!) 119/41 (!) 109/50 (!) 126/58 121/65  Pulse: 71 77 88 64  Resp: '15 15 16 16  '$ Temp: 98.1 F (36.7 C) 98.5 F (36.9 C) 98.7 F (37.1 C) 97.8 F (36.6 C)  TempSrc: Oral     SpO2: 94% 95% 92% 94%  Weight:      Height:        Intake/Output Summary (Last 24 hours) at 08/21/2022 1551 Last data filed at 08/21/2022 1540 Gross per 24 hour  Intake --  Output 3250 ml  Net -3250 ml    Filed Weights   08/11/22 1218 08/20/22 0929  Weight: (!) 147.4 kg (!) 147.4 kg    Data Reviewed: I have personally reviewed and interpreted daily labs, tele strips, imagings as discussed above. I reviewed all nursing notes, pharmacy notes, vitals, pertinent old records I have discussed plan of care as described above with RN and  patient/family.  CBC: Recent Labs  Lab 08/17/22 0322 08/18/22 0302 08/19/22 0356 08/20/22 0728 08/21/22 0242  WBC 6.0 5.8 6.8 5.6 7.7  HGB 11.8* 11.5* 11.7* 13.3 13.1  HCT 36.3 35.9* 36.3 41.1 40.0  MCV 89.4 90.9 90.5 90.5 88.9  PLT 230 226 206 243 [id number]   Basic Metabolic Panel: Recent Labs  Lab 08/16/22 0504 08/17/22 0322 08/18/22 0302 08/19/22 0356 08/20/22 0728 08/21/22 0242  NA 139 138 139 139 139 138  K 3.4* 3.4* 3.6 3.8 3.5 3.2*  CL 105 104 104 106 99 93*  CO2 '28 27 29 26 31 '$ 33*  GLUCOSE 107* 104* 110* 105* 110* 107*  BUN '16 17 18 17 17 18  '$ CREATININE 0.70 0.69 0.88 0.71 0.75 0.78  CALCIUM 8.5* 8.7* 8.5* 8.6* 9.0 9.3  MG 1.9 2.1 2.3 2.3  --   --   PHOS 3.8 4.6 4.0 4.1  --   --     Studies: DG Lumbar Spine 2-3 Views  Result Date: 08/21/2022 CLINICAL DATA:  Acute lower back pain after possible injury. EXAM: LUMBAR SPINE - 2-3 VIEW COMPARISON:  August 14, 2022. FINDINGS: Status post  L1 kyphoplasty. No acute fracture is noted. Minimal degenerative disc disease is noted at L3-4 and L4-5. Minimal grade 1 anterolisthesis of L4-5 is noted secondary to posterior facet joint hypertrophy. IMPRESSION: Status post L1 kyphoplasty. Multilevel degenerative disc disease. No acute abnormality seen. Electronically Signed   By: Marijo Conception M.D.   On: 08/21/2022 12:49    Scheduled Meds:  acetaminophen  650 mg Oral Q6H   amLODipine  5 mg Oral Daily   calcitonin (salmon)  1 spray Alternating Nares Daily   vitamin B-12  1,000 mcg Oral Daily   diclofenac Sodium  2 g Topical QID   enoxaparin (LOVENOX) injection  0.5 mg/kg Subcutaneous Q24H   furosemide  40 mg Intravenous Q12H   gabapentin  300 mg Oral TID   ketorolac  15 mg Intravenous Q8H   lamoTRIgine  200 mg Oral QHS   mometasone-formoterol  2 puff Inhalation BID   nystatin   Topical BID   pantoprazole  40 mg Oral BID   polyethylene glycol  17 g Oral BID   senna-docusate  1 tablet Oral BID   topiramate  50 mg Oral QHS    traZODone  50 mg Oral QHS   valACYclovir  1,000 mg Oral Daily   venlafaxine XR  150 mg Oral Daily   Vitamin D (Ergocalciferol)  50,000 Units Oral Q7 days   Continuous Infusions:   PRN Meds: albuterol, bisacodyl, busPIRone, HYDROmorphone (DILAUDID) injection, LORazepam, ondansetron, mouth rinse, oxyCODONE, oxyCODONE, tiZANidine  Time spent: 50 minutes  Author: Val Riles. MD [facility] 08/21/2022 3:51 PM  To reach On-call, see care teams to locate the attending and reach out to them via [email or web address]. If 7PM-7AM, please contact night-coverage If you still have difficulty reaching the attending provider, please page the [facility] (Director on Call) for [facility] on amion for assistance.

## 2022-08-21 NOTE — Evaluation (Signed)
Physical Therapy Re-Evaluation Patient Details Name: Brittany Cobb MRN: [id number] DOB: 05/15/1971 Today's Date: 08/21/2022  History of Present Illness  Brittany Cobb is a 52 year old female with history of depression, anxiety, hypertension, GERD, morbid obesity, history of urinary retention, with no history of asthma, who presents emergency department for chief concerns of new acute low back pain. Patient slipped and fell on her bottom and has had difficulty ambulating since.  Found to have L1 compression fracture. Pt underwent Kyphoplasty on 08/20/22.  Clinical Impression  PT re-evaluation performed s/p L1 kyphoplasty (PT/OT co-re-evaluation).  Prior to injury, pt was independent with functional mobility; lives alone in 1 level home with ramp to enter.  Pt reporting 10/10 L low back pain throughout session (pt received pain medication prior to session)--pt reporting painful area was site she felt "pop" this morning (MD aware of "pop" and follow up imaging already performed).  During session pt c/o L low back pain that radiated down the back of her L LE and also some R knee pain (pt with minimal WB'ing through R LE d/t R knee pain during sessions activities).  Currently pt is mod assist semi-supine to sitting edge of bed via logrolling; lumbar corset donned in sitting for comfort; min assist x2 for transfers using RW; and min assist x2 to side step to R along bed a couple feet and then (after sitting rest) min assist x2 to take steps bed to [facility] with RW use (for bath with NT assist).  Pt would currently benefit from skilled PT to address noted impairments and functional limitations (see below for any additional details).  Upon hospital discharge, pt would benefit from ongoing therapy.   If pt discharges home, anticipate pt will require manual w/c in order to manage at home (d/t limited mobility).  PT POC reviewed and updated.   Recommendations for follow up therapy are one component of a multi-disciplinary  discharge planning process, led by the attending physician.  Recommendations may be updated based on patient status, additional functional criteria and insurance authorization.  Follow Up Recommendations Skilled nursing-short term rehab (<3 hours/day) Can patient physically be transported by private vehicle: No    Assistance Recommended at Discharge Intermittent Supervision/Assistance  Patient can return home with the following  A lot of help with walking and/or transfers;A lot of help with bathing/dressing/bathroom;Help with stairs or ramp for entrance;Assist for transportation;Assistance with cooking/housework    Equipment Recommendations Rolling walker (2 wheels);BSC/3in1;Wheelchair (measurements PT);Wheelchair cushion (measurements PT);Other (comment) (Pt has ramp at home.)  Recommendations for Other Services       Functional Status Assessment Patient has had a recent decline in their functional status and demonstrates the ability to make significant improvements in function in a reasonable and predictable amount of time.     Precautions / Restrictions Precautions Precautions: Knee;Back;Fall Required Braces or Orthoses: Spinal Brace Spinal Brace: Lumbar corset (for comfort per neurosurgery) Restrictions Weight Bearing Restrictions: No      Mobility  Bed Mobility Overal bed mobility: Needs Assistance Bed Mobility: Supine to Sit, Rolling (via logrolling) Rolling: Min assist Sidelying to sit: Mod assist       General bed mobility comments: assist for trunk    Transfers Overall transfer level: Needs assistance Equipment used: Rolling walker (2 wheels) Transfers: Sit to/from Stand, Bed to chair/wheelchair/BSC Sit to Stand: From elevated surface, +2 physical assistance, Min assist   Step pivot transfers: Min assist, +2 physical assistance, From elevated surface       General transfer comment:  minimal WB'ing through R LE; decreased stance time R LE; decreased B LE step  length/foot clearance; R knee flexed during any R LE WB'ing attempt    Ambulation/Gait                  Stairs            Wheelchair Mobility    Modified Rankin (Stroke Patients Only)       Balance Overall balance assessment: Needs assistance Sitting-balance support: No upper extremity supported, Feet supported Sitting balance-Leahy Scale: Good Sitting balance - Comments: steady sitting reaching within BOS and within spinal precautions (but c/o pain in sitting in general)   Standing balance support: Bilateral upper extremity supported, During functional activity, Reliant on assistive device for balance Standing balance-Leahy Scale: Fair Standing balance comment: Heavy reliance on walker for support                             Pertinent Vitals/Pain Pain Assessment Pain Assessment: 0-10 Pain Score: 10-Worst pain ever Pain Location: L low back and L LE (pain down back of leg); mild pain R knee Pain Descriptors / Indicators: Sore, Grimacing, Guarding, Shooting Pain Intervention(s): Limited activity within patient's tolerance, Monitored during session, Premedicated before session, Repositioned    Home Living Family/patient expects to be discharged to:: Private residence Living Arrangements: Alone Available Help at Discharge: Friend(s);Available PRN/intermittently Type of Home: Mobile home Home Access: Ramped entrance       Home Layout: One level Home Equipment: None      Prior Function Prior Level of Function : Independent/Modified Independent;Driving             Mobility Comments: Independent ADLs Comments: Independent     Hand Dominance        Extremity/Trunk Assessment   Upper Extremity Assessment Upper Extremity Assessment: Overall WFL for tasks assessed    Lower Extremity Assessment Lower Extremity Assessment:  (L LE at least 3/5 AROM hip flexion, knee flexion/extension and DF) RLE Deficits / Details: R hip flexion at least  2+/5; R knee flexion/extension at least 2+/5; DF at least 3/5 RLE: Unable to fully assess due to pain RLE Coordination: decreased gross motor    Cervical / Trunk Assessment Cervical / Trunk Assessment: Normal;Back Surgery  Communication   Communication: No difficulties  Cognition Arousal/Alertness: Awake/alert Behavior During Therapy: WFL for tasks assessed/performed Overall Cognitive Status: Within Functional Limits for tasks assessed                                          General Comments  Nursing cleared pt for participation in physical therapy.  Pt agreeable to PT session.    Exercises     Assessment/Plan    PT Assessment Patient needs continued PT services  PT Problem List Pain;Decreased activity tolerance;Decreased mobility;Decreased knowledge of use of DME;Decreased strength;Decreased balance;Decreased knowledge of precautions       PT Treatment Interventions DME instruction;Gait training;Functional mobility training;Therapeutic activities;Patient/family education;Therapeutic exercise;Modalities    PT Goals (Current goals can be found in the Care Plan section)  Acute Rehab PT Goals Patient Stated Goal: to return home PT Goal Formulation: With patient Time For Goal Achievement: 08/27/22 Potential to Achieve Goals: Good    Frequency 7X/week     Co-evaluation PT/OT/SLP Co-Evaluation/Treatment: Yes Reason for Co-Treatment: To address functional/ADL transfers;For patient/therapist safety PT goals  addressed during session: Mobility/safety with mobility;Balance;Proper use of DME OT goals addressed during session: ADL's and self-care       AM-PAC PT "6 Clicks" Mobility  Outcome Measure Help needed turning from your back to your side while in a flat bed without using bedrails?: A Little Help needed moving from lying on your back to sitting on the side of a flat bed without using bedrails?: A Lot Help needed moving to and from a bed to a chair  (including a wheelchair)?: A Lot Help needed standing up from a chair using your arms (e.g., wheelchair or bedside chair)?: A Lot Help needed to walk in hospital room?: A Lot Help needed climbing 3-5 steps with a railing? : Total 6 Click Score: 12    End of Session Equipment Utilized During Treatment: Back brace Activity Tolerance: Patient limited by pain Patient left:  (on BSC with 2 NT's present assisting pt with bath) Nurse Communication: Mobility status;Precautions PT Visit Diagnosis: Other abnormalities of gait and mobility (R26.89);History of falling (Z91.81);Difficulty in walking, not elsewhere classified (R26.2);Pain;Muscle weakness (generalized) (M62.81) Pain - Right/Left: Right Pain - part of body: Knee    Time: [id number] PT Time Calculation (min) (ACUTE ONLY): 28 min   Charges:   PT Evaluation $PT Re-evaluation: 1 Re-eval PT Treatments $Therapeutic Activity: 8-22 mins       Leitha Bleak, PT 08/21/22, 4:52 PM

## 2022-08-21 NOTE — Progress Notes (Addendum)
Supervising Physician: Juliet Rude  Patient Status:  [name] - In-pt  Chief Complaint:  L1 compression fracture s/p L1 kyphoplasty 08/20/22 with Dr Denna Haggard   Subjective:  Patient lying in bed accompanied by family at bedside. She is alert and oriented. She reports bilateral radiating pain from her back to her lower extremities, noting that it is worse on the left side. The patient reports trying to push herself up in bed earlier this morning when she "heard a pop" from her back and immediately felt pain that she characterized as a 15/10 pain level. She denies any bowel or bladder incontinence and/or urinary retention at this time.  Allergies: Bactrim [sulfamethoxazole-trimethoprim]  Medications: Prior to Admission medications   Medication Sig Start Date End Date Taking? Authorizing Provider  amLODipine (NORVASC) 5 MG tablet Take 5 mg by mouth daily. 07/21/22 07/16/23 Yes [provider]  budesonide-formoterol (SYMBICORT) 160-4.5 MCG/ACT inhaler Inhale 2 puffs into the lungs 2 (two) times daily.   Yes [provider]  busPIRone (BUSPAR) 15 MG tablet Take 15 mg by mouth 2 (two) times daily as needed. 08/05/22 08/05/23 Yes [provider]  furosemide (LASIX) 20 MG tablet Take 20 mg by mouth 2 (two) times daily. 07/21/22  Yes [provider]  HYDROcodone-acetaminophen (NORCO) 10-325 MG tablet Take 1 tablet by mouth every 8 (eight) hours as needed for moderate pain or severe pain.   Yes [provider]  lamoTRIgine (LAMICTAL) 200 MG tablet Take 200 mg by mouth at bedtime. 07/21/22 07/16/23 Yes [provider]  omeprazole (PRILOSEC) 20 MG capsule Take 20 mg by mouth in the morning and at bedtime.   Yes [provider]  topiramate (TOPAMAX) 50 MG tablet Take 50 mg by mouth at bedtime. 07/21/22  Yes [provider]  traZODone (DESYREL) 50 MG tablet Take 50 mg by mouth at bedtime. 07/21/22  Yes [provider]   valACYclovir (VALTREX) 1000 MG tablet Take 1,000 mg by mouth daily. 08/07/16  Yes [provider]  venlafaxine XR (EFFEXOR-XR) 150 MG 24 hr capsule Take 1 capsule by mouth daily. 07/21/22  Yes [provider]  VENTOLIN HFA 108 (90 Base) MCG/ACT inhaler  07/21/22  Yes [provider]  ALPRAZolam (XANAX) 0.5 MG tablet Take 0.5 mg by mouth at bedtime as needed for anxiety. Patient not taking: Reported on 08/11/2022    [provider]  fluconazole (DIFLUCAN) 150 MG tablet Take 1 tablet (150 mg total) by mouth daily. Patient not taking: Reported on 08/11/2022 11/06/16   Norval Gable, MD  hydrochlorothiazide (HYDRODIURIL) 25 MG tablet Take 37.5 mg by mouth daily. Patient not taking: Reported on 08/11/2022    [provider]  LORazepam (ATIVAN) 0.5 MG tablet Take 0.5 mg by mouth every 8 (eight) hours as needed for anxiety. Patient not taking: Reported on 08/11/2022 04/21/22   [provider]  nitrofurantoin, macrocrystal-monohydrate, (MACROBID) 100 MG capsule Take 1 capsule (100 mg total) by mouth 2 (two) times daily. Patient not taking: Reported on 08/11/2022 11/06/16   Norval Gable, MD     Vital Signs: BP 121/65 (BP Location: Right Arm)   Pulse 64   Temp 97.8 F (36.6 C)   Resp 16   Ht '5\' 8"'$  (1.727 m)   Wt (!) 324 lb 15.3 oz (147.4 kg)   LMP 03/18/2015 (Approximate)   SpO2 94%   BMI 49.41 kg/m   Physical Exam Vitals reviewed.  Constitutional:      General: She is not  in acute distress.    Appearance: She is not ill-appearing.  Eyes:     Pupils: Pupils are equal, round, and reactive to light.  Cardiovascular:     Rate and Rhythm: Normal rate and regular rhythm.     Pulses: Normal pulses.     Heart sounds: Normal heart sounds.  Pulmonary:     Effort: Pulmonary effort is normal.     Breath sounds: Normal breath sounds.  Abdominal:     Palpations: Abdomen is soft.     Tenderness: There is no abdominal tenderness.  Skin:     General: Skin is warm and dry.  Neurological:     Mental Status: She is alert and oriented to person, place, and time.     Sensory: No sensory deficit.  Psychiatric:        Mood and Affect: Mood normal.        Behavior: Behavior normal.        Thought Content: Thought content normal.        Judgment: Judgment normal.    Neurological Exam Alert, awake, and oriented x3 Speech and comprehension intact PERRL  EOMs intact, no nystagmus or subjective diplopia. No facial asymmetry. Tongue midline  Motor power 5/5 in upper extremities, 3/5 in lower extremities Sensation intact bilaterally in all extremities No pronator drift. Fine motor and coordination intact Gait not assessed    Imaging: IR KYPHO LUMBAR INC FX REDUCE BONE BX UNI/BIL CANNULATION INC/IMAGING  Result Date: 08/20/2022 INDICATION: Acute to subacute L1 compression fracture with pain refractory to conservative measures EXAM: L1 vertebral body augmentation using balloon kyphoplasty COMPARISON:  None Available. MEDICATIONS: Documented in the EMR ANESTHESIA/SEDATION: Moderate (conscious) sedation was employed during this procedure. A total of Versed 3 mg and Fentanyl 150 mcg was administered intravenously by the radiology nurse. Total intra-service moderate Sedation Time: 42 minutes. The patient's level of consciousness and vital signs were monitored continuously by radiology nursing throughout the procedure under my direct supervision. FLUOROSCOPY: Radiation Exposure Index (as provided by the fluoroscopic device): 5.8 minutes ([id number] mGy) COMPLICATIONS: None immediate. PROCEDURE: Informed written consent was obtained from the patient after a thorough discussion of the procedural risks, benefits and alternatives. All questions were addressed. Maximal Sterile Barrier Technique was utilized including caps, mask, sterile gowns, sterile gloves, sterile drape, hand hygiene and skin antiseptic. A timeout was performed prior to the initiation of  the procedure. The patient was positioned prone on the exam table. A bipedicular access was planned. Skin entry site(s) were marked overlying the L1 vertebral body using fluoroscopy. The overlying skin was then prepped and draped in the standard sterile fashion. Local analgesia was obtained with 1% lidocaine. Attention was first turned to the right side. Under fluoroscopic guidance, a 10 gauge introducer needle was advanced towards the lateral margin of the pedicle. Using multiple projections, the introducer needle was advanced towards the posterior margin of the vertebral body via a transpedicular approach. The inner needle was then removed, and the drill was advanced towards the anterior margin of the vertebral body. Attention was then turned to the contralateral side. Under fluoroscopic guidance, a 10 gauge introducer needle was advanced towards the lateral margin of the pedicle. Using multiple projections, the introducer needle was advanced towards the posterior margin of the vertebral body via a transpedicular approach. The inner needle was then removed, and the drill was advanced towards the anterior margin of the vertebral body. The Kyphon 15 mm inflatable bone tamps were then advanced through the bilateral transpedicular  access needles and positioned within the mid vertebral body. Kyphoplasty was then performed, ensuring that the balloon contours stayed within the vertebral body margins. The balloons were then deflated and removed, followed by advancement of the bone filler devices bilaterally and the instillation of acrylic bone cement with excellent filling in the AP and lateral projections. No extravasation was noted in the disk spaces or posteriorly into the spinal canal. No epidural venous contamination was seen. At the end of the procedure, the introducer cannulas and bone filler devices were then removed without difficulty. Clean dressings were placed after hemostasis. The patient tolerated all  aspects of the procedure well, and was transferred to recovery in stable condition. IMPRESSION: 1. Successful L1 vertebral body augmentation using balloon kyphoplasty. If the patient has known osteoporosis, recommend treatment as clinically indicated. If the patient's bone density status is unknown, DEXA scan is recommended. Electronically Signed   By: Albin Felling M.D.   On: 08/20/2022 13:39    Labs:  CBC: Recent Labs    08/18/22 0302 08/19/22 0356 08/20/22 0728 08/21/22 0242  WBC 5.8 6.8 5.6 7.7  HGB 11.5* 11.7* 13.3 13.1  HCT 35.9* 36.3 41.1 40.0  PLT 226 206 243 265    COAGS: Recent Labs    08/19/22 1001  INR 1.0    BMP: Recent Labs    08/18/22 0302 08/19/22 0356 08/20/22 0728 08/21/22 0242  NA 139 139 139 138  K 3.6 3.8 3.5 3.2*  CL 104 106 99 93*  CO2 '29 26 31 '$ 33*  GLUCOSE 110* 105* 110* 107*  BUN '18 17 17 18  '$ CALCIUM 8.5* 8.6* 9.0 9.3  CREATININE 0.88 0.71 0.75 0.78  GFRNONAA >60 >60 >60 >60    LIVER FUNCTION TESTS: No results for input(s): "BILITOT", "AST", "ALT", "ALKPHOS", "PROT", "ALBUMIN" in the last 8760 hours.  Assessment and Plan:  Lower back pain with bilateral radiculopathy s/p L1 kyphoplasty Brittany Cobb is a 52 yo female 1 day s/p kyphoplasty for a compression fracture of L1 vertebrae. The patient reports hearing a "pop" in her back when she moved earlier and has been experiencing "15/10" pain since that time that radiates into both lower extremities. Lower extremity motor strength was 3/5 bilaterally, but this potentially may have been due to decreased effort secondary to pain levels. Pulses and sensation are intact in bilateral lower extremities.   -2 view lumbar spine x-ray ordered to further evaluate patient's reported symptoms -Further management per hospitalist team -IR will continue to follow, please contact IR team with any questions or concerns.  Addendum: -Lumbar spine x-rays reveals no acute abnormalities. No role for further IR  intervention at this time.  -Recommend further evaluation with spine specialist if patient continues to experience symptoms  Electronically Signed: Lura Em, PA-C 08/21/2022, 11:56 AM   I spent a total of 35 Minutes at the the patient's bedside AND on the patient's hospital floor or unit, greater than 50% of which was counseling/coordinating care for lower back pain with bilateral radiculopathy s/p L1 kyphoplasty.

## 2022-08-21 NOTE — Evaluation (Signed)
Occupational Therapy Re-Evaluation Patient Details Name: Brittany Cobb MRN: [id number] DOB: April 15, 1971 Today's Date: 08/21/2022   History of Present Illness [name] is a 52 year old female with history of depression, anxiety, hypertension, GERD, morbid obesity, history of urinary retention, with no history of asthma, who presents emergency department for chief concerns of new acute low back pain. Patient slipped and fell on her bottom and has had difficulty ambulating since.  Found to have L1 compression fracture. Pt underwent Kyphoplasty on 08/20/22.   Clinical Impression   Ms. Brienza was seen for OT Re-evaluation POD #1 from above procedure. She continues to demonstrate deficits in functional independence with ADL management 2/2 significant back and BLE pain. She requires MAX A for LB ADL Management from STS. MOD A for bed mobility using log roll technique, MIN A +2 for STS and functional transfers including step pivot t/f to [facility]. See ADL section below for additional detail. Pt continues to benefit from skilled OT services to maximize safety and return to PLOF. POC updated to reflect current functional needs. Frequency and DC recommendation remains appropriate.      Recommendations for follow up therapy are one component of a multi-disciplinary discharge planning process, led by the attending physician.  Recommendations may be updated based on patient status, additional functional criteria and insurance authorization.   Follow Up Recommendations  Skilled nursing-short term rehab (<3 hours/day)     Assistance Recommended at Discharge Intermittent Supervision/Assistance  Patient can return home with the following A lot of help with walking and/or transfers;A lot of help with bathing/dressing/bathroom;Help with stairs or ramp for entrance;Assistance with cooking/housework;Assist for transportation    Functional Status Assessment  Patient has had a recent decline in their functional status  and demonstrates the ability to make significant improvements in function in a reasonable and predictable amount of time.  Equipment Recommendations  BSC/3in1    Recommendations for Other Services       Precautions / Restrictions Precautions Precautions: Knee;Back;Fall Required Braces or Orthoses: Spinal Brace Spinal Brace: Lumbar corset (for comfort per MD.) Restrictions Weight Bearing Restrictions: No      Mobility Bed Mobility Overal bed mobility: Needs Assistance Bed Mobility: Supine to Sit Rolling: Min assist Sidelying to sit: Mod assist            Transfers Overall transfer level: Needs assistance Equipment used: Rolling walker (2 wheels) Transfers: Sit to/from Stand, Bed to chair/wheelchair/BSC Sit to Stand: From elevated surface, +2 physical assistance, Min assist     Step pivot transfers: Min assist, +2 physical assistance, From elevated surface            Balance Overall balance assessment: Needs assistance Sitting-balance support: Feet supported Sitting balance-Leahy Scale: Good     Standing balance support: Bilateral upper extremity supported, During functional activity, Reliant on assistive device for balance Standing balance-Leahy Scale: Fair Standing balance comment: heavy reliance on walker for support                           ADL either performed or assessed with clinical judgement   ADL Overall ADL's : Needs assistance/impaired     Grooming: Sitting;Wash/dry face;Set up;Supervision/safety               Lower Body Dressing: Maximal assistance;Sit to/from stand;+2 for physical assistance   Toilet Transfer: Minimal assistance;Rolling walker (2 wheels);+2 for physical assistance   Toileting- Clothing Manipulation and Hygiene: +2 for physical assistance;Moderate assistance;With adaptive equipment  Functional mobility during ADLs: +2 for physical assistance;Minimal assistance;Rolling walker (2 wheels) General ADL  Comments: Pt remains functionally limited by increased pain in low back, LLE and RLE. She requires +2 MIN A for SPT to BSC, STS t/f x3. MOD A for bed mobility with cueing for log-roll technique.     Vision Patient Visual Report: No change from baseline       Perception     Praxis      Pertinent Vitals/Pain Pain Assessment Pain Assessment: 0-10 Pain Score: 10-Worst pain ever Pain Location: back, LE Pain Descriptors / Indicators: Sore, Grimacing, Guarding, Shooting Pain Intervention(s): Limited activity within patient's tolerance, Monitored during session, Premedicated before session, Repositioned, Utilized relaxation techniques     Hand Dominance     Extremity/Trunk Assessment Upper Extremity Assessment Upper Extremity Assessment: Overall WFL for tasks assessed   Lower Extremity Assessment Lower Extremity Assessment: Defer to PT evaluation;RLE deficits/detail RLE Deficits / Details: painful right side due to fall. Unable to weight bear on right in standing RLE Coordination: decreased gross motor   Cervical / Trunk Assessment Cervical / Trunk Assessment: Normal;Back Surgery   Communication Communication Communication: No difficulties   Cognition Arousal/Alertness: Awake/alert Behavior During Therapy: WFL for tasks assessed/performed Overall Cognitive Status: Within Functional Limits for tasks assessed                                       General Comments       Exercises Other Exercises Other Exercises: Pt educated on role of OT, LSO donning/wear schedule, strategies for maximizing safety and functional independence during ADL management in setting of back precautions, bed mobility, and safe transfer technique.   Shoulder Instructions      Home Living Family/patient expects to be discharged to:: Private residence Living Arrangements: Alone Available Help at Discharge: Friend(s);Available PRN/intermittently Type of Home: Mobile home Home Access:  Ramped entrance     Home Layout: One level     Bathroom Shower/Tub: Chief Strategy Officer: None          Prior Functioning/Environment Prior Level of Function : Independent/Modified Independent;Driving             Mobility Comments: independent ADLs Comments: independent        OT Problem List: Decreased strength;Decreased range of motion;Decreased activity tolerance;Impaired balance (sitting and/or standing)      OT Treatment/Interventions: Self-care/ADL training;Therapeutic exercise;Energy conservation;DME and/or AE instruction;Therapeutic activities;Patient/family education;Balance training    OT Goals(Current goals can be found in the care plan section) Acute Rehab OT Goals Patient Stated Goal: To go home OT Goal Formulation: With patient Time For Goal Achievement: 09/04/22 Potential to Achieve Goals: Good  OT Frequency: Min 2X/week    Co-evaluation PT/OT/SLP Co-Evaluation/Treatment: Yes Reason for Co-Treatment: To address functional/ADL transfers;For patient/therapist safety PT goals addressed during session: Mobility/safety with mobility;Balance;Proper use of DME OT goals addressed during session: ADL's and self-care      AM-PAC OT "6 Clicks" Daily Activity     Outcome Measure Help from another person eating meals?: None Help from another person taking care of personal grooming?: A Little Help from another person toileting, which includes using toliet, bedpan, or urinal?: A Little Help from another person bathing (including washing, rinsing, drying)?: A Lot Help from another person to put on and taking off regular upper body clothing?: A Little Help from another person to  put on and taking off regular lower body clothing?: A Lot 6 Click Score: 17   End of Session Equipment Utilized During Treatment: Rolling walker (2 wheels)  Activity Tolerance: Patient tolerated treatment well Patient left:  (Seated on BSC with NT in room to begin  wash up.)  OT Visit Diagnosis: Other abnormalities of gait and mobility (R26.89);Muscle weakness (generalized) (M62.81)                Time: [id number] OT Time Calculation (min): 28 min Charges:  OT General Charges $OT Visit: 1 Visit OT Evaluation $OT Re-eval: 1 Re-eval OT Treatments $Self Care/Home Management : 8-22 mins  Shara Blazing, M.S., OTR/L 08/21/22, 3:57 PM

## 2022-08-22 LAB — MAGNESIUM: Magnesium: 2.3 mg/dL (ref 1.7–2.4)

## 2022-08-22 LAB — CBC
HCT: 39.9 % (ref 36.0–46.0)
Hemoglobin: 12.7 g/dL (ref 12.0–15.0)
MCH: 28.5 pg (ref 26.0–34.0)
MCHC: 31.8 g/dL (ref 30.0–36.0)
MCV: 89.5 fL (ref 80.0–100.0)
Platelets: 251 10*3/uL (ref 150–400)
RBC: 4.46 MIL/uL (ref 3.87–5.11)
RDW: 13.4 % (ref 11.5–15.5)
WBC: 6.8 10*3/uL (ref 4.0–10.5)
nRBC: 0 % (ref 0.0–0.2)

## 2022-08-22 LAB — BASIC METABOLIC PANEL
Anion gap: 13 (ref 5–15)
BUN: 30 mg/dL — ABNORMAL HIGH (ref 6–20)
CO2: 29 mmol/L (ref 22–32)
Calcium: 9.2 mg/dL (ref 8.9–10.3)
Chloride: 95 mmol/L — ABNORMAL LOW (ref 98–111)
Creatinine, Ser: 0.9 mg/dL (ref 0.44–1.00)
GFR, Estimated: >60 mL/min (ref >60)
Glucose, Bld: 119 mg/dL — ABNORMAL HIGH (ref 70–99)
Potassium: 3.3 mmol/L — ABNORMAL LOW (ref 3.5–5.1)
Sodium: 137 mmol/L (ref 135–145)

## 2022-08-22 LAB — PHOSPHORUS: Phosphorus: 4.3 mg/dL (ref 2.5–4.6)

## 2022-08-22 MED ORDER — POTASSIUM CHLORIDE CRYS ER 20 MEQ PO TBCR
40.0000 meq | EXTENDED_RELEASE_TABLET | ORAL | Status: AC
  Administered 2022-08-22 (×2): 40 meq via ORAL
  Filled 2022-08-22 (×2): qty 2

## 2022-08-22 NOTE — Plan of Care (Signed)

## 2022-08-22 NOTE — Progress Notes (Signed)
[facility] Progress Note  Patient: Brittany Cobb    [id number]  DOA: 08/11/2022     Date of Service: the patient was seen and examined on 08/22/2022  Chief Complaint  Patient presents with   Back Pain   Brief hospital course: 52 year old with past medical history significant for depression, anxiety, hypertension, GERD, morbid obesity, history of urinary retention, who presents to the ED complaining of acute low back pain.  She slipped and fell onto her back  and had difficulty ambulating since.     She was found to have Acute appearing superior endplate compression deformity at L1 with approximately 40% height loss. No evidence of retropulsion. No evidence of an epidural hematoma.  Moderate spinal canal stenosis at L4-L5 secondary to combination of a small disc bulge and ligamentum flavum hypertrophy. Moderate right-sided neural foraminal stenosis at L4-L5.  Assessment and Plan: Principal Problem:   Inadequate pain control Active Problems:   Obesity, Class III, BMI 40-49.9 (morbid obesity) (HCC)   Essential hypertension   GERD (gastroesophageal reflux disease)   Anxiety   Grieving   Compression fracture of L1 lumbar vertebra, closed, initial encounter ([place])     # Back pain: L1 Compression Fracture:  CT lumbar spine: Acute appearing superior endplate compression deformity at L1 with approximately 40% height loss. No evidence of retropulsion. No evidence of an epidural hematoma.  Moderate spinal canal stenosis at L4-L5 secondary to combination of a small disc bulge and ligamentum flavum hypertrophy. Moderate right-sided neural foraminal stenosis at L4-L5. -neurosurgery recommend pain management and LSO brace.  -lidocaine Patch.  -Received  Toradol IV for two days. S/p Ibuprofen to Q 8 hours for 2 days. -Continue with oxycodone. Neurosurgery increase dose 2/23. -PT /OT consulted, recommend SNF< patient unable to afford SNF> and would like to go home.  -Repeated CT spine fracture  stable.  Started on Gabapentin and Zanaflex 2/23 by neurosurgery.  2/25 started calcitonin nasal spray 2/29 s/p kyphoplasty done by IR  Continue Tylenol 650 every 6 hourly scheduled, oxycodone oral as needed and Dilaudid IV as needed for breakthrough pain 3/1 Toradol 15 mg IV every 8 hourly x 2 doses ordered due to severe pain Lumbar x-ray did not show any acute findings   # Cellulitis, patient has mild arrhythmia right antiviral.  After IV insertion S/p Augmentin twice daily for 3 days   # Anxiety: Continue with trazodone, Venlafaxine  and lorazepam as needed   # GERD: Continue with PPI. # Essential hypertension: Continue with amlodipine and furosemide.  As needed hydralazine 2/28 c/o worsening of lower extremity edema, increase Lasix from 20 to 40 mg p.o. twice daily and metolazone 5 mg one-time dose given. 2/29 changed Lasix 40 mg IV twice daily  # Hypokalemia secondary to diuresis, potassium repleted. Monitor electrolytes and replete as needed.  # Obesity class III BMI 49: Need lifestyle modification # Constipation: Continue  Miralax, senna. Dulcolax supp   # Bilateral Knee pain: right Knee CT negative for fracture, osteoarthritis.  Voltaren gel ordered.  Plan to get X ray left knee.    # Grieving - Her mother passed in November 2023 from fistula, sepsis  # Vitamin D level 30 at lower end so started vitamin D 50,000 units p.o. weekly, follow with PCP to repeat vitamin D level after 3 to 6 months. # Vitamin B12 level 351, goal >400, started vitamin B12 1000 mcg p.o. daily.   # Morbid obesity, Body mass index is 49.42 kg/m.  Interventions: Calorie restricted diet  and daily exercise advised to lose body weight after recovery      Diet: Regular diet DVT Prophylaxis: Subcutaneous Lovenox   Advance goals of care discussion: Full code  Family Communication: family was present at bedside, at the time of interview.  The pt provided permission to discuss medical plan with  the family. Opportunity was given to ask question and all questions were answered satisfactorily.   Disposition:  Pt is from Home, admitted with fall and L1 fracture, still has significant pain, unable to ambulate, s/p kyphoplasty done on 2/29, which precludes a safe discharge. Discharge to TBD, when clinically stable.  Subjective: No significant events overnight, patient still complaining of pain 11/10 but overall feels improvement, patient mobility is improving as well.  We will continue IV Lasix for diuresis for 1-2 more days, reeval tomorrow a.m. for discharge.  Physical Exam: General: NAD, lying comfortably Appear in no distress, affect appropriate Eyes: PERRLA ENT: Oral Mucosa Clear, moist  Neck: no JVD,  Cardiovascular: S1 and S2 Present, no Murmur,  Respiratory: good respiratory effort, Bilateral Air entry equal and Decreased, no Crackles, no wheezes Abdomen: Bowel Sound present, Soft and no tenderness,  Skin: no rashes Extremities: 2+ Pedal edema, no calf tenderness Neurologic: without any new focal findings Gait not checked due to patient safety concerns  Vitals:   08/21/22 0741 08/21/22 1618 08/22/22 0127 08/22/22 0951  BP: 121/65 114/62 (!) 108/56 126/62  Pulse: 64 89 72 82  Resp: '16 15 18 16  '$ Temp: 97.8 F (36.6 C) 97.9 F (36.6 C) 98.3 F (36.8 C) 98 F (36.7 C)  TempSrc:      SpO2: 94% 94% 92% 95%  Weight:      Height:        Intake/Output Summary (Last 24 hours) at 08/22/2022 1230 Last data filed at 08/21/2022 1540 Gross per 24 hour  Intake --  Output 200 ml  Net -200 ml    Filed Weights   08/11/22 1218 08/20/22 0929  Weight: (!) 147.4 kg (!) 147.4 kg    Data Reviewed: I have personally reviewed and interpreted daily labs, tele strips, imagings as discussed above. I reviewed all nursing notes, pharmacy notes, vitals, pertinent old records I have discussed plan of care as described above with RN and patient/family.  CBC: Recent Labs  Lab  08/18/22 0302 08/19/22 0356 08/20/22 0728 08/21/22 0242 08/22/22 0528  WBC 5.8 6.8 5.6 7.7 6.8  HGB 11.5* 11.7* 13.3 13.1 12.7  HCT 35.9* 36.3 41.1 40.0 39.9  MCV 90.9 90.5 90.5 88.9 89.5  PLT 226 206 243 265 [id number]   Basic Metabolic Panel: Recent Labs  Lab 08/16/22 0504 08/17/22 0322 08/18/22 0302 08/19/22 0356 08/20/22 0728 08/21/22 0242 08/22/22 0528  NA 139 138 139 139 139 138 137  K 3.4* 3.4* 3.6 3.8 3.5 3.2* 3.3*  CL 105 104 104 106 99 93* 95*  CO2 '28 27 29 26 31 '$ 33* 29  GLUCOSE 107* 104* 110* 105* 110* 107* 119*  BUN '16 17 18 17 17 18 '$ 30*  CREATININE 0.70 0.69 0.88 0.71 0.75 0.78 0.90  CALCIUM 8.5* 8.7* 8.5* 8.6* 9.0 9.3 9.2  MG 1.9 2.1 2.3 2.3  --   --  2.3  PHOS 3.8 4.6 4.0 4.1  --   --  4.3    Studies: DG Lumbar Spine 2-3 Views  Result Date: 08/21/2022 CLINICAL DATA:  Acute lower back pain after possible injury. EXAM: LUMBAR SPINE - 2-3 VIEW COMPARISON:  August 14, 2022. FINDINGS:  Status post L1 kyphoplasty. No acute fracture is noted. Minimal degenerative disc disease is noted at L3-4 and L4-5. Minimal grade 1 anterolisthesis of L4-5 is noted secondary to posterior facet joint hypertrophy. IMPRESSION: Status post L1 kyphoplasty. Multilevel degenerative disc disease. No acute abnormality seen. Electronically Signed   By: Marijo Conception M.D.   On: 08/21/2022 12:49    Scheduled Meds:  acetaminophen  650 mg Oral Q6H   amLODipine  5 mg Oral Daily   calcitonin (salmon)  1 spray Alternating Nares Daily   vitamin B-12  1,000 mcg Oral Daily   diclofenac Sodium  2 g Topical QID   enoxaparin (LOVENOX) injection  0.5 mg/kg Subcutaneous Q24H   furosemide  40 mg Intravenous Q12H   gabapentin  300 mg Oral TID   lamoTRIgine  200 mg Oral QHS   mometasone-formoterol  2 puff Inhalation BID   nystatin   Topical BID   pantoprazole  40 mg Oral BID   polyethylene glycol  17 g Oral BID   potassium chloride  40 mEq Oral Q4H   senna-docusate  1 tablet Oral BID   topiramate  50  mg Oral QHS   traZODone  50 mg Oral QHS   valACYclovir  1,000 mg Oral Daily   venlafaxine XR  150 mg Oral Daily   Vitamin D (Ergocalciferol)  50,000 Units Oral Q7 days   Continuous Infusions:   PRN Meds: albuterol, bisacodyl, busPIRone, HYDROmorphone (DILAUDID) injection, LORazepam, ondansetron, mouth rinse, oxyCODONE, oxyCODONE, tiZANidine  Time spent: 35 minutes  Author: Val Riles. MD [facility] 08/22/2022 12:30 PM  To reach On-call, see care teams to locate the attending and reach out to them via [email or web address]. If 7PM-7AM, please contact night-coverage If you still have difficulty reaching the attending provider, please page the [facility] (Director on Call) for [facility] on amion for assistance.

## 2022-08-22 NOTE — Progress Notes (Signed)
Physical Therapy Treatment Patient Details Name: Brittany Cobb MRN: [id number] DOB: [birth year] Today's Date: 08/22/2022   History of Present Illness [name] is a 52 year old female with history of depression, anxiety, hypertension, GERD, morbid obesity, history of urinary retention, with no history of asthma, who presents emergency department for chief concerns of new acute low back pain. Patient slipped and fell on her bottom and has had difficulty ambulating since.  Found to have L1 compression fracture. Pt underwent Kyphoplasty on 08/20/22.    PT Comments    Pt resting in bed upon PT arrival; pt pre-medicated with pain meds and received pain medication during session.  Pt reporting 12/10 pain at rest (pain increased with activity); pain L low back and buttocks area; also reporting knee pain (which increased with standing activities).  LSO brace donned in sitting which improved pt's pain/comfort.  During session pt SBA with bed mobility via logrolling; min assist to stand from elevated bed height x2 trials up to RW; and min assist to side step to R along bed a couple feet with RW use.  Pt with minimal weight through R LE during standing activities and pt reporting L knee buckling 2x's when attempting to take steps with RW use.  Pt continues to be very motivated to improve functional status but limited d/t pain.  If pt discharges home, anticipate pt will require manual w/c (as well as RW and BSC) in order to manage at home (d/t limited mobility); also currently recommending assist with all mobility for safety.   Recommendations for follow up therapy are one component of a multi-disciplinary discharge planning process, led by the attending physician.  Recommendations may be updated based on patient status, additional functional criteria and insurance authorization.  Follow Up Recommendations  Skilled nursing-short term rehab (<3 hours/day) Can patient physically be transported by private  vehicle: No   Assistance Recommended at Discharge Intermittent Supervision/Assistance  Patient can return home with the following A lot of help with walking and/or transfers;A lot of help with bathing/dressing/bathroom;Help with stairs or ramp for entrance;Assist for transportation;Assistance with cooking/housework   Equipment Recommendations  Rolling walker (2 wheels);BSC/3in1;Wheelchair (measurements PT);Wheelchair cushion (measurements PT);Other (comment) (Pt has ramp at home)    Recommendations for Other Services       Precautions / Restrictions Precautions Precautions: Knee;Back;Fall Required Braces or Orthoses: Spinal Brace Spinal Brace: Lumbar corset (for comfort per neurosurgery) Restrictions Weight Bearing Restrictions: No     Mobility  Bed Mobility Overal bed mobility: Needs Assistance Bed Mobility: Rolling, Sidelying to Sit, Sit to Sidelying Rolling: Supervision Sidelying to sit: Supervision, HOB elevated     Sit to sidelying: Supervision, HOB elevated General bed mobility comments: use of bed rail    Transfers Overall transfer level: Needs assistance Equipment used: Rolling walker (2 wheels) Transfers: Sit to/from Stand Sit to Stand: From elevated surface, Min assist           General transfer comment: minimal WB'ing through R LE; use of momentum to stand; x2 trials standing up to RW from elevated bed height    Ambulation/Gait Ambulation/Gait assistance: Min assist Gait Distance (Feet):  (sidestepped to R a couple feet along bed) Assistive device: Rolling walker (2 wheels)   Gait velocity: decreased     General Gait Details: minimal weight through R LE (pt pivoting on L LE at times instead of taking steps); vc's for technique; limited d/t R knee pain and L low back/buttocks pain; pt reporting L knee buckling 2x's  Stairs             Wheelchair Mobility    Modified Rankin (Stroke Patients Only)       Balance Overall balance  assessment: Needs assistance Sitting-balance support: No upper extremity supported, Feet supported Sitting balance-Leahy Scale: Good Sitting balance - Comments: steady sitting reaching within BOS and within spinal precautions   Standing balance support: Bilateral upper extremity supported, During functional activity, Reliant on assistive device for balance Standing balance-Leahy Scale: Fair Standing balance comment: Heavy reliance on walker for support                            Cognition Arousal/Alertness: Awake/alert Behavior During Therapy: WFL for tasks assessed/performed Overall Cognitive Status: Within Functional Limits for tasks assessed                                          Exercises General Exercises - Lower Extremity Long Arc Quad: AROM, Strengthening, Both, 10 reps, Seated    General Comments  Nursing cleared pt for participation in physical therapy.  Pt agreeable to PT session.      Pertinent Vitals/Pain Pain Assessment Pain Assessment: 0-10 Pain Score:  (12/10 at rest; pain increases with activity) Pain Location: L low back and buttocks > R knee Pain Descriptors / Indicators: Sore, Grimacing, Guarding, Shooting Pain Intervention(s): Limited activity within patient's tolerance, Monitored during session, Premedicated before session, Repositioned, RN gave pain meds during session    Home Living                          Prior Function            PT Goals (current goals can now be found in the care plan section) Acute Rehab PT Goals Patient Stated Goal: to return home PT Goal Formulation: With patient Time For Goal Achievement: 08/27/22 Potential to Achieve Goals: Good Progress towards PT goals: Progressing toward goals    Frequency    7X/week      PT Plan Current plan remains appropriate    Co-evaluation              AM-PAC PT "6 Clicks" Mobility   Outcome Measure  Help needed turning from your back  to your side while in a flat bed without using bedrails?: A Little Help needed moving from lying on your back to sitting on the side of a flat bed without using bedrails?: A Little Help needed moving to and from a bed to a chair (including a wheelchair)?: A Little Help needed standing up from a chair using your arms (e.g., wheelchair or bedside chair)?: A Little Help needed to walk in hospital room?: A Lot Help needed climbing 3-5 steps with a railing? : Total 6 Click Score: 15    End of Session Equipment Utilized During Treatment: Back brace Activity Tolerance: Patient limited by pain Patient left: in bed;with call bell/phone within reach;with bed alarm set;Other (comment) (pillows placed to optimize pt comfort) Nurse Communication: Mobility status;Precautions;Other (comment) (pt needing new purewick placed) PT Visit Diagnosis: Other abnormalities of gait and mobility (R26.89);History of falling (Z91.81);Difficulty in walking, not elsewhere classified (R26.2);Pain;Muscle weakness (generalized) (M62.81) Pain - Right/Left: Right Pain - part of body: Knee     Time: [id number] PT Time Calculation (min) (ACUTE ONLY): 38 min  Charges:  $  Therapeutic Activity: 38-52 mins                     Leitha Bleak, PT 08/22/22, 5:38 PM

## 2022-08-23 LAB — BASIC METABOLIC PANEL
Anion gap: 11 (ref 5–15)
BUN: 23 mg/dL — ABNORMAL HIGH (ref 6–20)
CO2: 30 mmol/L (ref 22–32)
Calcium: 9 mg/dL (ref 8.9–10.3)
Chloride: 97 mmol/L — ABNORMAL LOW (ref 98–111)
Creatinine, Ser: 0.89 mg/dL (ref 0.44–1.00)
GFR, Estimated: >60 mL/min (ref >60)
Glucose, Bld: 119 mg/dL — ABNORMAL HIGH (ref 70–99)
Potassium: 3 mmol/L — ABNORMAL LOW (ref 3.5–5.1)
Sodium: 138 mmol/L (ref 135–145)

## 2022-08-23 LAB — CBC
HCT: 39.3 % (ref 36.0–46.0)
Hemoglobin: 12.8 g/dL (ref 12.0–15.0)
MCH: 29.3 pg (ref 26.0–34.0)
MCHC: 32.6 g/dL (ref 30.0–36.0)
MCV: 89.9 fL (ref 80.0–100.0)
Platelets: 246 10*3/uL (ref 150–400)
RBC: 4.37 MIL/uL (ref 3.87–5.11)
RDW: 13.2 % (ref 11.5–15.5)
WBC: 6.9 10*3/uL (ref 4.0–10.5)
nRBC: 0 % (ref 0.0–0.2)

## 2022-08-23 LAB — MAGNESIUM: Magnesium: 2 mg/dL (ref 1.7–2.4)

## 2022-08-23 LAB — PHOSPHORUS: Phosphorus: 3.2 mg/dL (ref 2.5–4.6)

## 2022-08-23 MED ORDER — POTASSIUM CHLORIDE CRYS ER 20 MEQ PO TBCR
40.0000 meq | EXTENDED_RELEASE_TABLET | ORAL | Status: AC
  Administered 2022-08-23 (×3): 40 meq via ORAL
  Filled 2022-08-23 (×3): qty 2

## 2022-08-23 NOTE — Progress Notes (Signed)
[facility] Progress Note  Patient: Brittany Cobb    [id number]  DOA: 08/11/2022     Date of Service: the patient was seen and examined on 08/23/2022  Chief Complaint  Patient presents with   Back Pain   Brief hospital course: 52 year old with past medical history significant for depression, anxiety, hypertension, GERD, morbid obesity, history of urinary retention, who presents to the ED complaining of acute low back pain.  She slipped and fell onto her back  and had difficulty ambulating since.     She was found to have Acute appearing superior endplate compression deformity at L1 with approximately 40% height loss. No evidence of retropulsion. No evidence of an epidural hematoma.  Moderate spinal canal stenosis at L4-L5 secondary to combination of a small disc bulge and ligamentum flavum hypertrophy. Moderate right-sided neural foraminal stenosis at L4-L5.  Assessment and Plan: Principal Problem:   Inadequate pain control Active Problems:   Obesity, Class III, BMI 40-49.9 (morbid obesity) (HCC)   Essential hypertension   GERD (gastroesophageal reflux disease)   Anxiety   Grieving   Compression fracture of L1 lumbar vertebra, closed, initial encounter ([place])     # Back pain: L1 Compression Fracture:  CT lumbar spine: Acute appearing superior endplate compression deformity at L1 with approximately 40% height loss. No evidence of retropulsion. No evidence of an epidural hematoma.  Moderate spinal canal stenosis at L4-L5 secondary to combination of a small disc bulge and ligamentum flavum hypertrophy. Moderate right-sided neural foraminal stenosis at L4-L5. -neurosurgery recommend pain management and LSO brace.  -lidocaine Patch.  -Received  Toradol IV for two days. S/p Ibuprofen to Q 8 hours for 2 days. -Continue with oxycodone. Neurosurgery increase dose 2/23. -PT /OT consulted, recommend SNF< patient unable to afford SNF> and would like to go home.  -Repeated CT spine fracture  stable.  Started on Gabapentin and Zanaflex 2/23 by neurosurgery.  2/25 started calcitonin nasal spray 2/29 s/p kyphoplasty done by IR  Continue Tylenol 650 every 6 hourly scheduled, oxycodone oral as needed and Dilaudid IV as needed for breakthrough pain 3/1 Toradol 15 mg IV every 8 hourly x 2 doses ordered due to severe pain Lumbar x-ray did not show any acute findings   # Cellulitis, patient has mild arrhythmia right antiviral.  After IV insertion S/p Augmentin twice daily for 3 days   # Anxiety: Continue with trazodone, Venlafaxine  and lorazepam as needed   # GERD: Continue with PPI.  # Essential hypertension:  On 3/3 d/c'd amlodipine due to low BP 2/28 c/o worsening of lower extremity edema, increase Lasix from 20 to 40 mg p.o. twice daily and metolazone 5 mg one-time dose given. 2/29 changed Lasix 40 mg IV twice daily  # Hypokalemia secondary to diuresis, potassium repleted. Monitor electrolytes and replete as needed.  # Obesity class III BMI 49: Need lifestyle modification # Constipation: Continue  Miralax, senna. Dulcolax supp   # Bilateral Knee pain: right Knee CT negative for fracture, osteoarthritis.  Voltaren gel ordered.  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 weekly, follow with PCP to repeat vitamin D level after 3 to 6 months. # Vitamin B12 level 351, goal >400, started vitamin B12 1000 mcg p.o. daily.   # Morbid obesity, Body mass index is 49.42 kg/m.  Interventions:  Calorie restricted diet and daily exercise advised to lose body weight after recovery      Diet: Regular diet DVT Prophylaxis: Subcutaneous Lovenox   Advance goals of care discussion: Full code  Family Communication: family was present at bedside, at the time of interview.  The pt provided permission to discuss  medical plan with the family. Opportunity was given to ask question and all questions were answered satisfactorily.   Disposition:  Pt is from Home, admitted with fall and L1 fracture, still has significant pain, unable to ambulate, s/p kyphoplasty done on 2/29, which precludes a safe discharge. Discharge to TBD, when clinically stable.  Subjective: No significant events overnight, /o pain 11/10 and patient is trying her best to ambulate.  Needs more physical therapy.  Lower extremity edema is improving, patient has low potassium which will be repleted. Will plan to discharge in 1 to 2 days  Physical Exam: General: NAD, lying comfortably Appear in no distress, affect appropriate Eyes: PERRLA ENT: Oral Mucosa Clear, moist  Neck: no JVD,  Cardiovascular: S1 and S2 Present, no Murmur,  Respiratory: good respiratory effort, Bilateral Air entry equal and Decreased, no Crackles, no wheezes Abdomen: Bowel Sound present, Soft and no tenderness,  Skin: no rashes Extremities: 2+ Pedal edema, no calf tenderness Neurologic: without any new focal findings Gait not checked due to patient safety concerns  Vitals:   08/22/22 0127 08/22/22 0951 08/23/22 0012 08/23/22 0810  BP: (!) 108/56 126/62 120/69 110/60  Pulse: 72 82 90 80  Resp: '18 16 20 16  '$ Temp: 98.3 F (36.8 C) 98 F (36.7 C) 98.9 F (37.2 C) 98.5 F (36.9 C)  TempSrc:      SpO2: 92% 95% 94% 93%  Weight:      Height:        Intake/Output Summary (Last 24 hours) at 08/23/2022 1331 Last data filed at 08/23/2022 [id number] Gross per 24 hour  Intake --  Output 2500 ml  Net -2500 ml    Filed Weights   08/11/22 1218 08/20/22 0929  Weight: (!) 147.4 kg (!) 147.4 kg    Data Reviewed: I have personally reviewed and interpreted daily labs, tele strips, imagings as discussed above. I reviewed all nursing notes, pharmacy notes, vitals, pertinent old records I have discussed plan of care as described above with RN and  patient/family.  CBC: Recent Labs  Lab 08/19/22 0356 08/20/22 0728 08/21/22 0242 08/22/22 0528 08/23/22 0612  WBC 6.8 5.6 7.7 6.8 6.9  HGB 11.7* 13.3 13.1 12.7 12.8  HCT 36.3 41.1 40.0 39.9 39.3  MCV 90.5 90.5 88.9 89.5 89.9  PLT 206 243 265 251 0000000   Basic Metabolic Panel: Recent Labs  Lab 08/17/22 0322 08/18/22 0302 08/19/22 0356 08/20/22 0728 08/21/22 0242 08/22/22 0528 08/23/22 0612  NA 138 139 139 139 138 137 138  K 3.4* 3.6 3.8 3.5 3.2* 3.3* 3.0*  CL 104 104 106 99 93* 95* 97*  CO2 '27 29 26 31 '$ 33* 29 30  GLUCOSE 104* 110* 105* 110* 107* 119* 119*  BUN '17 18 17 17 18 '$ 30* 23*  CREATININE 0.69 0.88 0.71 0.75 0.78 0.90 0.89  CALCIUM 8.7* 8.5* 8.6* 9.0 9.3 9.2 9.0  MG 2.1 2.3 2.3  --   --  2.3 2.0  PHOS 4.6 4.0 4.1  --   --  4.3 3.2    Studies: No results found.  Scheduled Meds:  acetaminophen  650 mg Oral Q6H   amLODipine  5 mg Oral Daily  calcitonin (salmon)  1 spray Alternating Nares Daily   vitamin B-12  1,000 mcg Oral Daily   diclofenac Sodium  2 g Topical QID   enoxaparin (LOVENOX) injection  0.5 mg/kg Subcutaneous Q24H   furosemide  40 mg Intravenous Q12H   gabapentin  300 mg Oral TID   lamoTRIgine  200 mg Oral QHS   mometasone-formoterol  2 puff Inhalation BID   nystatin   Topical BID   pantoprazole  40 mg Oral BID   polyethylene glycol  17 g Oral BID   potassium chloride  40 mEq Oral Q4H   senna-docusate  1 tablet Oral BID   topiramate  50 mg Oral QHS   traZODone  50 mg Oral QHS   valACYclovir  1,000 mg Oral Daily   venlafaxine XR  150 mg Oral Daily   Vitamin D (Ergocalciferol)  50,000 Units Oral Q7 days   Continuous Infusions:   PRN Meds: albuterol, bisacodyl, busPIRone, HYDROmorphone (DILAUDID) injection, LORazepam, ondansetron, mouth rinse, oxyCODONE, oxyCODONE, tiZANidine  Time spent: 35 minutes  Author: Val Riles. MD [facility] 08/23/2022 1:31 PM  To reach On-call, see care teams to locate the attending and reach out to  them via [email or web address]. If 7PM-7AM, please contact night-coverage If you still have difficulty reaching the attending provider, please page the [facility] (Director on Call) for [facility] on amion for assistance.

## 2022-08-23 NOTE — Progress Notes (Signed)
Physical Therapy Treatment Patient Details Name: Brittany Cobb MRN: [id number] DOB: October 04, 1970 Today's Date: 08/23/2022   History of Present Illness Pt is a 52 year old female with history of depression, anxiety, hypertension, GERD, morbid obesity, history of urinary retention, with no history of asthma, who presents emergency department for chief concerns of new acute low back pain. Patient slipped and fell on her bottom and has had difficulty ambulating since.  Found to have L1 compression fracture. Pt underwent Kyphoplasty on 08/20/22.    PT Comments    Pt was pleasant and motivated to participate during the session and put forth good effort throughout despite high pain rating.  Pt required physical assistance with all functional tasks along with cues for general sequencing.  Pt was very limited in how much weight she could support through her RLE in standing and was only able to take several small, shuffling steps at the EOB before fatiguing and needing to return to sitting.  Pt is at an elevated risk for falls and would not be safe to return to her prior living situation at this time.  Pt will benefit from PT services in a SNF setting upon discharge to safely address deficits listed in patient problem list for decreased caregiver assistance and eventual return to PLOF.     Recommendations for follow up therapy are one component of a multi-disciplinary discharge planning process, led by the attending physician.  Recommendations may be updated based on patient status, additional functional criteria and insurance authorization.  Follow Up Recommendations  Skilled nursing-short term rehab (<3 hours/day) Can patient physically be transported by private vehicle: No   Assistance Recommended at Discharge Intermittent Supervision/Assistance  Patient can return home with the following A lot of help with walking and/or transfers;A lot of help with bathing/dressing/bathroom;Help with stairs or ramp for  entrance;Assist for transportation;Assistance with cooking/housework   Equipment Recommendations  Rolling walker (2 wheels);BSC/3in1;Wheelchair (measurements PT);Wheelchair cushion (measurements PT)    Recommendations for Other Services       Precautions / Restrictions Precautions Precautions: Knee;Back;Fall Precaution Booklet Issued: No Required Braces or Orthoses: Spinal Brace Spinal Brace: Lumbar corset Restrictions Other Position/Activity Restrictions: Lumbar corset for comfort per neurosurgery     Mobility  Bed Mobility Overal bed mobility: Needs Assistance Bed Mobility: Rolling, Sidelying to Sit, Sit to Sidelying Rolling: Supervision Sidelying to sit: Min assist     Sit to sidelying: Min assist General bed mobility comments: log roll training/review with min A during sidelying to/from sit and mod verbal and visual cues for sequencing    Transfers Overall transfer level: Needs assistance Equipment used: Rolling walker (2 wheels) Transfers: Sit to/from Stand Sit to Stand: From elevated surface, Min assist, Mod assist           General transfer comment: Mod verbal cues for sequencing    Ambulation/Gait Ambulation/Gait assistance: Min assist Gait Distance (Feet): 2 Feet Assistive device: Rolling walker (2 wheels) Gait Pattern/deviations: Step-to pattern, Trunk flexed, Shuffle Gait velocity: decreased     General Gait Details: Heavy lean on the RW for support with mostly shuffling steps at the EOB, limited WB through the RLE   Stairs             Wheelchair Mobility    Modified Rankin (Stroke Patients Only)       Balance Overall balance assessment: Needs assistance Sitting-balance support: No upper extremity supported, Feet supported Sitting balance-Leahy Scale: Good     Standing balance support: Bilateral upper extremity supported, During functional activity, Reliant  on assistive device for balance Standing balance-Leahy Scale:  Fair Standing balance comment: Heavy reliance on walker for support                            Cognition Arousal/Alertness: Awake/alert Behavior During Therapy: WFL for tasks assessed/performed Overall Cognitive Status: Within Functional Limits for tasks assessed                                          Exercises Total Joint Exercises Ankle Circles/Pumps: Strengthening, Both, 10 reps Quad Sets: Strengthening, Both, 10 reps Long Arc Quad: Strengthening, Both, 5 reps, 10 reps Knee Flexion: Strengthening, Both, 5 reps, 10 reps Other Exercises Other Exercises: Log roll training/review Other Exercises: HEP education/review for BLE APs, QS, and GS Other Exercises: Lumbar corset donning/doffing training Other Exercises: Static sitting at EOB for core therex and increased activity tolerance x 10 min    General Comments        Pertinent Vitals/Pain Pain Assessment Pain Assessment: 0-10 Pain Score: 10-Worst pain ever Pain Location: Low back and LLE, R knee Pain Descriptors / Indicators: Aching, Sore, Grimacing, Moaning Pain Intervention(s): Repositioned, Premedicated before session, Monitored during session, Limited activity within patient's tolerance    Home Living                          Prior Function            PT Goals (current goals can now be found in the care plan section) Progress towards PT goals: Progressing toward goals    Frequency    7X/week      PT Plan Current plan remains appropriate    Co-evaluation              AM-PAC PT "6 Clicks" Mobility   Outcome Measure  Help needed turning from your back to your side while in a flat bed without using bedrails?: A Little Help needed moving from lying on your back to sitting on the side of a flat bed without using bedrails?: A Little Help needed moving to and from a bed to a chair (including a wheelchair)?: A Little Help needed standing up from a chair using your  arms (e.g., wheelchair or bedside chair)?: A Little Help needed to walk in hospital room?: A Lot Help needed climbing 3-5 steps with a railing? : Total 6 Click Score: 15    End of Session Equipment Utilized During Treatment: Back brace;Gait belt Activity Tolerance: Patient limited by pain Patient left: in bed;with call bell/phone within reach;with bed alarm set Nurse Communication: Mobility status PT Visit Diagnosis: Other abnormalities of gait and mobility (R26.89);History of falling (Z91.81);Difficulty in walking, not elsewhere classified (R26.2);Pain;Muscle weakness (generalized) (M62.81) Pain - Right/Left: Right Pain - part of body: Knee (low back)     Time: 1320-1401 PT Time Calculation (min) (ACUTE ONLY): 41 min  Charges:  $Therapeutic Exercise: 8-22 mins $Therapeutic Activity: 23-37 mins                    D. Scott Maleik Vanderzee PT, DPT 08/23/22, 3:03 PM

## 2022-08-23 NOTE — Plan of Care (Signed)
  Problem: Education: Goal: Knowledge of General Education information will improve Description: Including pain rating scale, medication(s)/side effects and non-pharmacologic comfort measures Outcome: Progressing   Problem: Health Behavior/Discharge Planning: Goal: Ability to manage health-related needs will improve Outcome: Progressing   Problem: Clinical Measurements: Goal: Ability to maintain clinical measurements within normal limits will improve Outcome: Progressing Goal: Will remain free from infection Outcome: Progressing Goal: Diagnostic test results will improve Outcome: Progressing   Problem: Activity: Goal: Risk for activity intolerance will decrease Outcome: Progressing   Problem: Nutrition: Goal: Adequate nutrition will be maintained Outcome: Progressing   Problem: Coping: Goal: Level of anxiety will decrease Outcome: Progressing   Problem: Elimination: Goal: Will not experience complications related to bowel motility Outcome: Progressing Goal: Will not experience complications related to urinary retention Outcome: Progressing   Problem: Pain Managment: Goal: General experience of comfort will improve Outcome: Progressing   Problem: Safety: Goal: Ability to remain free from injury will improve Outcome: Progressing   

## 2022-08-23 NOTE — Progress Notes (Signed)
Pt BP is 110/60 and heart rate 80. Per MD hold amlodipine.

## 2022-08-24 LAB — BASIC METABOLIC PANEL
Anion gap: 7 (ref 5–15)
BUN: 23 mg/dL — ABNORMAL HIGH (ref 6–20)
CO2: 29 mmol/L (ref 22–32)
Calcium: 9 mg/dL (ref 8.9–10.3)
Chloride: 104 mmol/L (ref 98–111)
Creatinine, Ser: 0.83 mg/dL (ref 0.44–1.00)
GFR, Estimated: >60 mL/min (ref >60)
Glucose, Bld: 114 mg/dL — ABNORMAL HIGH (ref 70–99)
Potassium: 3.8 mmol/L (ref 3.5–5.1)
Sodium: 140 mmol/L (ref 135–145)

## 2022-08-24 LAB — CBC
HCT: 39 % (ref 36.0–46.0)
Hemoglobin: 12.6 g/dL (ref 12.0–15.0)
MCH: 29.3 pg (ref 26.0–34.0)
MCHC: 32.3 g/dL (ref 30.0–36.0)
MCV: 90.7 fL (ref 80.0–100.0)
Platelets: 238 10*3/uL (ref 150–400)
RBC: 4.3 MIL/uL (ref 3.87–5.11)
RDW: 13.4 % (ref 11.5–15.5)
WBC: 6.4 10*3/uL (ref 4.0–10.5)
nRBC: 0 % (ref 0.0–0.2)

## 2022-08-24 LAB — PHOSPHORUS: Phosphorus: 4.3 mg/dL (ref 2.5–4.6)

## 2022-08-24 LAB — MAGNESIUM: Magnesium: 2.1 mg/dL (ref 1.7–2.4)

## 2022-08-24 MED ORDER — KETOROLAC TROMETHAMINE 15 MG/ML IJ SOLN
15.0000 mg | Freq: Three times a day (TID) | INTRAMUSCULAR | Status: AC
  Administered 2022-08-24 (×2): 15 mg via INTRAVENOUS
  Filled 2022-08-24 (×2): qty 1

## 2022-08-24 MED ORDER — POTASSIUM CHLORIDE CRYS ER 20 MEQ PO TBCR
40.0000 meq | EXTENDED_RELEASE_TABLET | Freq: Once | ORAL | Status: AC
  Administered 2022-08-24: 40 meq via ORAL
  Filled 2022-08-24: qty 2

## 2022-08-24 NOTE — Progress Notes (Signed)
[facility] Progress Note  Patient: Brittany Cobb    [id number]  DOA: 08/11/2022     Date of Service: the patient was seen and examined on 08/24/2022  Chief Complaint  Patient presents with   Back Pain   Brief hospital course: 52 year old with past medical history significant for depression, anxiety, hypertension, GERD, morbid obesity, history of urinary retention, who presents to the ED complaining of acute low back pain.  She slipped and fell onto her back  and had difficulty ambulating since.     She was found to have Acute appearing superior endplate compression deformity at L1 with approximately 40% height loss. No evidence of retropulsion. No evidence of an epidural hematoma.  Moderate spinal canal stenosis at L4-L5 secondary to combination of a small disc bulge and ligamentum flavum hypertrophy. Moderate right-sided neural foraminal stenosis at L4-L5.  Assessment and Plan: Principal Problem:   Inadequate pain control Active Problems:   Obesity, Class III, BMI 40-49.9 (morbid obesity) (HCC)   Essential hypertension   GERD (gastroesophageal reflux disease)   Anxiety   Grieving   Compression fracture of L1 lumbar vertebra, closed, initial encounter ([place])     # Back pain: L1 Compression Fracture:  CT lumbar spine: Acute appearing superior endplate compression deformity at L1 with approximately 40% height loss. No evidence of retropulsion. No evidence of an epidural hematoma.  Moderate spinal canal stenosis at L4-L5 secondary to combination of a small disc bulge and ligamentum flavum hypertrophy. Moderate right-sided neural foraminal stenosis at L4-L5. -neurosurgery recommend pain management and LSO brace.  -lidocaine Patch.  -Received  Toradol IV for two days. S/p Ibuprofen to Q 8 hours for 2 days. -Continue with oxycodone. Neurosurgery increase dose 2/23. -PT /OT consulted, recommend SNF< patient unable to afford SNF> and would like to go home.  -Repeated CT spine fracture  stable.  Started on Gabapentin and Zanaflex 2/23 by neurosurgery.  2/25 started calcitonin nasal spray 2/29 s/p kyphoplasty done by IR  Continue Tylenol 650 every 6 hourly scheduled, oxycodone oral as needed and Dilaudid IV as needed for breakthrough pain 3/1 Toradol 15 mg IV every 8 hourly x 2 doses ordered due to severe pain Lumbar x-ray did not show any acute findings 3/4 Toradol 15 mg IV every 8 hourly x 2 doses ordered  # Cellulitis, patient has mild arrhythmia right antiviral.  After IV insertion S/p Augmentin twice daily for 3 days   # Anxiety: Continue with trazodone, Venlafaxine  and lorazepam as needed   # GERD: Continue with PPI.  # Essential hypertension:  On 3/3 d/c'd amlodipine due to low BP 2/28 c/o worsening of lower extremity edema, increase Lasix from 20 to 40 mg p.o. twice daily and metolazone 5 mg one-time dose given. 2/29 changed Lasix 40 mg IV twice daily  # Hypokalemia secondary to diuresis, potassium repleted. Monitor electrolytes and replete as needed.  # Obesity class III BMI 49: Need lifestyle modification # Constipation: Continue  Miralax, senna. Dulcolax supp   # Bilateral Knee pain: right Knee CT negative for fracture, osteoarthritis.  Voltaren gel ordered.  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 weekly, follow with PCP to repeat vitamin D level after 3 to 6 months. # Vitamin B12 level 351, goal >400, started vitamin B12 1000 mcg p.o. daily.   #  Morbid obesity, Body mass index is 49.42 kg/m.  Interventions: Calorie restricted diet and daily exercise advised to lose body weight after recovery      Diet: Regular diet DVT Prophylaxis: Subcutaneous Lovenox   Advance goals of care discussion: Full code  Family Communication: family was present at bedside, at the time of  interview.  The pt provided permission to discuss medical plan with the family. Opportunity was given to ask question and all questions were answered satisfactorily.   Disposition:  Pt is from Home, admitted with fall and L1 fracture, s/p kyphoplasty done on 2/29, still has significant pain, unable to ambulate well, which precludes a safe discharge. Discharge to home with home health PT, most likely DC in 1 to 2 days.    Subjective: No significant events overnight, c/o pain 12/10, she did try to get out of bed last night, still she is complaining of severe pain with movement, she does not have any help at home.  We will plan to discharge her in 1 to 2 days.   Physical Exam: General: NAD, lying comfortably Appear in no distress, affect appropriate Eyes: PERRLA ENT: Oral Mucosa Clear, moist  Neck: no JVD,  Cardiovascular: S1 and S2 Present, no Murmur,  Respiratory: good respiratory effort, Bilateral Air entry equal and Decreased, no Crackles, no wheezes Abdomen: Bowel Sound present, Soft and no tenderness,  Skin: no rashes Extremities: 1-2+ Pedal edema, no calf tenderness Neurologic: without any new focal findings Gait not checked due to patient safety concerns  Vitals:   08/23/22 0012 08/23/22 0810 08/24/22 0035 08/24/22 0909  BP: 120/69 110/60 105/69 115/60  Pulse: 90 80 83 69  Resp: '20 16 18 17  '$ Temp: 98.9 F (37.2 C) 98.5 F (36.9 C) 98 F (36.7 C) 97.8 F (36.6 C)  TempSrc:    Oral  SpO2: 94% 93% 94% 94%  Weight:      Height:        Intake/Output Summary (Last 24 hours) at 08/24/2022 1324 Last data filed at 08/24/2022 0910 Gross per 24 hour  Intake --  Output 1400 ml  Net -1400 ml    Filed Weights   08/11/22 1218 08/20/22 0929  Weight: (!) 147.4 kg (!) 147.4 kg    Data Reviewed: I have personally reviewed and interpreted daily labs, tele strips, imagings as discussed above. I reviewed all nursing notes, pharmacy notes, vitals, pertinent old records I have  discussed plan of care as described above with RN and patient/family.  CBC: Recent Labs  Lab 08/20/22 0728 08/21/22 0242 08/22/22 0528 08/23/22 0612 08/24/22 0525  WBC 5.6 7.7 6.8 6.9 6.4  HGB 13.3 13.1 12.7 12.8 12.6  HCT 41.1 40.0 39.9 39.3 39.0  MCV 90.5 88.9 89.5 89.9 90.7  PLT 243 265 251 246 [id number]   Basic Metabolic Panel: Recent Labs  Lab 08/18/22 0302 08/19/22 0356 08/20/22 0728 08/21/22 0242 08/22/22 0528 08/23/22 0612 08/24/22 0525  NA 139 139 139 138 137 138 140  K 3.6 3.8 3.5 3.2* 3.3* 3.0* 3.8  CL 104 106 99 93* 95* 97* 104  CO2 '29 26 31 '$ 33* '29 30 29  '$ GLUCOSE 110* 105* 110* 107* 119* 119* 114*  BUN '18 17 17 18 '$ 30* 23* 23*  CREATININE 0.88 0.71 0.75 0.78 0.90 0.89 0.83  CALCIUM 8.5* 8.6* 9.0 9.3 9.2 9.0 9.0  MG 2.3 2.3  --   --  2.3 2.0 2.1  PHOS 4.0 4.1  --   --  4.3 3.2 4.3  Studies: No results found.  Scheduled Meds:  acetaminophen  650 mg Oral Q6H   calcitonin (salmon)  1 spray Alternating Nares Daily   vitamin B-12  1,000 mcg Oral Daily   diclofenac Sodium  2 g Topical QID   enoxaparin (LOVENOX) injection  0.5 mg/kg Subcutaneous Q24H   furosemide  40 mg Intravenous Q12H   gabapentin  300 mg Oral TID   lamoTRIgine  200 mg Oral QHS   mometasone-formoterol  2 puff Inhalation BID   nystatin   Topical BID   pantoprazole  40 mg Oral BID   polyethylene glycol  17 g Oral BID   senna-docusate  1 tablet Oral BID   topiramate  50 mg Oral QHS   traZODone  50 mg Oral QHS   valACYclovir  1,000 mg Oral Daily   venlafaxine XR  150 mg Oral Daily   Vitamin D (Ergocalciferol)  50,000 Units Oral Q7 days   Continuous Infusions:   PRN Meds: albuterol, bisacodyl, busPIRone, HYDROmorphone (DILAUDID) injection, LORazepam, ondansetron, mouth rinse, oxyCODONE, oxyCODONE, tiZANidine  Time spent: 35 minutes  Author: Val Riles. MD [facility] 08/24/2022 1:24 PM  To reach On-call, see care teams to locate the attending and reach out to them via  [email or web address]. If 7PM-7AM, please contact night-coverage If you still have difficulty reaching the attending provider, please page the [facility] (Director on Call) for [facility] on amion for assistance.

## 2022-08-24 NOTE — Progress Notes (Signed)
Will need Bariatric Wheelchair due to Body Habitus Patient suffers from Compression fracture of L1 lumbar vertebra, which impairs their ability to perform daily activities like ADLS in the home.  A walking aide  will not resolve issue with performing activities of daily living. A wheelchair will allow patient to safely perform daily activities. Patient can safely propel the wheelchair in the home or has a caregiver who can provide assistance. Length of need 6 months. Accessories: elevating leg rests (ELRs), wheel locks, extensions and anti-tippers.Back cushion

## 2022-08-24 NOTE — Plan of Care (Signed)
  Problem: Safety: Goal: Ability to remain free from injury will improve Outcome: Progressing   Problem: Pain Managment: Goal: General experience of comfort will improve Outcome: Progressing   Problem: Elimination: Goal: Will not experience complications related to bowel motility Outcome: Progressing   Problem: Skin Integrity: Goal: Risk for impaired skin integrity will decrease Outcome: Progressing   Problem: Education: Goal: Knowledge of General Education information will improve Description: Including pain rating scale, medication(s)/side effects and non-pharmacologic comfort measures Outcome: Progressing   Problem: Safety: Goal: Ability to remain free from injury will improve Outcome: Progressing

## 2022-08-24 NOTE — TOC Progression Note (Addendum)
Transition of Care [facility]) - Progression Note    Patient Details  Name: Brittany Cobb MRN: [id number] Date of Birth: 1971/02/23  Transition of Care [facility]) CM/SW [place], RN Phone Number: 08/24/2022, 2:03 PM  Clinical Narrative:     Met with the patient and her friend Stanton Kidney in the room, I let her know [facility] accepted her for [facility] and they would [facility] on 3/11, she stated she needs a bariatric WC and has a ramp at home She stated she will need EMS to get home as well, I explained that EMS will not take the DME for her, she stated her friends will get it [employer] to deliver a bariatric WC to the room Expected Discharge Plan: OP Rehab Barriers to Discharge: Inadequate or no insurance  Expected Discharge Plan and Services   Discharge Planning Services: CM Consult Post Acute Care Choice: [place] arrangements for the past 2 months: Mobile Home                 DME Arranged: Gilford Rile rolling, 3-N-1 DME Agency: [employer] Date DME Agency Contacted: 08/13/22 Time DME Agency Contacted: [phone] Representative spoke with at DME Agency: Darnelle Maffucci HH Arranged: NA [place] Agency: NA Date [place]: 08/13/22 Time [place]: [id number] Representative spoke with at [place]: [place] ([place]) Interventions SDOH Screenings   Food Insecurity: No Food Insecurity (08/12/2022)  Housing: Low Risk  (08/12/2022)  Transportation Needs: No Transportation Needs (08/13/2022)  Utilities: Not At Risk (08/12/2022)  Tobacco Use: High Risk (08/20/2022)    Readmission Risk Interventions     No data to display

## 2022-08-24 NOTE — TOC Progression Note (Addendum)
Transition of Care [facility]) - Progression Note    Patient Details  Name: Brittany Cobb MRN: [id number] Date of Birth: 05/03/1971  Transition of Care [facility]) CM/SW [place], RN Phone Number: 08/24/2022, 11:27 AM  Clinical Narrative:    Reached out to [facility] Anna, [facility], [facility], [facility], [facility] to see if any of them can accept patient for [place], and [facility] both are Out of network [place] with [facility] is checking to see if they can accept her [facility] Lemmings is not able to accept her, Lajean Manes is not able to accept her, Jackquline Denmark is not able to accept her [facility] will accept her bu not able to Strt care until 3/11  Expected Discharge Plan: OP Rehab Barriers to Discharge: Inadequate or no insurance  Expected Discharge Plan and Services   Discharge Planning Services: CM Consult Post Acute Care Choice: [place] arrangements for the past 2 months: Mobile Home                 DME Arranged: Gilford Rile rolling, 3-N-1 DME Agency: [employer] Date DME Agency Contacted: 08/13/22 Time DME Agency Contacted: [phone] Representative spoke with at DME Agency: [place]: NA [place] Agency: NA Date [place]: 08/13/22 Time [place]: [id number] Representative spoke with at [place]: [place] ([place]) Interventions SDOH Screenings   Food Insecurity: No Food Insecurity (08/12/2022)  Housing: Low Risk  (08/12/2022)  Transportation Needs: No Transportation Needs (08/13/2022)  Utilities: Not At Risk (08/12/2022)  Tobacco Use: High Risk (08/20/2022)    Readmission Risk Interventions     No data to display

## 2022-08-24 NOTE — Progress Notes (Signed)
PT Cancellation Note  Patient Details Name: Brittany Cobb MRN: [id number] DOB: 05-11-1971   Cancelled Treatment:    Reason Eval/Treat Not Completed: Patient declined to participate with PT services this date secondary to [place] discharge home and trying to plan for discharge.  Pt declined offer to check back in the afternoon and stated that she would not be working with PT today despite encouragement and education on benefits of mobility.  Pt stated ok to check back on a future date.  Will attempt to see pt at a future date as medically appropriate.    Linus Salmons PT, DPT 08/24/22, 11:20 AM

## 2022-08-24 NOTE — Progress Notes (Signed)
Occupational Therapy Treatment Patient Details Name: Brittany Cobb MRN: [id number] DOB: Mar 16, 1971 Today's Date: 08/24/2022   History of present illness Pt is a 52 year old female with history of depression, anxiety, hypertension, GERD, morbid obesity, history of urinary retention, with no history of asthma, who presents emergency department for chief concerns of new acute low back pain. Patient slipped and fell on her bottom and has had difficulty ambulating since.  Found to have L1 compression fracture. Pt underwent Kyphoplasty on 08/20/22.   OT comments  [name] continues to make progress toward her self care goals.  Patient refused mobility this date due to frustration with limited support after discharge.  She was receptive to education from OT re: home safety and self care recommendations.  Session focused on compensatory strategies to support safety and independence in ADLs after discharge, including DME recommendations and strategies for various ADLs.  [name] remained frustrated re: her situation, but was receptive to education and polite to OT.  OT also provided coaching for patient to identify occupations and strategies to cope with stress, frustration, and pain.  Patient moderately receptive to this education.  [name] will continue to benefit from skilled OT services in acute setting to support functional strengthening, safety, and independence in ADLs.  Discharge recommendations remain appropriate.   Recommendations for follow up therapy are one component of a multi-disciplinary discharge planning process, led by the attending physician.  Recommendations may be updated based on patient status, additional functional criteria and insurance authorization.    Follow Up Recommendations  Skilled nursing-short term rehab (<3 hours/day)     Assistance Recommended at Discharge Intermittent Supervision/Assistance  Patient can return home with the following  A lot of help with walking and/or  transfers;A lot of help with bathing/dressing/bathroom;Help with stairs or ramp for entrance;Assistance with cooking/housework;Assist for transportation   Equipment Recommendations  BSC/3in1;Wheelchair (measurements OT)    Recommendations for Other Services      Precautions / Restrictions Precautions Precautions: Knee;Back;Fall Precaution Comments: no twisting/turning at trunk Required Braces or Orthoses: Spinal Brace Spinal Brace: Lumbar corset Restrictions Weight Bearing Restrictions: No Other Position/Activity Restrictions: Lumbar corset for comfort per neurosurgery       Mobility Bed Mobility Overal bed mobility: Needs Assistance               Patient Response: Cooperative  Transfers                         Balance Overall balance assessment: Needs assistance                                         ADL either performed or assessed with clinical judgement   ADL Overall ADL's : Needs assistance/impaired Eating/Feeding: Set up;Sitting   Grooming: Sitting;Wash/dry face;Set up;Supervision/safety   Upper Body Bathing: Supervision/ safety;Sitting                                  Extremity/Trunk Assessment Upper Extremity Assessment Upper Extremity Assessment: Overall WFL for tasks assessed       Cervical / Trunk Assessment Cervical / Trunk Assessment: Normal;Back Surgery    Vision Patient Visual Report: No change from baseline     Perception     Praxis      Cognition Arousal/Alertness: Awake/alert Behavior During Therapy: [facility]  for tasks assessed/performed Overall Cognitive Status: Within Functional Limits for tasks assessed                                          Exercises Other Exercises Other Exercises: provided education re: DME recommendations, strategies to promote safety and independence in self care at home    Shoulder Instructions       General Comments      Pertinent  Vitals/ Pain       Pain Assessment Pain Assessment: Faces Faces Pain Scale: Hurts even more Pain Location: Low back and LLE, R knee Pain Intervention(s): Limited activity within patient's tolerance, Monitored during session  Home Living                                          Prior Functioning/Environment              Frequency  Min 2X/week        Progress Toward Goals  OT Goals(current goals can now be found in the care plan section)  Progress towards OT goals: Progressing toward goals  Acute Rehab OT Goals Patient Stated Goal: to increase support at discharge OT Goal Formulation: With patient Time For Goal Achievement: 09/04/22 Potential to Achieve Goals: Good  Plan Discharge plan remains appropriate;Frequency remains appropriate    Co-evaluation                 AM-PAC OT "6 Clicks" Daily Activity     Outcome Measure   Help from another person eating meals?: None Help from another person taking care of personal grooming?: A Little Help from another person toileting, which includes using toliet, bedpan, or urinal?: A Little Help from another person bathing (including washing, rinsing, drying)?: A Lot Help from another person to put on and taking off regular upper body clothing?: A Little Help from another person to put on and taking off regular lower body clothing?: A Lot 6 Click Score: 17    End of Session    OT Visit Diagnosis: Other abnormalities of gait and mobility (R26.89);Muscle weakness (generalized) (M62.81)   Activity Tolerance Patient tolerated treatment well   Patient Left     Nurse Communication          Time: [id number] OT Time Calculation (min): 14 min  Charges: OT General Charges $OT Visit: 1 Visit OT Treatments $Self Care/Home Management : 8-22 mins  Jeneen Montgomery, OTR/L 08/24/22, 12:28 PM

## 2022-08-25 LAB — BASIC METABOLIC PANEL
Anion gap: 7 (ref 5–15)
BUN: 27 mg/dL — ABNORMAL HIGH (ref 6–20)
CO2: 28 mmol/L (ref 22–32)
Calcium: 9 mg/dL (ref 8.9–10.3)
Chloride: 103 mmol/L (ref 98–111)
Creatinine, Ser: 1.09 mg/dL — ABNORMAL HIGH (ref 0.44–1.00)
GFR, Estimated: >60 mL/min (ref >60)
Glucose, Bld: 114 mg/dL — ABNORMAL HIGH (ref 70–99)
Potassium: 3.6 mmol/L (ref 3.5–5.1)
Sodium: 138 mmol/L (ref 135–145)

## 2022-08-25 LAB — CBC
HCT: 38.7 % (ref 36.0–46.0)
Hemoglobin: 12.4 g/dL (ref 12.0–15.0)
MCH: 29.2 pg (ref 26.0–34.0)
MCHC: 32 g/dL (ref 30.0–36.0)
MCV: 91.1 fL (ref 80.0–100.0)
Platelets: 245 10*3/uL (ref 150–400)
RBC: 4.25 MIL/uL (ref 3.87–5.11)
RDW: 13.3 % (ref 11.5–15.5)
WBC: 7.2 10*3/uL (ref 4.0–10.5)
nRBC: 0 % (ref 0.0–0.2)

## 2022-08-25 MED ORDER — FUROSEMIDE 10 MG/ML IJ SOLN
40.0000 mg | Freq: Every day | INTRAMUSCULAR | Status: DC
  Administered 2022-08-26: 40 mg via INTRAVENOUS
  Filled 2022-08-25: qty 4

## 2022-08-25 NOTE — Plan of Care (Signed)
  Problem: Education: Goal: Knowledge of General Education information will improve Description: Including pain rating scale, medication(s)/side effects and non-pharmacologic comfort measures Outcome: Progressing   Problem: Elimination: Goal: Will not experience complications related to bowel motility Outcome: Progressing   Problem: Nutrition: Goal: Adequate nutrition will be maintained Outcome: Progressing   Problem: Skin Integrity: Goal: Risk for impaired skin integrity will decrease Outcome: Progressing   Problem: Safety: Goal: Ability to remain free from injury will improve Outcome: Progressing   Problem: Pain Managment: Goal: General experience of comfort will improve Outcome: Progressing

## 2022-08-25 NOTE — Progress Notes (Signed)
Occupational Therapy Treatment Patient Details Name: Brittany Cobb MRN: [id number] DOB: [birth year] Today's Date: 08/25/2022   History of present illness Pt is a 52 year old female with history of depression, anxiety, hypertension, GERD, morbid obesity, history of urinary retention, with no history of asthma, who presents emergency department for chief concerns of new acute low back pain. Patient slipped and fell on her bottom and has had difficulty ambulating since.  Found to have L1 compression fracture. Pt underwent Kyphoplasty on 08/20/22.   OT comments  Ms. Sancen was seen for OT/PT co-treatment on this date. Upon arrival to room pt with PTA in room to begin session. Pt voices interest in AE for LB ADL management at start of session. Pt continues to be functionally limited by decreased LB access, increased pain with mobility, and ADL management in setting of back precautions. OT/PTA facilitated bed/functional mobility as described below. Once seated in recliner pt is able to perform LB ADL management with supervision and min cueing for safe use of AE for ADL management. Pt able to doff/don hospital socks with supervision and good adherence to back precautions. She initially requires +2 MIN A for STS, but is able to progress to +1 assist for functional mobility during session. She is able to don lumbar corset with SET UP assist.  Pt making good progress toward goals and continues to benefit from skilled OT services to maximize return to PLOF and minimize risk of future falls, injury, caregiver burden, and readmission. Will continue to follow POC. Discharge recommendation remains appropriate.     Recommendations for follow up therapy are one component of a multi-disciplinary discharge planning process, led by the attending physician.  Recommendations may be updated based on patient status, additional functional criteria and insurance authorization.    Follow Up Recommendations  Skilled nursing-short  term rehab (<3 hours/day)     Assistance Recommended at Discharge Intermittent Supervision/Assistance  Patient can return home with the following  A lot of help with walking and/or transfers;A lot of help with bathing/dressing/bathroom;Help with stairs or ramp for entrance;Assistance with cooking/housework;Assist for transportation   Equipment Recommendations  BSC/3in1;Wheelchair (measurements OT)    Recommendations for Other Services      Precautions / Restrictions Precautions Precautions: Back;Fall Precaution Booklet Issued: No Precaution Comments: spinal precautions Required Braces or Orthoses: Spinal Brace Spinal Brace:  (LSO) Restrictions Weight Bearing Restrictions: No Other Position/Activity Restrictions: Lumbar corset for comfort per neurosurgery       Mobility Bed Mobility Overal bed mobility: Needs Assistance Bed Mobility: Supine to Sit     Supine to sit: Supervision, HOB elevated          Transfers Overall transfer level: Needs assistance Equipment used: Rolling walker (2 wheels) Transfers: Sit to/from Stand Sit to Stand: Min assist, +2 physical assistance, From elevated surface     Step pivot transfers: From elevated surface, Supervision     General transfer comment: Initial +2 min A to STS from EOB at slightly elevated height. Able to progress to supervision with max multimodal cueing for hand/foot placement and reassurance from therapists.     Balance Overall balance assessment: Needs assistance Sitting-balance support: No upper extremity supported, Feet supported Sitting balance-Leahy Scale: Good Sitting balance - Comments: steady sitting reaching within BOS and within spinal precautions   Standing balance support: Bilateral upper extremity supported, During functional activity, Reliant on assistive device for balance Standing balance-Leahy Scale: Fair Standing balance comment: Heavy reliance on walker for support  ADL either performed or assessed with clinical judgement   ADL Overall ADL's : Needs assistance/impaired                     Lower Body Dressing: Sit to/from stand;Moderate assistance;With adaptive equipment Lower Body Dressing Details (indicate cue type and reason): Supervision to doff/don hospital socks with min cueing for use of LH reacher and sock aid. Toilet Transfer: Rolling walker (2 wheels);Cueing for sequencing;Cueing for safety;Minimal assistance             General ADL Comments: Pt remains functionally limited by increased pain in low back, LLE and RLE. She requires +2 MIN A for STS, SUPERIVISION to Step pivot to room recliner.    Extremity/Trunk Assessment              Vision Patient Visual Report: No change from baseline     Perception     Praxis      Cognition Arousal/Alertness: Awake/alert Behavior During Therapy: WFL for tasks assessed/performed Overall Cognitive Status: Within Functional Limits for tasks assessed                                 General Comments: pt is very anxious. Anxiety limits session more so than physical limitations        Exercises Other Exercises Other Exercises: provided education re: DME recommendations, strategies to promote safety and independence in self care at home. OT facilitated functional asks including LB dressing as described in ADL section.    Shoulder Instructions       General Comments Discussed at length [place] home 08/26/22. pt voices anxiety and fear but is planning to have help lined up for DC by EMS    Pertinent Vitals/ Pain       Pain Assessment Faces Pain Scale: Hurts whole lot Pain Location: Low back and LLE, R knee Pain Descriptors / Indicators: Aching, Sore, Grimacing, Moaning Pain Intervention(s): Limited activity within patient's tolerance, Monitored during session, Repositioned, Utilized relaxation techniques, Premedicated before session  Home Living                                           Prior Functioning/Environment              Frequency  Min 2X/week        Progress Toward Goals  OT Goals(current goals can now be found in the care plan section)  Progress towards OT goals: Progressing toward goals  Acute Rehab OT Goals Patient Stated Goal: to increase support at discharge OT Goal Formulation: With patient Time For Goal Achievement: 09/04/22 Potential to Achieve Goals: Good  Plan Discharge plan remains appropriate;Frequency remains appropriate    Co-evaluation    PT/OT/SLP Co-Evaluation/Treatment: Yes Reason for Co-Treatment: To address functional/ADL transfers;For patient/therapist safety PT goals addressed during session: Mobility/safety with mobility;Balance;Strengthening/ROM OT goals addressed during session: ADL's and self-care      AM-PAC OT "6 Clicks" Daily Activity     Outcome Measure   Help from another person eating meals?: None Help from another person taking care of personal grooming?: A Little Help from another person toileting, which includes using toliet, bedpan, or urinal?: A Little Help from another person bathing (including washing, rinsing, drying)?: A Little Help from another person to put on and taking off regular upper body clothing?:  A Little Help from another person to put on and taking off regular lower body clothing?: A Little 6 Click Score: 19    End of Session Equipment Utilized During Treatment: Rolling walker (2 wheels)  OT Visit Diagnosis: Other abnormalities of gait and mobility (R26.89);Muscle weakness (generalized) (M62.81)   Activity Tolerance Patient tolerated treatment well   Patient Left in chair;with call bell/phone within reach;with chair alarm set   Nurse Communication          Time: [id number] OT Time Calculation (min): 30 min  Charges: OT General Charges $OT Visit: 1 Visit OT Treatments $Self Care/Home Management : 8-22 mins  Shara Blazing, M.S., OTR/L 08/25/22, 3:06 PM

## 2022-08-25 NOTE — Progress Notes (Signed)
Physical Therapy Treatment Patient Details Name: Brittany Cobb MRN: [id number] DOB: [birth year] Today's Date: 08/25/2022   History of Present Illness Pt is a 52 year old female with history of depression, anxiety, hypertension, GERD, morbid obesity, history of urinary retention, with no history of asthma, who presents emergency department for chief concerns of new acute low back pain. Patient slipped and fell on her bottom and has had difficulty ambulating since.  Found to have L1 compression fracture. Pt underwent Kyphoplasty on 08/20/22.    PT Comments    Pt was still sitting in recliner from earlier OT/PTA session. She does endorse wanting to return to bed. " Do you think I can get a hospital bed before I DC home tomorrow?" Author reports he will reach out to [facility] about her request. She does however endorse having all other equipment needs met. Pt stood from recliner to Vivian with min assist +1. Pt presents with severe anxiety and needs constant vcs for relaxation while she is in standing/performing dynamic task. Pt easily took a few shuffling steps from recliner to EOB. Encouraged improved lateral wt shift for opposite LE advancement/ foot clearance. Unable to successfully improve due to pt requesting to sit. Overall pt is limited by pain/anxiety. SNF remains most appropriate DC disposition however pt is unwilling. Pt is planning to DC home tomorrow with friends help. Throughout this session, pt's friend present and endorses willingness to assist pt at DC. If pt does DC home, recommend HHPT to follow to maximize independence and safety with all ADLs.    Recommendations for follow up therapy are one component of a multi-disciplinary discharge planning process, led by the attending physician.  Recommendations may be updated based on patient status, additional functional criteria and insurance authorization.  Follow Up Recommendations  Skilled nursing-short term rehab (<3 hours/day) (pt is planning to DC   home tomorrow. Supportive friend in room endorses being available to help her at DC.) Can patient physically be transported by private vehicle: No   Assistance Recommended at Discharge Intermittent Supervision/Assistance  Patient can return home with the following A little help with walking and/or transfers;A little help with bathing/dressing/bathroom;Assistance with cooking/housework;Assist for transportation;Help with stairs or ramp for entrance;Direct supervision/assist for medications management;Direct supervision/assist for financial management   Equipment Recommendations  Hospital bed (pt requesting hospital bed. All other equipment needs have been met)       Precautions / Restrictions Precautions Precautions: Back;Fall Precaution Booklet Issued: No Precaution Comments: spinal precautions Required Braces or Orthoses: Spinal Brace Spinal Brace:  (LSO) Restrictions Weight Bearing Restrictions: No     Mobility  Bed Mobility  General bed mobility comments: pt was sitting EOB taking medications wih RN present at conclusion of session. She was sitting in recliner pre session.   Transfers Overall transfer level: Needs assistance Equipment used: Rolling walker (2 wheels) Transfers: Sit to/from Stand Sit to Stand: Min assist    General transfer comment: pt required min assist to stand from recliner with vcs for handplacement, fwd wt shift, and overall technique improvements. pt is very fearful. Author questions if anxiety/fear is limiting more so than pain/ strength, or physical limtations    Ambulation/Gait Ambulation/Gait assistance: Min guard Gait Distance (Feet): 5 Feet Assistive device: Rolling walker (2 wheels) Gait Pattern/deviations: Step-to pattern, Antalgic, Shuffle Gait velocity: decreased     General Gait Details: pt was able t take ~ 5 shuffling steps from recliner to EOB. No LOB however constant vcs for encouragement and relaxation techniques    Balance Overall  balance assessment: Needs assistance Sitting-balance support: No upper extremity supported, Feet supported Sitting balance-Leahy Scale: Good     Standing balance support: Bilateral upper extremity supported, During functional activity, Reliant on assistive device for balance Standing balance-Leahy Scale: Fair    Cognition Arousal/Alertness: Awake/alert Behavior During Therapy: WFL for tasks assessed/performed Overall Cognitive Status: Within Functional Limits for tasks assessed      General Comments: pt is very anxious. Anxiety limits session more so than physical limitations           General Comments General comments (skin integrity, edema, etc.): Discussed at length [place] home 08/26/22. pt voices anxiety and fear but is planning to have help lined up for DC by EMS      Pertinent Vitals/Pain Pain Assessment Pain Assessment: 0-10 Pain Score: 6  Faces Pain Scale: Hurts whole lot Pain Location: Low back and LLE, R knee Pain Descriptors / Indicators: Aching, Sore, Grimacing, Moaning Pain Intervention(s): Limited activity within patient's tolerance, Monitored during session, Premedicated before session, Repositioned     PT Goals (current goals can now be found in the care plan section) Acute Rehab PT Goals Patient Stated Goal: return home tomorrow with my "guy friend" helping me Progress towards PT goals: Progressing toward goals    Frequency    7X/week      PT Plan Current plan remains appropriate    Co-evaluation     PT goals addressed during session: Mobility/safety with mobility;Balance;Strengthening/ROM        AM-PAC PT "6 Clicks" Mobility   Outcome Measure  Help needed turning from your back to your side while in a flat bed without using bedrails?: A Little Help needed moving from lying on your back to sitting on the side of a flat bed without using bedrails?: A Little Help needed moving to and from a bed to a chair (including a wheelchair)?: A  Little Help needed standing up from a chair using your arms (e.g., wheelchair or bedside chair)?: A Little Help needed to walk in hospital room?: A Little Help needed climbing 3-5 steps with a railing? : A Lot 6 Click Score: 17    End of Session Equipment Utilized During Treatment: Back brace;Gait belt Activity Tolerance: Patient tolerated treatment well (limited by anxiety/fear of falling) Patient left: in bed;with call bell/phone within reach;with bed alarm set Nurse Communication: Mobility status PT Visit Diagnosis: Other abnormalities of gait and mobility (R26.89);History of falling (Z91.81);Difficulty in walking, not elsewhere classified (R26.2);Pain;Muscle weakness (generalized) (M62.81) Pain - Right/Left: Right Pain - part of body: Knee (LBP)     Time: [id number] PT Time Calculation (min) (ACUTE ONLY): 19 min  Charges:  $Therapeutic Activity: 8-22 mins                     Julaine Fusi PTA 08/25/22, 2:47 PM

## 2022-08-25 NOTE — Progress Notes (Signed)
[facility] Progress Note  Patient: Brittany Cobb    [id number]  DOA: 08/11/2022     Date of Service: the patient was seen and examined on 08/25/2022  Chief Complaint  Patient presents with   Back Pain   Brief hospital course: 52 year old with past medical history significant for depression, anxiety, hypertension, GERD, morbid obesity, history of urinary retention, who presents to the ED complaining of acute low back pain.  She slipped and fell onto her back  and had difficulty ambulating since.     She was found to have Acute appearing superior endplate compression deformity at L1 with approximately 40% height loss. No evidence of retropulsion. No evidence of an epidural hematoma.  Moderate spinal canal stenosis at L4-L5 secondary to combination of a small disc bulge and ligamentum flavum hypertrophy. Moderate right-sided neural foraminal stenosis at L4-L5.  Assessment and Plan: Principal Problem:   Inadequate pain control Active Problems:   Obesity, Class III, BMI 40-49.9 (morbid obesity) (HCC)   Essential hypertension   GERD (gastroesophageal reflux disease)   Anxiety   Grieving   Compression fracture of L1 lumbar vertebra, closed, initial encounter ([place])     # Back pain: L1 Compression Fracture:  CT lumbar spine: Acute appearing superior endplate compression deformity at L1 with approximately 40% height loss. No evidence of retropulsion. No evidence of an epidural hematoma.  Moderate spinal canal stenosis at L4-L5 secondary to combination of a small disc bulge and ligamentum flavum hypertrophy. Moderate right-sided neural foraminal stenosis at L4-L5. -neurosurgery recommend pain management and LSO brace.  -lidocaine Patch.  -Received  Toradol IV for two days. S/p Ibuprofen to Q 8 hours for 2 days. -Continue with oxycodone. Neurosurgery increase dose 2/23. -PT /OT consulted, recommend SNF< patient unable to afford SNF> and would like to go home.  -Repeated CT spine fracture  stable.  Started on Gabapentin and Zanaflex 2/23 by neurosurgery.  2/25 started calcitonin nasal spray 2/29 s/p kyphoplasty done by IR  Continue Tylenol 650 every 6 hourly scheduled, oxycodone oral as needed and Dilaudid IV as needed for breakthrough pain 3/1 Toradol 15 mg IV every 8 hourly x 2 doses ordered due to severe pain Lumbar x-ray did not show any acute findings 3/4 Toradol 15 mg IV every 8 hourly x 2 doses ordered   # Cellulitis, patient has mild arrhythmia right antiviral.  After IV insertion S/p Augmentin twice daily for 3 days   # Anxiety: Continue with trazodone, Venlafaxine  and lorazepam as needed   # GERD: Continue with PPI.  # Essential hypertension:  On 3/3 d/c'd amlodipine due to low BP 2/28 c/o worsening of lower extremity edema, increase Lasix from 20 to 40 mg p.o. twice daily and metolazone 5 mg one-time dose given. 2/29 changed Lasix 40 mg IV twice daily 3/5 decrease IV Lasix 40 daily due to slightly elevated creatinine  # Hypokalemia secondary to diuresis, potassium repleted. Monitor electrolytes and replete as needed.  # Obesity class III BMI 49: Need lifestyle modification # Constipation: Continue  Miralax, senna. Dulcolax supp   # Bilateral Knee pain: right Knee CT negative for fracture, osteoarthritis.  Voltaren gel ordered.  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 weekly, follow with PCP to repeat vitamin D level after 3 to 6 months. # Vitamin B12 level  351, goal >400, started vitamin B12 1000 mcg p.o. daily.   # Morbid obesity, Body mass index is 49.42 kg/m.  Interventions: Calorie restricted diet and daily exercise advised to lose body weight after recovery      Diet: Regular diet DVT Prophylaxis: Subcutaneous Lovenox   Advance goals of care discussion: Full  code  Family Communication: family was present at bedside, at the time of interview.  The pt provided permission to discuss medical plan with the family. Opportunity was given to ask question and all questions were answered satisfactorily.   Disposition:  Pt is from Home, admitted with fall and L1 fracture, s/p kyphoplasty done on 2/29, still has significant pain, unable to ambulate well, which precludes a safe discharge. Discharge to home with home health PT, most likely tomorrow a.m.   Subjective: No significant events overnight, pain in the back is still too much, patient is not putting more pressure on the right leg but more pressure on the left leg, patient was able to make to the bathroom but with support, so we will keep her today and plan for disposition tomorrow a.m.   Physical Exam: General: NAD, lying comfortably Appear in no distress, affect appropriate Eyes: PERRLA ENT: Oral Mucosa Clear, moist  Neck: no JVD,  Cardiovascular: S1 and S2 Present, no Murmur,  Respiratory: good respiratory effort, Bilateral Air entry equal and Decreased, no Crackles, no wheezes Abdomen: Bowel Sound present, Soft and no tenderness,  Skin: no rashes Extremities: 1-2+ Pedal edema, no calf tenderness Neurologic: without any new focal findings Gait not checked due to patient safety concerns  Vitals:   08/24/22 0909 08/24/22 1617 08/25/22 0045 08/25/22 0835  BP: 115/60 (!) 110/55 125/67 124/84  Pulse: 69 73 78 70  Resp: '17 18 18 16  '$ Temp: 97.8 F (36.6 C) 97.6 F (36.4 C) 97.8 F (36.6 C) 98.3 F (36.8 C)  TempSrc: Oral Oral    SpO2: 94% 97% 94% 96%  Weight:      Height:        Intake/Output Summary (Last 24 hours) at 08/25/2022 1423 Last data filed at 08/25/2022 1057 Gross per 24 hour  Intake 100 ml  Output 200 ml  Net -100 ml    Filed Weights   08/11/22 1218 08/20/22 0929  Weight: (!) 147.4 kg (!) 147.4 kg    Data Reviewed: I have personally reviewed and interpreted daily  labs, tele strips, imagings as discussed above. I reviewed all nursing notes, pharmacy notes, vitals, pertinent old records I have discussed plan of care as described above with RN and patient/family.  CBC: Recent Labs  Lab 08/21/22 0242 08/22/22 0528 08/23/22 0612 08/24/22 0525 08/25/22 0349  WBC 7.7 6.8 6.9 6.4 7.2  HGB 13.1 12.7 12.8 12.6 12.4  HCT 40.0 39.9 39.3 39.0 38.7  MCV 88.9 89.5 89.9 90.7 91.1  PLT 265 251 246 238 [id number]   Basic Metabolic Panel: Recent Labs  Lab 08/19/22 0356 08/20/22 0728 08/21/22 0242 08/22/22 0528 08/23/22 0612 08/24/22 0525 08/25/22 0349  NA 139   < > 138 137 138 140 138  K 3.8   < > 3.2* 3.3* 3.0* 3.8 3.6  CL 106   < > 93* 95* 97* 104 103  CO2 26   < > 33* '29 30 29 28  '$ GLUCOSE 105*   < > 107* 119* 119* 114* 114*  BUN 17   < > 18 30* 23* 23* 27*  CREATININE 0.71   < > 0.78 0.90 0.89 0.83  1.09*  CALCIUM 8.6*   < > 9.3 9.2 9.0 9.0 9.0  MG 2.3  --   --  2.3 2.0 2.1  --   PHOS 4.1  --   --  4.3 3.2 4.3  --    < > = values in this interval not displayed.    Studies: No results found.  Scheduled Meds:  acetaminophen  650 mg Oral Q6H   calcitonin (salmon)  1 spray Alternating Nares Daily   vitamin B-12  1,000 mcg Oral Daily   diclofenac Sodium  2 g Topical QID   enoxaparin (LOVENOX) injection  0.5 mg/kg Subcutaneous Q24H   furosemide  40 mg Intravenous Q12H   gabapentin  300 mg Oral TID   lamoTRIgine  200 mg Oral QHS   mometasone-formoterol  2 puff Inhalation BID   nystatin   Topical BID   pantoprazole  40 mg Oral BID   polyethylene glycol  17 g Oral BID   senna-docusate  1 tablet Oral BID   topiramate  50 mg Oral QHS   traZODone  50 mg Oral QHS   valACYclovir  1,000 mg Oral Daily   venlafaxine XR  150 mg Oral Daily   Vitamin D (Ergocalciferol)  50,000 Units Oral Q7 days   Continuous Infusions:   PRN Meds: albuterol, bisacodyl, busPIRone, LORazepam, ondansetron, mouth rinse, oxyCODONE, oxyCODONE, tiZANidine  Time spent: 35  minutes  Author: Val Riles. MD [facility] 08/25/2022 2:23 PM  To reach On-call, see care teams to locate the attending and reach out to them via [email or web address]. If 7PM-7AM, please contact night-coverage If you still have difficulty reaching the attending provider, please page the [facility] (Director on Call) for [facility] on amion for assistance.

## 2022-08-26 ENCOUNTER — Other Ambulatory Visit: Payer: Self-pay

## 2022-08-26 LAB — BASIC METABOLIC PANEL
Anion gap: 6 (ref 5–15)
BUN: 24 mg/dL — ABNORMAL HIGH (ref 6–20)
CO2: 28 mmol/L (ref 22–32)
Calcium: 8.9 mg/dL (ref 8.9–10.3)
Chloride: 106 mmol/L (ref 98–111)
Creatinine, Ser: 0.76 mg/dL (ref 0.44–1.00)
GFR, Estimated: >60 mL/min (ref >60)
Glucose, Bld: 110 mg/dL — ABNORMAL HIGH (ref 70–99)
Potassium: 3.5 mmol/L (ref 3.5–5.1)
Sodium: 140 mmol/L (ref 135–145)

## 2022-08-26 LAB — CBC
HCT: 37.4 % (ref 36.0–46.0)
Hemoglobin: 12.2 g/dL (ref 12.0–15.0)
MCH: 29.6 pg (ref 26.0–34.0)
MCHC: 32.6 g/dL (ref 30.0–36.0)
MCV: 90.8 fL (ref 80.0–100.0)
Platelets: 236 10*3/uL (ref 150–400)
RBC: 4.12 MIL/uL (ref 3.87–5.11)
RDW: 13.2 % (ref 11.5–15.5)
WBC: 6.4 10*3/uL (ref 4.0–10.5)
nRBC: 0 % (ref 0.0–0.2)

## 2022-08-26 MED ORDER — POTASSIUM CHLORIDE CRYS ER 20 MEQ PO TBCR
40.0000 meq | EXTENDED_RELEASE_TABLET | Freq: Once | ORAL | Status: AC
  Administered 2022-08-26: 40 meq via ORAL
  Filled 2022-08-26: qty 2

## 2022-08-26 MED ORDER — CYANOCOBALAMIN 1000 MCG PO TABS
1000.0000 ug | ORAL_TABLET | Freq: Every day | ORAL | 0 refills | Status: AC
  Filled 2022-08-26: qty 90, 90d supply, fill #0

## 2022-08-26 MED ORDER — VITAMIN D (ERGOCALCIFEROL) 1.25 MG (50000 UNIT) PO CAPS
50000.0000 [IU] | ORAL_CAPSULE | ORAL | 0 refills | Status: AC
  Filled 2022-08-26: qty 12, 84d supply, fill #0

## 2022-08-26 MED ORDER — POTASSIUM CHLORIDE CRYS ER 20 MEQ PO TBCR
20.0000 meq | EXTENDED_RELEASE_TABLET | Freq: Every day | ORAL | 2 refills | Status: AC
  Filled 2022-08-26: qty 30, 30d supply, fill #0

## 2022-08-26 MED ORDER — POLYETHYLENE GLYCOL 3350 17 G PO PACK
17.0000 g | PACK | Freq: Every day | ORAL | 0 refills | Status: AC
  Filled 2022-08-26: qty 30, 30d supply, fill #0

## 2022-08-26 MED ORDER — BISACODYL 5 MG PO TBEC
10.0000 mg | DELAYED_RELEASE_TABLET | Freq: Every day | ORAL | 0 refills | Status: DC | PRN
  Filled 2022-08-26: qty 30, 15d supply, fill #0

## 2022-08-26 MED ORDER — HYDROCODONE-ACETAMINOPHEN 10-325 MG PO TABS
1.0000 | ORAL_TABLET | Freq: Three times a day (TID) | ORAL | 0 refills | Status: AC | PRN
  Filled 2022-08-26: qty 30, 10d supply, fill #0

## 2022-08-26 MED ORDER — ACETAMINOPHEN 325 MG PO TABS
650.0000 mg | ORAL_TABLET | Freq: Three times a day (TID) | ORAL | 0 refills | Status: AC | PRN
  Filled 2022-08-26: qty 100, 17d supply, fill #0

## 2022-08-26 MED ORDER — TIZANIDINE HCL 4 MG PO TABS
4.0000 mg | ORAL_TABLET | Freq: Three times a day (TID) | ORAL | 0 refills | Status: DC | PRN
  Filled 2022-08-26: qty 30, 10d supply, fill #0

## 2022-08-26 MED ORDER — FUROSEMIDE 40 MG PO TABS
40.0000 mg | ORAL_TABLET | Freq: Every day | ORAL | 2 refills | Status: AC
  Filled 2022-08-26: qty 30, 30d supply, fill #0

## 2022-08-26 NOTE — Progress Notes (Cosign Needed)
Patient has Compression fracture of L1 lumbar vertebra,  which requires whole body to be positioned in ways not feasible with a normal bed. Marland Kitchen  Spinal fracture frequently requires frequent changes in body position which cannot be achieved with a normal bed.

## 2022-08-26 NOTE — Discharge Summary (Signed)
[facility] Discharge Summary   Patient: Brittany Cobb [id number]  PCP: Marzetta Board, DO  Date of admission: 08/11/2022   Date of discharge:  08/26/2022     Discharge Diagnoses:  Principal Problem:   Inadequate pain control Active Problems:   Obesity, Class III, BMI 40-49.9 (morbid obesity) (HCC)   Essential hypertension   GERD (gastroesophageal reflux disease)   Anxiety   Grieving   Compression fracture of L1 lumbar vertebra, closed, initial encounter ([place])   Admitted From: Home Disposition:  Home with HHPT  Recommendations for Outpatient Follow-up:  F/u with PCP in 1-2 wks Follow-up with neurosurgery in 1 to 2 weeks if pain does not improve or get worse. Follow up LABS/TEST:     Follow-up Information     El-Abd, Joesph Fillers, MD Follow up in 2 week(s).   Specialties: Interventional Radiology, Diagnostic Radiology, Radiology Why: Our office will call you to schedule a phone call follow-up appointment. Contact information: [address] [place]  [address] [phone]                Diet recommendation: Cardiac diet  Activity: The patient is advised to gradually reintroduce usual activities, as tolerated  Discharge Condition: stable  Code Status: Full code   History of present illness: As per the H and P dictated on admission Hospital Course:  52 year old with past medical history significant for depression, anxiety, hypertension, GERD, morbid obesity, history of urinary retention, who presents to the ED complaining of acute low back pain.  She slipped and fell onto her back  and had difficulty ambulating since.     She was found to have Acute appearing superior endplate compression deformity at L1 with approximately 40% height loss. No evidence of retropulsion. No evidence of an epidural hematoma.  Moderate spinal canal stenosis at L4-L5 secondary to combination of a small disc bulge and ligamentum flavum hypertrophy. Moderate right-sided  neural foraminal stenosis at L4-L5.   Assessment and Plan:  # Back pain: L1 Compression Fracture:  CT lumbar spine: Acute appearing superior endplate compression deformity at L1 with approximately 40% height loss. No evidence of retropulsion. No evidence of an epidural hematoma.  Moderate spinal canal stenosis at L4-L5 secondary to combination of a small disc bulge and ligamentum flavum hypertrophy. Moderate right-sided neural foraminal stenosis at L4-L5. Neurosurgery was consulted, recommended kyphoplasty but initially patient declined and then agreed so we consulted IR.  Neurosurgery also recommended pain management and LSO brace.  Patient was given lidocaine patch, gabapentin and Zanaflex.  Also received IV Toradol and calcitonin nasal spray.  Patient was discharged on oxycodone and Tylenol as needed.  Patient was advised to follow with PCP and neurosurgery in 1 week as an outpatient. # Cellulitis, patient has mild arrhythmia right antiviral.  After IV insertion S/p Augmentin twice daily for 3 days # Anxiety: Continue with trazodone, Venlafaxine  and lorazepam as needed # GERD: Continue with PPI. # Essential hypertension: Patient had an episode of hypotension, so amlodipine was discontinued.  Due to worsening of edema patient was started on IV Lasix and 1 dose of metolazone was also given.  Lower extremity edema improved, patient was discharged on Lasix 40 mg p.o. daily and potassium chloride 20 mEq p.o. daily  # Hypokalemia secondary to diuresis, potassium repleted. # Constipation: Continue  Miralax, Dulcolax qhs prn # Bilateral Knee pain: right Knee CT negative for fracture, osteoarthritis.  S/p Voltaren gel. 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  Recommended to follow-up  with orthopedic surgery as an outpatient. # Grieving, Her mother passed in November 2023 from fistula, sepsis.  Supportive care and counseling done. # Vitamin D level 30 at lower end so started  vitamin D 50,000 units p.o. weekly, follow with PCP to repeat vitamin D level after 3 to 6 months. # Vitamin B12 level 351, goal >400, started vitamin B12 1000 mcg p.o. daily. # Morbid obesity, Body mass index is 49.42 kg/m.  Interventions: Calorie restricted diet and daily exercise advised to lose body weight after recovery  Pain control  - [employer] Controlled Substance Reporting System database was reviewed but website was not working. - 14 day supply was provided. - Patient was instructed, not to drive, operate heavy machinery, perform activities at heights, swimming or participation in water activities or provide baby sitting services while on Pain, Sleep and Anxiety Medications; until her outpatient Physician has advised to do so again.  - Also recommended to not to take more than prescribed Pain, Sleep and Anxiety Medications.  Patient was seen by physical therapy, who recommended Home health, which was arranged. On the day of the discharge the patient's vitals were stable, and no other acute medical condition were reported by patient. the patient was felt safe to be discharge at Home with Home health.  Consultants: Neurosurgery and IR Procedures: L1 kyphoplasty  Discharge Exam: General: Appear in no distress, no Rash; Oral Mucosa Clear, moist. Cardiovascular: S1 and S2 Present, no Murmur, Respiratory: normal respiratory effort, Bilateral Air entry present and no Crackles, no wheezes Abdomen: Bowel Sound present, Soft and no tenderness, no hernia Extremities: no Pedal edema, no calf tenderness Neurology: alert and oriented to time, place, and person affect appropriate.  Filed Weights   08/11/22 1218 08/20/22 0929  Weight: (!) 147.4 kg (!) 147.4 kg   Vitals:   08/25/22 2327 08/26/22 0752  BP: (!) 143/65 (!) 115/53  Pulse: 68 65  Resp: 20 17  Temp: 98.2 F (36.8 C) 98.2 F (36.8 C)  SpO2: 94% 97%    DISCHARGE MEDICATION: Allergies as of 08/26/2022       Reactions    Bactrim [sulfamethoxazole-trimethoprim] Hives        Medication List     STOP taking these medications    ALPRAZolam 0.5 MG tablet Commonly known as: XANAX   amLODipine 5 MG tablet Commonly known as: NORVASC   fluconazole 150 MG tablet Commonly known as: Diflucan   hydrochlorothiazide 25 MG tablet Commonly known as: HYDRODIURIL   LORazepam 0.5 MG tablet Commonly known as: ATIVAN   nitrofurantoin (macrocrystal-monohydrate) 100 MG capsule Commonly known as: MACROBID       TAKE these medications    acetaminophen 325 MG tablet Commonly known as: TYLENOL Take 2 tablets (650 mg total) by mouth every 8 (eight) hours as needed for moderate pain, fever, headache or mild pain.   bisacodyl 5 MG EC tablet Commonly known as: bisacodyl Take 2 tablets (10 mg total) by mouth daily as needed for moderate constipation.   budesonide-formoterol 160-4.5 MCG/ACT inhaler Commonly known as: SYMBICORT Inhale 2 puffs into the lungs 2 (two) times daily.   busPIRone 15 MG tablet Commonly known as: BUSPAR Take 15 mg by mouth 2 (two) times daily as needed.   cyanocobalamin 1000 MCG tablet Take 1 tablet (1,000 mcg total) by mouth daily. Start taking on: August 27, 2022   furosemide 40 MG tablet Commonly known as: LASIX Take 1 tablet (40 mg total) by mouth daily. What changed:  medication strength  how much to take when to take this   HYDROcodone-acetaminophen 10-325 MG tablet Commonly known as: NORCO Take 1 tablet by mouth every 8 (eight) hours as needed for up to 14 days for moderate pain or severe pain.   lamoTRIgine 200 MG tablet Commonly known as: LAMICTAL Take 200 mg by mouth at bedtime.   omeprazole 20 MG capsule Commonly known as: PRILOSEC Take 20 mg by mouth in the morning and at bedtime.   polyethylene glycol 17 g packet Commonly known as: MIRALAX / GLYCOLAX Take 17 g by mouth daily.   potassium chloride SA 20 MEQ tablet Commonly known as: KLOR-CON M Take 1  tablet (20 mEq total) by mouth daily.   tiZANidine 4 MG tablet Commonly known as: ZANAFLEX Take 1 tablet (4 mg total) by mouth every 8 (eight) hours as needed for muscle spasms.   topiramate 50 MG tablet Commonly known as: TOPAMAX Take 50 mg by mouth at bedtime.   traZODone 50 MG tablet Commonly known as: DESYREL Take 50 mg by mouth at bedtime.   valACYclovir 1000 MG tablet Commonly known as: VALTREX Take 1,000 mg by mouth daily.   venlafaxine XR 150 MG 24 hr capsule Commonly known as: EFFEXOR-XR Take 1 capsule by mouth daily.   Ventolin HFA 108 (90 Base) MCG/ACT inhaler Generic drug: albuterol   Vitamin D (Ergocalciferol) 1.25 MG (50000 UNIT) Caps capsule Commonly known as: DRISDOL Take 1 capsule (50,000 Units total) by mouth every 7 (seven) days. Start taking on: August 31, 2022               Durable Medical Equipment  (From admission, onward)           Start     Ordered   08/26/22 [id number]  For home use only DME Hospital bed  Once       Comments: Therapeutic Mattress  Question Answer Comment  Length of Need 6 Months   The above medical condition requires: Patient requires the ability to reposition frequently   Bed type Semi-electric      08/26/22 0943   08/24/22 1407  For home use only DME standard manual wheelchair with seat cushion  Once       Comments: Will need Bariatric Wheelchair due to Body Habitus Patient suffers from Compression fracture of L1 lumbar vertebra, which impairs their ability to perform daily activities like ADLS in the home.  A walking aide  will not resolve issue with performing activities of daily living. A wheelchair will allow patient to safely perform daily activities. Patient can safely propel the wheelchair in the home or has a caregiver who can provide assistance. Length of need 6 months. Accessories: elevating leg rests (ELRs), wheel locks, extensions and anti-tippers.Back cushion   08/24/22 1408   08/21/22 1636  For home use only  DME lightweight manual wheelchair with seat cushion  Once       Comments: Patient suffers from back injury which impairs their ability to perform daily activities like toileting in the home.  A walker will not resolve  issue with performing activities of daily living. A wheelchair will allow patient to safely perform daily activities. Patient is not able to propel themselves in the home using a standard weight wheelchair due to endurance. Patient can self propel in the lightweight wheelchair. Length of need 6 months . Accessories: elevating leg rests (ELRs), wheel locks, extensions and anti-tippers.   08/21/22 1636   08/18/22 1456  For home use only DME Bedside commode  Once       Comments: Due to Body Habitus the patient will require a Bariatric COmmode  Question:  Patient needs a bedside commode to treat with the following condition  Answer:  Impaired mobility   08/18/22 1456   08/18/22 1456  For home use only DME Walker rolling  Once       Comments: Due to Body Habitus the patient will require a bariatric RW  Question Answer Comment  Walker: With [place]   Patient needs a walker to treat with the following condition Impaired mobility      08/18/22 1456           Allergies  Allergen Reactions   Bactrim [Sulfamethoxazole-Trimethoprim] Hives   Discharge Instructions     Call MD for:  extreme fatigue   Complete by: As directed    Call MD for:  persistant dizziness or light-headedness   Complete by: As directed    Call MD for:  severe uncontrolled pain   Complete by: As directed    Fluid restriction 1.5 L per day   Diet - low sodium heart healthy   Complete by: As directed    Discharge instructions   Complete by: As directed    F/u with PCP in 1-2 wks Follow-up with neurosurgery in 1 to 2 weeks if pain does not improve or get worse.   Increase activity slowly   Complete by: As directed        The results of significant diagnostics from this hospitalization  (including imaging, microbiology, ancillary and laboratory) are listed below for reference.    Significant Diagnostic Studies: DG Lumbar Spine 2-3 Views  Result Date: 08/21/2022 CLINICAL DATA:  Acute lower back pain after possible injury. EXAM: LUMBAR SPINE - 2-3 VIEW COMPARISON:  August 14, 2022. FINDINGS: Status post L1 kyphoplasty. No acute fracture is noted. Minimal degenerative disc disease is noted at L3-4 and L4-5. Minimal grade 1 anterolisthesis of L4-5 is noted secondary to posterior facet joint hypertrophy. IMPRESSION: Status post L1 kyphoplasty. Multilevel degenerative disc disease. No acute abnormality seen. Electronically Signed   By: Marijo Conception M.D.   On: 08/21/2022 12:49   IR KYPHO LUMBAR INC FX REDUCE BONE BX UNI/BIL CANNULATION INC/IMAGING  Result Date: 08/20/2022 INDICATION: Acute to subacute L1 compression fracture with pain refractory to conservative measures EXAM: L1 vertebral body augmentation using balloon kyphoplasty COMPARISON:  None Available. MEDICATIONS: Documented in the EMR ANESTHESIA/SEDATION: Moderate (conscious) sedation was employed during this procedure. A total of Versed 3 mg and Fentanyl 150 mcg was administered intravenously by the radiology nurse. Total intra-service moderate Sedation Time: 42 minutes. The patient's level of consciousness and vital signs were monitored continuously by radiology nursing throughout the procedure under my direct supervision. FLUOROSCOPY: Radiation Exposure Index (as provided by the fluoroscopic device): 5.8 minutes ([id number] mGy) COMPLICATIONS: None immediate. PROCEDURE: Informed written consent was obtained from the patient after a thorough discussion of the procedural risks, benefits and alternatives. All questions were addressed. Maximal Sterile Barrier Technique was utilized including caps, mask, sterile gowns, sterile gloves, sterile drape, hand hygiene and skin antiseptic. A timeout was performed prior to the initiation of the  procedure. The patient was positioned prone on the exam table. A bipedicular access was planned. Skin entry site(s) were marked overlying the L1 vertebral body using fluoroscopy. The overlying skin was then prepped and draped in the standard sterile fashion. Local analgesia was obtained with 1% lidocaine. Attention was first turned to the right side.  Under fluoroscopic guidance, a 10 gauge introducer needle was advanced towards the lateral margin of the pedicle. Using multiple projections, the introducer needle was advanced towards the posterior margin of the vertebral body via a transpedicular approach. The inner needle was then removed, and the drill was advanced towards the anterior margin of the vertebral body. Attention was then turned to the contralateral side. Under fluoroscopic guidance, a 10 gauge introducer needle was advanced towards the lateral margin of the pedicle. Using multiple projections, the introducer needle was advanced towards the posterior margin of the vertebral body via a transpedicular approach. The inner needle was then removed, and the drill was advanced towards the anterior margin of the vertebral body. The Kyphon 15 mm inflatable bone tamps were then advanced through the bilateral transpedicular access needles and positioned within the mid vertebral body. Kyphoplasty was then performed, ensuring that the balloon contours stayed within the vertebral body margins. The balloons were then deflated and removed, followed by advancement of the bone filler devices bilaterally and the instillation of acrylic bone cement with excellent filling in the AP and lateral projections. No extravasation was noted in the disk spaces or posteriorly into the spinal canal. No epidural venous contamination was seen. At the end of the procedure, the introducer cannulas and bone filler devices were then removed without difficulty. Clean dressings were placed after hemostasis. The patient tolerated all aspects of  the procedure well, and was transferred to recovery in stable condition. IMPRESSION: 1. Successful L1 vertebral body augmentation using balloon kyphoplasty. If the patient has known osteoporosis, recommend treatment as clinically indicated. If the patient's bone density status is unknown, DEXA scan is recommended. Electronically Signed   By: Albin Felling M.D.   On: 08/20/2022 13:39   DG Knee 1-2 Views Left  Result Date: 08/15/2022 CLINICAL DATA:  Left knee pain after fall. EXAM: LEFT KNEE - 1-2 VIEW COMPARISON:  None Available. FINDINGS: Moderate to severe medial compartment joint space narrowing. Mild-to-moderate peripheral medial compartment degenerative osteophytes. Minimal peripheral lateral compartment degenerative osteophytosis. Mild superior greater than inferior patellar degenerative spurring. No joint effusion. No acute fracture or dislocation. 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 Electronically Signed   By: Yvonne Kendall M.D.   On: 08/15/2022 14:24   CT LUMBAR SPINE WO CONTRAST  Result Date: 08/15/2022 CLINICAL DATA:  L1 compression fracture follow-up. EXAM: CT LUMBAR SPINE WITHOUT CONTRAST TECHNIQUE: Multidetector CT imaging of the lumbar spine was performed without intravenous contrast administration. Multiplanar CT image reconstructions were also generated. RADIATION DOSE REDUCTION: This exam was performed according to the departmental dose-optimization program which includes automated exposure control, adjustment of the mA and/or kV according to patient size and/or use of iterative reconstruction technique. COMPARISON:  Lumbar spine CT 08/11/2022 FINDINGS: Segmentation: 5 lumbar type vertebrae. Alignment: Unchanged. There is slight dextroscoliosis and a minimal grade 1 anterolisthesis at L4-5 due to moderate to severe facet hypertrophy at that level. Vertebrae: Mild osteopenia. Again noted is a recent L1 upper plate anterior wedge compression fracture deformity,  with loss of anterior height of 30%, and no posterior height loss or retropulsion. There has been no significant interval change. No spinal canal hematoma is seen. Other lumbar vertebra are normal in heights. Paraspinal and other soft tissues: Mild aortic atherosclerosis. Nonobstructive left nephrolithiasis. No paraspinal hematoma. No fluid collection or masses. Disc levels: There is preservation of the normal lumbar disc heights. There is no bulge, herniation or stenosis T11-12 through L2-3. Facet joint spurring is seen at L1-2 and L2-3 without  foraminal encroachment. At L3-4, moderate facet spurring is seen and a mild circumferential disc bulge as well as dorsal epidural fat. There is mild-to-moderate secondary spinal canal stenosis and lateral recess effacement. There is moderate left-greater-than-right foraminal stenosis. At L4-5 with severe acquired spinal stenosis is seen due to disc bulge, advanced facet hypertrophy and dorsal epidural fat. Moderate to severe bilateral foraminal stenosis. At L5-S1 no disc bulge or canal stenosis are seen. Moderately advanced facet hypertrophy causes bilateral moderate to severe foraminal stenosis. Other: Both SI joints are patent with mild spurring. The visualized sacrum shows no insufficiency fracture. IMPRESSION: 1. Unchanged recent L1 upper plate anterior wedge compression fracture with 30% loss of anterior height and no posterior height loss or retropulsion. 2. Osteopenia and degenerative change. 3. L4-5 severe acquired spinal stenosis with moderate to severe bilateral foraminal stenosis. 4. L3-4 mild-to-moderate spinal canal stenosis with moderate left-greater-than-right foraminal stenosis. 5. L5-S1 moderate to severe bilateral foraminal stenosis. 6. Nonobstructive left nephrolithiasis. 7. Aortic atherosclerosis. Aortic Atherosclerosis (ICD10-I70.0). Electronically Signed   By: Telford Nab M.D.   On: 08/15/2022 02:34   CT Thoracic Spine Wo Contrast  Result Date:  08/11/2022 CLINICAL DATA:  Back pain after fall with known L1 vertebral body compression fracture EXAM: CT THORACIC SPINE WITHOUT CONTRAST TECHNIQUE: Multidetector CT images of the thoracic were obtained using the standard protocol without intravenous contrast. RADIATION DOSE REDUCTION: This exam was performed according to the departmental dose-optimization program which includes automated exposure control, adjustment of the mA and/or kV according to patient size and/or use of iterative reconstruction technique. COMPARISON:  Same day lumbar spine CT FINDINGS: Alignment: Preserved thoracic kyphosis without static listhesis. Vertebrae: Acute superior endplate compression fracture of the L1 vertebral body, as described on dedicated CT of the lumbar spine. Thoracic vertebral body heights are maintained without evidence of fracture. No pathologic bone process. Paraspinal and other soft tissues: Negative. Disc levels: Thoracic intervertebral disc heights are preserved. Minimal facet joint hypertrophy within the lower thoracic spine. IMPRESSION: 1. No acute osseous abnormality of the thoracic spine. 2. Acute superior endplate compression fracture of the L1 vertebral body, as described on dedicated CT of the lumbar spine. Electronically Signed   By: Davina Poke D.O.   On: 08/11/2022 16:08   CT Knee Right Wo Contrast  Result Date: 08/11/2022 CLINICAL DATA:  Knee trauma, no prior imaging (Age >= 5y) Unable to ambulate EXAM: CT OF THE RIGHT KNEE WITHOUT CONTRAST TECHNIQUE: Multidetector CT imaging of the right knee was performed according to the standard protocol. Multiplanar CT image reconstructions were also generated. RADIATION DOSE REDUCTION: This exam was performed according to the departmental dose-optimization program which includes automated exposure control, adjustment of the mA and/or kV according to patient size and/or use of iterative reconstruction technique. COMPARISON:  None Available. FINDINGS:  Bones/Joint/Cartilage There is no evidence of acute fracture. There is tricompartment osteophyte formation with moderate-severe medial compartment joint space narrowing. There is a trace joint effusion. There is a small ossified joint body in the superolateral knee measuring 6 mm. Ligaments Suboptimally assessed by CT. Muscles and Tendons No acute myotendinous abnormality by CT. Soft tissues No focal fluid collection. IMPRESSION: No evidence of acute fracture. 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 Trace joint effusion. Electronically Signed   By: Maurine Simmering M.D.   On: 08/11/2022 14:09   CT Lumbar Spine Wo Contrast  Result Date: 08/11/2022 CLINICAL DATA:  Back pain. EXAM: CT LUMBAR SPINE WITHOUT CONTRAST TECHNIQUE: Multidetector CT imaging of the lumbar spine was  performed without intravenous contrast administration. Multiplanar CT image reconstructions were also generated. RADIATION DOSE REDUCTION: This exam was performed according to the departmental dose-optimization program which includes automated exposure control, adjustment of the mA and/or kV according to patient size and/or use of iterative reconstruction technique. COMPARISON:  None Available. FINDINGS: Segmentation: 5 lumbar type vertebrae. Alignment: Trace anterolisthesis of L4 on L5. Vertebrae: There is an acute appearing superior endplate compression deformity at L1. There is no evidence of retropulsion. There is a proximally 40% height loss. No evidence of an epidural hematoma. Paraspinal and other soft tissues: Small left interpolar renal stone. No hydronephrosis. Disc levels: Moderate spinal canal stenosis at L4-L5 secondary to combination of a small disc bulge and ligamentum flavum hypertrophy. There is also moderate right-sided neural foraminal stenosis at L4-L5. IMPRESSION: 1. Acute appearing superior endplate compression deformity at L1 with approximately 40% height loss. No evidence of retropulsion. No  evidence of an epidural hematoma. 2. Moderate spinal canal stenosis at L4-L5 secondary to combination of a small disc bulge and ligamentum flavum hypertrophy. Moderate right-sided neural foraminal stenosis at L4-L5. Electronically Signed   By: Marin Roberts M.D.   On: 08/11/2022 14:00    Microbiology: Recent Results (from the past 240 hour(s))  Surgical pcr screen     Status: None   Collection Time: 08/20/22  9:15 AM   Specimen: Nasal Mucosa; Nasal Swab  Result Value Ref Range Status   MRSA, PCR NEGATIVE NEGATIVE Final   Staphylococcus aureus NEGATIVE NEGATIVE Final    Comment: (NOTE) The Xpert SA Assay (FDA approved for NASAL specimens in patients 105 years of age and older), is one component of a comprehensive surveillance program. It is not intended to diagnose infection nor to guide or monitor treatment. Performed at [facility], [address] [place], [place] [address]      Labs: CBC: Recent Labs  Lab 08/22/22 [phone] 08/23/22 0612 08/24/22 0525 08/25/22 0349 08/26/22 0535  WBC 6.8 6.9 6.4 7.2 6.4  HGB 12.7 12.8 12.6 12.4 12.2  HCT 39.9 39.3 39.0 38.7 37.4  MCV 89.5 89.9 90.7 91.1 90.8  PLT 251 246 238 245 [id number]   Basic Metabolic Panel: Recent Labs  Lab 08/22/22 0528 08/23/22 0612 08/24/22 0525 08/25/22 0349 08/26/22 0535  NA 137 138 140 138 140  K 3.3* 3.0* 3.8 3.6 3.5  CL 95* 97* 104 103 106  CO2 '29 30 29 28 28  '$ GLUCOSE 119* 119* 114* 114* 110*  BUN 30* 23* 23* 27* 24*  CREATININE 0.90 0.89 0.83 1.09* 0.76  CALCIUM 9.2 9.0 9.0 9.0 8.9  MG 2.3 2.0 2.1  --   --   PHOS 4.3 3.2 4.3  --   --    Liver Function Tests: No results for input(s): "AST", "ALT", "ALKPHOS", "BILITOT", "PROT", "ALBUMIN" in the last 168 hours. No results for input(s): "LIPASE", "AMYLASE" in the last 168 hours. No results for input(s): "AMMONIA" in the last 168 hours. Cardiac Enzymes: No results for input(s): "CKTOTAL", "CKMB", "CKMBINDEX", "TROPONINI" in the last 168  hours. BNP (last 3 results) No results for input(s): "BNP" in the last 8760 hours. CBG: No results for input(s): "GLUCAP" in the last 168 hours.  Time spent: 35 minutes  Signed:  Val Riles  [facility] 08/26/2022 12:06 PM

## 2022-08-26 NOTE — TOC Progression Note (Signed)
Transition of Care [facility]) - Progression Note    Patient Details  Name: Brittany Cobb MRN: [id number] Date of Birth: 12-Jun-1971  Transition of Care [facility]) CM/SW [place], RN Phone Number: 08/26/2022, 9:22 AM  Clinical Narrative:     Celesta Aver will deliver a hospital bed to the patient at home, She will need EMS to go home at DC  Expected Discharge Plan: OP Rehab Barriers to Discharge: Inadequate or no insurance  Expected Discharge Plan and Services   Discharge Planning Services: CM Consult Post Acute Care Choice: [place] arrangements for the past 2 months: Mobile Home                 DME Arranged: Gilford Rile rolling, 3-N-1 DME Agency: [employer] Date DME Agency Contacted: 08/13/22 Time DME Agency Contacted: [phone] Representative spoke with at DME Agency: [place]: NA [place] Agency: NA Date [place]: 08/13/22 Time [place]: [id number] Representative spoke with at [place]: [place] Determinants of Health ([place]) Interventions SDOH Screenings   Food Insecurity: No Food Insecurity (08/12/2022)  Housing: Low Risk  (08/12/2022)  Transportation Needs: No Transportation Needs (08/13/2022)  Utilities: Not At Risk (08/12/2022)  Tobacco Use: High Risk (08/20/2022)    Readmission Risk Interventions     No data to display

## 2022-08-28 NOTE — Progress Notes (Unsigned)
Referring Physician:  Marzetta Board, DO No address on file  Primary Physician:  Marzetta Board, DO  History of Present Illness: 09/01/2022 Brittany Cobb has a history of GERD, HTN, obesity, bipolar, migraines, mixed restrictive/obstructive lung disease.   Seen as hospital consult- she had a fall on 08/12/22 with L1 compression fracture. She was given LSO and admitted for pain control. She then had L1 kyphoplasty by Dr. Denna Haggard on 08/20/22. SNF recommended on discharge, but she declined.   She was seen in ED at [facility] on 08/27/22 as she felt she was unable to care for herself. SW consulted- she refused placement at [facility]. She went back home.   She is here for follow up.   She continues with constant LBP with some radiation into her hips. Pain is worse at night- she cannot sleep. Pain is worse with any activity. Back brace does not fit. She has intermittent numbness/tingling in anterior legs with prolonged sitting. She has known bilateral knee OA- right knee has been worse since her fall.   She has HHPT through [facility]. Unable to tolerate hospital bed- she has been sleeping in lift chair. She is not sleeping much at all.   She has f/u with her PCP on Monday 09/07/22.   Bowel/Bladder Dysfunction: none  Conservative measures:  Physical therapy: HHPT with [facility]  Multimodal medical therapy including regular antiinflammatories: tylenol, norco, zanaflex  Injections: Had epidural steroid injections years ago with no relief.   Past Surgery:  L1 kyphoplasty by Dr. Denna Haggard on 08/20/22   The symptoms are causing a significant impact on the patient's life.   Review of Systems:  A 10 point review of systems is negative, except for the pertinent positives and negatives detailed in the HPI.  Past Medical History: Past Medical History:  Diagnosis Date   Acid reflux    Anxiety    Depression     Past Surgical History: Past Surgical History:  Procedure Laterality Date   BREAST  BIOPSY Left 2014   benign with s shaped clip   ENDOMETRIAL ABLATION     IR KYPHO LUMBAR INC FX REDUCE BONE BX UNI/BIL CANNULATION INC/IMAGING  08/20/2022   KNEE SURGERY Left    TONSILLECTOMY     TUBAL LIGATION      Allergies: Allergies as of 09/01/2022 - Review Complete 09/01/2022  Allergen Reaction Noted   Bactrim [sulfamethoxazole-trimethoprim] Hives 03/26/2015    Medications: Outpatient Encounter Medications as of 09/01/2022  Medication Sig   acetaminophen (TYLENOL) 325 MG tablet Take 2 tablets (650 mg total) by mouth every 8 (eight) hours as needed for moderate pain, fever, headache or mild pain.   bisacodyl 5 MG EC tablet Take 2 tablets (10 mg total) by mouth daily as needed for moderate constipation.   budesonide-formoterol (SYMBICORT) 160-4.5 MCG/ACT inhaler Inhale 2 puffs into the lungs 2 (two) times daily.   busPIRone (BUSPAR) 15 MG tablet Take 15 mg by mouth 2 (two) times daily as needed.   cyanocobalamin 1000 MCG tablet Take 1 tablet (1,000 mcg total) by mouth daily.   furosemide (LASIX) 40 MG tablet Take 1 tablet (40 mg total) by mouth daily.   HYDROcodone-acetaminophen (NORCO) 10-325 MG tablet Take 1 tablet by mouth every 8 (eight) hours as needed for up to 14 days for moderate pain or severe pain.   lamoTRIgine (LAMICTAL) 200 MG tablet Take 200 mg by mouth at bedtime.   methocarbamol (ROBAXIN) 500 MG tablet Take 1 tablet (500 mg total) by mouth every 8 (  eight) hours as needed for muscle spasms.   omeprazole (PRILOSEC) 20 MG capsule Take 20 mg by mouth in the morning and at bedtime.   polyethylene glycol (MIRALAX / GLYCOLAX) 17 g packet Take 17 g by mouth daily.   potassium chloride SA (KLOR-CON M) 20 MEQ tablet Take 1 tablet (20 mEq total) by mouth daily.   topiramate (TOPAMAX) 50 MG tablet Take 50 mg by mouth at bedtime.   traZODone (DESYREL) 50 MG tablet Take 50 mg by mouth at bedtime.   valACYclovir (VALTREX) 1000 MG tablet Take 1,000 mg by mouth daily.   venlafaxine  XR (EFFEXOR-XR) 150 MG 24 hr capsule Take 1 capsule by mouth daily.   VENTOLIN HFA 108 (90 Base) MCG/ACT inhaler    Vitamin D, Ergocalciferol, (DRISDOL) 1.25 MG (50000 UNIT) CAPS capsule Take 1 capsule (50,000 Units total) by mouth every 7 (seven) days.   [DISCONTINUED] tiZANidine (ZANAFLEX) 4 MG tablet Take 1 tablet (4 mg total) by mouth every 8 (eight) hours as needed for muscle spasms.   No facility-administered encounter medications on file as of 09/01/2022.    Social History: Social History   Tobacco Use   Smoking status: Former    Types: Cigarettes    Quit date: 09/2018    Years since quitting: 3.9   Smokeless tobacco: Never  Substance Use Topics   Alcohol use: No   Drug use: No    Family Medical History: History reviewed. No pertinent family history.  Physical Examination: Vitals:   09/01/22 0903 09/01/22 0953  BP: (!) 142/82 (!) 140/82    General: Patient is well developed, well nourished, calm, collected, and in no apparent distress. Attention to examination is appropriate.  Respiratory: Patient is breathing without any difficulty.   NEUROLOGICAL:     Awake, alert, oriented to person, place, and time.  Speech is clear and fluent. Fund of knowledge is appropriate.   Cranial Nerves: Pupils equal round and reactive to light.  Facial tone is symmetric.    Diffuse thoracolumbar tenderness.   No abnormal lesions on exposed skin.   Strength: Side Biceps Triceps Deltoid Interossei Grip Wrist Ext. Wrist Flex.  R '5 5 5 5 5 5 5  '$ L '5 5 5 5 5 5 5   '$ Side Iliopsoas Quads Hamstring PF DF EHL  R '5 5 5 5 5 5  '$ L '5 5 5 5 5 5   '$ Reflexes are 2+ and symmetric at the biceps, triceps, brachioradialis, patella and achilles.   Hoffman's is absent.  Clonus is not present.   Bilateral upper and lower extremity sensation is intact to light touch.     She ambulates with a walker.   Medical Decision Making  Imaging: Lumbar xrays dated 08/21/22:  FINDINGS: Status post L1  kyphoplasty. No acute fracture is noted. Minimal degenerative disc disease is noted at L3-4 and L4-5. Minimal grade 1 anterolisthesis of L4-5 is noted secondary to posterior facet joint hypertrophy.   IMPRESSION: Status post L1 kyphoplasty. Multilevel degenerative disc disease. No acute abnormality seen.     Electronically Signed   By: Marijo Conception M.D.   On: 08/21/2022 12:49  CT scan of lumbar spine dated 08/14/22:  FINDINGS: Segmentation: 5 lumbar type vertebrae.   Alignment: Unchanged. There is slight dextroscoliosis and a minimal grade 1 anterolisthesis at L4-5 due to moderate to severe facet hypertrophy at that level.   Vertebrae: Mild osteopenia. Again noted is a recent L1 upper plate anterior wedge compression fracture deformity, with loss  of anterior height of 30%, and no posterior height loss or retropulsion.   There has been no significant interval change. No spinal canal hematoma is seen. Other lumbar vertebra are normal in heights.   Paraspinal and other soft tissues: Mild aortic atherosclerosis. Nonobstructive left nephrolithiasis. No paraspinal hematoma. No fluid collection or masses.   Disc levels: There is preservation of the normal lumbar disc heights. There is no bulge, herniation or stenosis T11-12 through L2-3. Facet joint spurring is seen at L1-2 and L2-3 without foraminal encroachment.   At L3-4, moderate facet spurring is seen and a mild circumferential disc bulge as well as dorsal epidural fat. There is mild-to-moderate secondary spinal canal stenosis and lateral recess effacement. There is moderate left-greater-than-right foraminal stenosis.   At L4-5 with severe acquired spinal stenosis is seen due to disc bulge, advanced facet hypertrophy and dorsal epidural fat. Moderate to severe bilateral foraminal stenosis.   At L5-S1 no disc bulge or canal stenosis are seen. Moderately advanced facet hypertrophy causes bilateral moderate to  severe foraminal stenosis.   Other: Both SI joints are patent with mild spurring. The visualized sacrum shows no insufficiency fracture.   IMPRESSION: 1. Unchanged recent L1 upper plate anterior wedge compression fracture with 30% loss of anterior height and no posterior height loss or retropulsion. 2. Osteopenia and degenerative change. 3. L4-5 severe acquired spinal stenosis with moderate to severe bilateral foraminal stenosis. 4. L3-4 mild-to-moderate spinal canal stenosis with moderate left-greater-than-right foraminal stenosis. 5. L5-S1 moderate to severe bilateral foraminal stenosis. 6. Nonobstructive left nephrolithiasis. 7. Aortic atherosclerosis.   Aortic Atherosclerosis (ICD10-I70.0).     Electronically Signed   By: Telford Nab M.D.   On: 08/15/2022 02:34  I have personally reviewed the images and agree with the above interpretation.   Assessment and Plan: Brittany Cobb is a pleasant 52 y.o. female has constant LBP with some radiation into her hips. Pain is worse at night- she cannot sleep. She also has intermittent numbness/tingling in anterior legs with prolonged sitting.   She has known L1 compression fracture s/p fall on 08/12/22. She had L1 kyphoplasty by Dr. Denna Haggard on 08/20/22.   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 This may be contributing to her pain along with persistent pain from L1 fracture (did not see much relief with kyphoplasty).   Treatment options discussed with patient and following plan made:   - Agree with HHPT.  - She will discuss with home health about getting another hospital bed. May need to discuss with PCP as well.  - She does not need to wear back brace.  - No bending twisting or lifting.  - Continue prn norco. No refills needed.  - No relief with zanaflex. She will stop it.  - Prescription for robaxin to take prn muscle spasms. Reviewed dosing and  side effects. Discussed this can cause drowsiness.  - Discussed referral to pain management/PMR to discuss possible lumbar injections. She wants to discuss with PCP. She has f/u with PCP on Monday 09/07/22.  - Will message her next week to regroup.   BP was elevated, it was 142/82 initially and then 140/82 at the end of her visit. No symptoms of chest pain, shortness of breath, blurry vision, or headaches.   She has f/u with PCP on Monday and will discuss BP at that time.   If she develops CP, SOB, blurry vision, or headaches, then she will go to ED.  I spent a total of 30 minutes in face-to-face and non-face-to-face activities related to this patient's care today including review of outside records, review of imaging, review of symptoms, physical exam, discussion of differential diagnosis, discussion of treatment options, and documentation.   Thank you for involving me in the care of this patient.   Geronimo Boot PA-C Dept. of Neurosurgery

## 2022-09-01 ENCOUNTER — Encounter: Payer: Self-pay | Admitting: Orthopedic Surgery

## 2022-09-01 ENCOUNTER — Ambulatory Visit: Payer: Medicaid Other | Admitting: Orthopedic Surgery

## 2022-09-01 VITALS — BP 140/82 | Ht 68.0 in | Wt 324.0 lb

## 2022-09-01 DIAGNOSIS — S32010S Wedge compression fracture of first lumbar vertebra, sequela: Secondary | ICD-10-CM

## 2022-09-01 DIAGNOSIS — S32010D Wedge compression fracture of first lumbar vertebra, subsequent encounter for fracture with routine healing: Secondary | ICD-10-CM

## 2022-09-01 DIAGNOSIS — M47816 Spondylosis without myelopathy or radiculopathy, lumbar region: Secondary | ICD-10-CM

## 2022-09-01 DIAGNOSIS — M48061 Spinal stenosis, lumbar region without neurogenic claudication: Secondary | ICD-10-CM

## 2022-09-01 DIAGNOSIS — M4316 Spondylolisthesis, lumbar region: Secondary | ICD-10-CM

## 2022-09-01 MED ORDER — METHOCARBAMOL 500 MG PO TABS: 500.0000 mg | ORAL_TABLET | Freq: Three times a day (TID) | ORAL | 0 refills | Status: DC | PRN

## 2022-09-01 NOTE — Patient Instructions (Signed)
It was so nice to see you today. Thank you so much for coming in.    You have some wear and tear (arthritis) in your lower back. This along with the compression fracture may be causing your pain.   Continue with home health PT. Have them contact us with any concerns.   You do not need to wear your brace.   No bending, twisting, or lifting.   Continue on hydrocodone as directed for severe pain. Remember this can make you sleepy and/or constipated.   I also sent a prescription for methocarbamol to help with muscle spasms. Use only as needed and be careful, this can make you sleepy. Stop the tizanidine.   I will message you next week to check on you. We can consider having you see pain management to discuss possible injections.   Please do not hesitate to call if you have any questions or concerns. You can also message me in [place].   Geronimo Boot PA-C [phone]

## 2022-09-09 ENCOUNTER — Telehealth: Payer: Self-pay | Admitting: Orthopedic Surgery

## 2022-09-09 NOTE — Telephone Encounter (Signed)
Connie from [facility] Call for verbal order for OT 1 x 3 [phone] if she does not answer you can leave a message on her secure v/m

## 2022-09-09 NOTE — Telephone Encounter (Signed)
I spoke with Marlowe Kays and gave the verbal order for OT.

## 2022-11-09 ENCOUNTER — Telehealth: Payer: Self-pay | Admitting: Neurosurgery

## 2022-11-09 NOTE — Telephone Encounter (Signed)
[facility] PT Verbal Orders  Wants to continue treatment at 1 x 8weeks   CB: [phone]

## 2022-11-09 NOTE — Telephone Encounter (Signed)
I didn't order her initial HHPT, it may have been her PCP.   She elected to follow up with PCP at [facility]. Would have HH ask them about continuing orders as she does not have a follow up with me.

## 2022-11-30 NOTE — Progress Notes (Signed)
Referring Physician:  [name], DO No address on file  Primary Physician:  [name], DO  History of Present Illness: Ms. Sachet Tafel has a history of GERD, HTN, obesity, bipolar, migraines, mixed restrictive/obstructive lung disease.   Last seen by me on 09/01/22- she has known L1 compression fracture s/p fall on 08/12/22. She had L1 kyphoplasty by Dr. Juliette Alcide on 08/20/22. She also has slip L4-L5 with facet hypertrophy and severe central stenosis with moderate/severe bilateral foraminal stenosis.   We discussed referral for lumbar injections and she wanted to follow up with her PCP for further care. She was to follow up with me prn.   She was doing better and walking more, but pain has been worse in last month. She has constant LBP with radiation to butt, no leg pain but she has intermittent numbness/tingling in her legs (from ankles down into feet). This is worse with prolonged sitting. Was doing HHPT with [facility]- 4 more weeks was approved, but they have not started yet.   Bowel/Bladder Dysfunction: none  Conservative measures:  Physical therapy: did HHPT with [facility] (over for a month)- more visits approved per patient but not started yet Multimodal medical therapy including regular antiinflammatories: tylenol, norco, zanaflex  Injections: Had epidural steroid injections years ago with no relief.   Past Surgery:  L1 kyphoplasty by Dr. Juliette Alcide on 08/20/22   The symptoms are causing a significant impact on the patient's life.   Review of Systems:  A 10 point review of systems is negative, except for the pertinent positives and negatives detailed in the HPI.  Past Medical History: Past Medical History:  Diagnosis Date   Acid reflux    Anxiety    Depression     Past Surgical History: Past Surgical History:  Procedure Laterality Date   BREAST BIOPSY Left 2014   benign with s shaped clip   ENDOMETRIAL ABLATION     IR KYPHO LUMBAR INC FX REDUCE BONE BX UNI/BIL  CANNULATION INC/IMAGING  08/20/2022   KNEE SURGERY Left    TONSILLECTOMY     TUBAL LIGATION      Allergies: Allergies as of 12/01/2022 - Review Complete 12/01/2022  Allergen Reaction Noted   Bactrim [sulfamethoxazole-trimethoprim] Hives 03/26/2015    Medications: Outpatient Encounter Medications as of 12/01/2022  Medication Sig   bisacodyl 5 MG EC tablet Take 2 tablets (10 mg total) by mouth daily as needed for moderate constipation.   budesonide-formoterol (SYMBICORT) 160-4.5 MCG/ACT inhaler Inhale 2 puffs into the lungs 2 (two) times daily.   busPIRone (BUSPAR) 15 MG tablet Take 15 mg by mouth 2 (two) times daily as needed.   furosemide (LASIX) 40 MG tablet Take 1 tablet (40 mg total) by mouth daily.   lamoTRIgine (LAMICTAL) 200 MG tablet Take 200 mg by mouth at bedtime.   methocarbamol (ROBAXIN) 500 MG tablet Take 1 tablet (500 mg total) by mouth every 8 (eight) hours as needed for muscle spasms.   omeprazole (PRILOSEC) 20 MG capsule Take 20 mg by mouth in the morning and at bedtime.   topiramate (TOPAMAX) 50 MG tablet Take 50 mg by mouth at bedtime.   traZODone (DESYREL) 50 MG tablet Take 50 mg by mouth at bedtime.   valACYclovir (VALTREX) 1000 MG tablet Take 1,000 mg by mouth daily.   venlafaxine XR (EFFEXOR-XR) 150 MG 24 hr capsule Take 1 capsule by mouth daily.   VENTOLIN HFA 108 (90 Base) MCG/ACT inhaler    potassium chloride SA (KLOR-CON M) 20 MEQ tablet  Take 1 tablet (20 mEq total) by mouth daily.   No facility-administered encounter medications on file as of 12/01/2022.    Social History: Social History   Tobacco Use   Smoking status: Former    Types: Cigarettes    Quit date: 09/2018    Years since quitting: 4.1   Smokeless tobacco: Never  Substance Use Topics   Alcohol use: No   Drug use: No    Family Medical History: History reviewed. No pertinent family history.  Physical Examination: Vitals:   12/01/22 1003  BP: 138/88   Awake, alert, oriented to  person, place, and time.  Speech is clear and fluent. Fund of knowledge is appropriate.   Cranial Nerves: Pupils equal round and reactive to light.  Facial tone is symmetric.    Diffuse lower lumbar tenderness.   No abnormal lesions on exposed skin.   Strength:  Side Iliopsoas Quads Hamstring PF DF EHL  R 5 5 5 5 5 5   L 5 5 5 5 5 5    Reflexes are 2+ and symmetric at the patella and achilles.   Clonus is not present.   Bilateral lower extremity sensation is intact to light touch.     She ambulates with a walking stick.   Medical Decision Making  Imaging: none   Assessment and Plan: Ms. Leydon is a pleasant 52 y.o. female has constant LBP with radiation to butt, no leg pain but she has intermittent numbness/tingling in her legs (from ankles down into feet).  She has history of L1 compression fracture s/p fall on 08/12/22. She had L1 kyphoplasty by Dr. Juliette Alcide on 08/20/22.   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   Treatment options discussed with patient and following plan made:   - Agree with restarting HHPT since it was approved.  - Referral to PMR to discuss possible lumbar injections.  - Discussed that she would need to lose weight prior to any surgery discussion (BMI would need to be at least 40).  - Her goal weight is 250-260 pounds.  - Her plan is to lose 20 pounds before she comes back to see me in 2 months.    I spent a total of 25 minutes in face-to-face and non-face-to-face activities related to this patient's care today including review of outside records, review of imaging, review of symptoms, physical exam, discussion of differential diagnosis, discussion of treatment options, and documentation.   Drake Leach PA-C Dept. of Neurosurgery

## 2022-12-01 ENCOUNTER — Encounter: Payer: Self-pay | Admitting: Orthopedic Surgery

## 2022-12-01 ENCOUNTER — Ambulatory Visit (INDEPENDENT_AMBULATORY_CARE_PROVIDER_SITE_OTHER): Payer: Medicaid Other | Admitting: Orthopedic Surgery

## 2022-12-01 VITALS — BP 138/88 | Ht 68.0 in | Wt 324.0 lb

## 2022-12-01 DIAGNOSIS — M47816 Spondylosis without myelopathy or radiculopathy, lumbar region: Secondary | ICD-10-CM

## 2022-12-01 DIAGNOSIS — W19XXXD Unspecified fall, subsequent encounter: Secondary | ICD-10-CM

## 2022-12-01 DIAGNOSIS — S32010D Wedge compression fracture of first lumbar vertebra, subsequent encounter for fracture with routine healing: Secondary | ICD-10-CM

## 2022-12-01 DIAGNOSIS — M4316 Spondylolisthesis, lumbar region: Secondary | ICD-10-CM | POA: Diagnosis not present

## 2022-12-01 DIAGNOSIS — M48061 Spinal stenosis, lumbar region without neurogenic claudication: Secondary | ICD-10-CM

## 2022-12-01 DIAGNOSIS — S32010S Wedge compression fracture of first lumbar vertebra, sequela: Secondary | ICD-10-CM

## 2022-12-01 NOTE — Patient Instructions (Addendum)
It was so nice to see you today. Thank you so much for coming in.    You have wear and tear in your back and this is what is likely causing your pain.   I want you to see physical medicine and rehab at the [facility] to discuss possible injections in your lower back. Dr. Yves Dill, Dr. Mariah Milling, and their PA Alphonzo Lemmings are great and will take good care of you. They should call you to schedule an appointment or you can call them at [phone].   Work on weight loss. Goal weight to consider surgery is 260 pounds. Your are committing to try to lose 20 pounds before you see me back in August.   I will see you back in 6-8 weeks. Please do not hesitate to call if you have any questions or concerns. You can also message me in MyChart.   Drake Leach PA-C [phone]

## 2023-02-02 NOTE — Progress Notes (Unsigned)
Referring Physician:  [name], DO No address on file  Primary Physician:  Brittany Clan, DO  History of Present Illness: [name] has a history of GERD, HTN, obesity, bipolar, migraines, mixed restrictive/obstructive lung disease.   Last seen by me on 12/01/22- she has known L1 compression fracture s/p fall on 08/12/22. She had L1 kyphoplasty by Dr. Juliette Cobb on 08/20/22. She also has slip L4-L5 with facet hypertrophy and severe central stenosis with moderate/severe bilateral foraminal stenosis.   She saw Dr. Mariah Cobb and had bilateral L5-S1 TF ESI on 01/22/23. She was to work on weight loss- did she lose 20 lbs?*** and restart HHPT.   She is here for follow up.          We discussed referral for lumbar injections and she wanted to follow up with her PCP for further care. She was to follow up with me prn.   She was doing better and walking more, but pain has been worse in last month. She has constant LBP with radiation to butt, no leg pain but she has intermittent numbness/tingling in her legs (from ankles down into feet). This is worse with prolonged sitting. Was doing HHPT with [facility]- 4 more weeks was approved, but they have not started yet.   Bowel/Bladder Dysfunction: none  Conservative measures:  Physical therapy: did HHPT with [facility] (over for a month)- more visits approved per patient but not started yet*** Multimodal medical therapy including regular antiinflammatories: tylenol, norco, zanaflex  Injections:  bilateral L5-S1 TF ESI on 01/22/23  Past Surgery:  L1 kyphoplasty by Dr. Juliette Cobb on 08/20/22   The symptoms are causing a significant impact on the patient's life.   Review of Systems:  A 10 point review of systems is negative, except for the pertinent positives and negatives detailed in the HPI.  Past Medical History: Past Medical History:  Diagnosis Date   Acid reflux    Anxiety    Depression     Past Surgical History: Past Surgical  History:  Procedure Laterality Date   BREAST BIOPSY Left 2014   benign with s shaped clip   ENDOMETRIAL ABLATION     IR KYPHO LUMBAR INC FX REDUCE BONE BX UNI/BIL CANNULATION INC/IMAGING  08/20/2022   KNEE SURGERY Left    TONSILLECTOMY     TUBAL LIGATION      Allergies: Allergies as of 02/03/2023 - Review Complete 12/01/2022  Allergen Reaction Noted   Bactrim [sulfamethoxazole-trimethoprim] Hives 03/26/2015    Medications: Outpatient Encounter Medications as of 02/03/2023  Medication Sig   bisacodyl 5 MG EC tablet Take 2 tablets (10 mg total) by mouth daily as needed for moderate constipation.   budesonide-formoterol (SYMBICORT) 160-4.5 MCG/ACT inhaler Inhale 2 puffs into the lungs 2 (two) times daily.   busPIRone (BUSPAR) 15 MG tablet Take 15 mg by mouth 2 (two) times daily as needed.   furosemide (LASIX) 40 MG tablet Take 1 tablet (40 mg total) by mouth daily.   lamoTRIgine (LAMICTAL) 200 MG tablet Take 200 mg by mouth at bedtime.   methocarbamol (ROBAXIN) 500 MG tablet Take 1 tablet (500 mg total) by mouth every 8 (eight) hours as needed for muscle spasms.   omeprazole (PRILOSEC) 20 MG capsule Take 20 mg by mouth in the morning and at bedtime.   potassium chloride SA (KLOR-CON M) 20 MEQ tablet Take 1 tablet (20 mEq total) by mouth daily.   topiramate (TOPAMAX) 50 MG tablet Take 50 mg by mouth at bedtime.   traZODone (  DESYREL) 50 MG tablet Take 50 mg by mouth at bedtime.   valACYclovir (VALTREX) 1000 MG tablet Take 1,000 mg by mouth daily.   venlafaxine XR (EFFEXOR-XR) 150 MG 24 hr capsule Take 1 capsule by mouth daily.   VENTOLIN HFA 108 (90 Base) MCG/ACT inhaler    No facility-administered encounter medications on file as of 02/03/2023.    Social History: Social History   Tobacco Use   Smoking status: Former    Current packs/day: 0.00    Types: Cigarettes    Quit date: 09/2018    Years since quitting: 4.3   Smokeless tobacco: Never  Substance Use Topics   Alcohol use:  No   Drug use: No    Family Medical History: No family history on file.  Physical Examination: There were no vitals filed for this visit.  Awake, alert, oriented to person, place, and time.  Speech is clear and fluent. Fund of knowledge is appropriate.   Cranial Nerves: Pupils equal round and reactive to light.  Facial tone is symmetric.    Diffuse lower lumbar tenderness.   No abnormal lesions on exposed skin.   Strength:  Side Iliopsoas Quads Hamstring PF DF EHL  R 5 5 5 5 5 5   L 5 5 5 5 5 5    Reflexes are 2+ and symmetric at the patella and achilles.   Clonus is not present.   Bilateral lower extremity sensation is intact to light touch.     She ambulates with a walking stick.   Medical Decision Making  Imaging: none   Assessment and Plan: [name] is a pleasant 52 y.o. female has constant LBP with radiation to butt, no leg pain but she has intermittent numbness/tingling in her legs (from ankles down into feet).  She has history of L1 compression fracture s/p fall on 08/12/22. She had L1 kyphoplasty by Dr. Juliette Cobb on 08/20/22.   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   Treatment options discussed with patient and following plan made:   - Agree with restarting HHPT since it was approved.  - Referral to PMR to discuss possible lumbar injections.  - Discussed that she would need to lose weight prior to any surgery discussion (BMI would need to be at least 40).  - Her goal weight is 250-260 pounds.  - Her plan is to lose 20 pounds before she comes back to see me in 2 months.    I spent a total of 25 minutes in face-to-face and non-face-to-face activities related to this patient's care today including review of outside records, review of imaging, review of symptoms, physical exam, discussion of differential diagnosis, discussion of treatment options, and documentation.    Brittany Leach PA-C Dept. of Neurosurgery

## 2023-02-03 ENCOUNTER — Ambulatory Visit: Payer: Medicaid Other | Admitting: Orthopedic Surgery

## 2023-02-03 ENCOUNTER — Encounter: Payer: Self-pay | Admitting: Orthopedic Surgery

## 2023-02-03 VITALS — BP 148/82 | Ht 68.0 in | Wt 324.0 lb

## 2023-02-03 DIAGNOSIS — M4316 Spondylolisthesis, lumbar region: Secondary | ICD-10-CM | POA: Diagnosis not present

## 2023-02-03 DIAGNOSIS — S32010D Wedge compression fracture of first lumbar vertebra, subsequent encounter for fracture with routine healing: Secondary | ICD-10-CM | POA: Diagnosis not present

## 2023-02-03 DIAGNOSIS — M4726 Other spondylosis with radiculopathy, lumbar region: Secondary | ICD-10-CM | POA: Diagnosis not present

## 2023-02-03 DIAGNOSIS — M47816 Spondylosis without myelopathy or radiculopathy, lumbar region: Secondary | ICD-10-CM

## 2023-02-03 DIAGNOSIS — M5416 Radiculopathy, lumbar region: Secondary | ICD-10-CM

## 2023-02-03 DIAGNOSIS — M48061 Spinal stenosis, lumbar region without neurogenic claudication: Secondary | ICD-10-CM

## 2023-02-03 DIAGNOSIS — W19XXXD Unspecified fall, subsequent encounter: Secondary | ICD-10-CM

## 2023-02-03 NOTE — Patient Instructions (Addendum)
It was so nice to see you today. Thank you so much for coming in.    I am sorry that you are hurting so much.   I sent physical therapy orders to [facility] in [place]. You can call them at [phone]  if you don't hear from them to schedule your visit.   Follow up with Dr. Mariah Milling as scheduled.   Continue to follow up with your PCP regarding wegovy. Continue to work on weight loss. I would also discuss your worsening depression with your PCP. Go to the ED if you are thinking about hurting yourself or someone else.   I will send you a message in 4 weeks to check on you. I will see you back in 2 months.   Please do not hesitate to call if you have any questions or concerns. You can also message me in MyChart.   Drake Leach PA-C [phone]

## 2023-03-26 NOTE — Progress Notes (Unsigned)
Referring Physician:  [name], DO No address on file  Primary Physician:  [name], DO  History of Present Illness: [name] has a history of GERD, HTN, obesity, bipolar, migraines, mixed restrictive/obstructive lung disease.   Last seen by me on 02/03/23- she has known L1 compression fracture s/p fall on 08/12/22. She had L1 kyphoplasty by Dr. Juliette Alcide on 08/20/22. She also has slip L4-L5 with facet hypertrophy and severe central stenosis with moderate/severe bilateral foraminal stenosis.   She was to start PT after her last visit (did not do). She saw Dr. Mariah Milling who recommended another ESI, but patient wanted to discuss with PCP. She continues to work on weight loss.   She is here for follow up.   She continues with constant LBP with radiation to butt. She now has intermittent bilateral leg pain posterior to her feet with walking. She has intermittent numbness/tingling in her legs (mostly in her feet). This is worse with prolonged sitting. She's afraid to drive due to this.   She continues on neurontin tid.   She cannot get wegovy approved. Has appointment at [facility] on 10/23 to discuss weight loss.    Bowel/Bladder Dysfunction: some urinary urgency, no issues with bowels.    Conservative measures:  Physical therapy: did HHPT with [facility] (over for a month)- no further visits scheduled.  Multimodal medical therapy including regular antiinflammatories: tylenol, norco, zanaflex, neurontin Injections:  bilateral L5-S1 TF ESI on 01/22/23 with no improvement  Past Surgery:  L1 kyphoplasty by Dr. Juliette Alcide on 08/20/22   The symptoms are causing a significant impact on the patient's life.   Review of Systems:  A 10 point review of systems is negative, except for the pertinent positives and negatives detailed in the HPI.  Past Medical History: Past Medical History:  Diagnosis Date   Acid reflux    Anxiety    Depression     Past Surgical History: Past Surgical  History:  Procedure Laterality Date   BREAST BIOPSY Left 2014   benign with s shaped clip   ENDOMETRIAL ABLATION     IR KYPHO LUMBAR INC FX REDUCE BONE BX UNI/BIL CANNULATION INC/IMAGING  08/20/2022   KNEE SURGERY Left    TONSILLECTOMY     TUBAL LIGATION      Allergies: Allergies as of 03/31/2023 - Review Complete 02/03/2023  Allergen Reaction Noted   Bactrim [sulfamethoxazole-trimethoprim] Hives 03/26/2015    Medications: Outpatient Encounter Medications as of 03/31/2023  Medication Sig   budesonide-formoterol (SYMBICORT) 160-4.5 MCG/ACT inhaler Inhale 2 puffs into the lungs 2 (two) times daily.   busPIRone (BUSPAR) 15 MG tablet Take 15 mg by mouth 2 (two) times daily as needed.   furosemide (LASIX) 40 MG tablet Take 1 tablet (40 mg total) by mouth daily.   gabapentin (NEURONTIN) 300 MG capsule Take 300 mg by mouth 3 (three) times daily.   HYDROcodone-acetaminophen (NORCO) 10-325 MG tablet Take 1-2 tablets by mouth as needed.   lamoTRIgine (LAMICTAL) 200 MG tablet Take 200 mg by mouth at bedtime.   omeprazole (PRILOSEC) 20 MG capsule Take 20 mg by mouth in the morning and at bedtime.   potassium chloride SA (KLOR-CON M) 20 MEQ tablet Take 1 tablet (20 mEq total) by mouth daily.   topiramate (TOPAMAX) 50 MG tablet Take 50 mg by mouth 2 (two) times daily.   traZODone (DESYREL) 50 MG tablet Take 50 mg by mouth at bedtime.   valACYclovir (VALTREX) 1000 MG tablet Take 1,000 mg by mouth daily.  venlafaxine XR (EFFEXOR-XR) 150 MG 24 hr capsule Take 1 capsule by mouth daily.   VENTOLIN HFA 108 (90 Base) MCG/ACT inhaler    No facility-administered encounter medications on file as of 03/31/2023.    Social History: Social History   Tobacco Use   Smoking status: Former    Current packs/day: 0.00    Types: Cigarettes    Quit date: 09/2018    Years since quitting: 4.5   Smokeless tobacco: Never  Substance Use Topics   Alcohol use: No   Drug use: No    Family Medical History: No  family history on file.  Physical Examination: There were no vitals filed for this visit.  Awake, alert, oriented to person, place, and time.  Speech is clear and fluent. Fund of knowledge is appropriate.   Cranial Nerves: Pupils equal round and reactive to light.  Facial tone is symmetric.    Left sided lower lumbar tenderness.   No abnormal lesions on exposed skin.   Strength:  Side Iliopsoas Quads Hamstring PF DF EHL  R 5 5 5 5 5 5   L 5 5 5 5 5 5    Clonus is not present.   Bilateral lower extremity sensation is intact to light touch.     She ambulates with a walking stick.    Medical Decision Making  Imaging: none   Assessment and Plan: [name] is a pleasant 52 y.o. female who continues with constant LBP with radiation to butt. She now has intermittent bilateral leg pain posterior to her feet with walking. She has intermittent numbness/tingling in her legs (mostly in her feet). This is worse with prolonged sitting- feet are numb when she stands and she's fallen.   She has history of L1 compression fracture s/p fall on 08/12/22. She had L1 kyphoplasty by Dr. Juliette Alcide on 08/20/22.   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   Treatment options discussed with patient and following plan made:   - Message to Dr. Mariah Milling. She wants to schedule bilateral L5-S1 TF with [facility].  - Continue with neurontin from Dr. Mariah Milling.  - Continue to work on weight loss and follow up at [facility] as scheduled. Weight goal is 250-260 lbs (BMI of at least 40). Would need updated imaging as well.  - She is getting ready to see a therapist and has seen a new psychiatrist. No SI or HI. Will go to ED if she has these.  - Follow up with me in 6-8 weeks and prn.   I spent a total of 20 minutes in face-to-face and non-face-to-face activities related to this patient's care today including review of outside  records, review of imaging, review of symptoms, physical exam, discussion of differential diagnosis, discussion of treatment options, and documentation.   Drake Leach PA-C Dept. of Neurosurgery

## 2023-03-31 ENCOUNTER — Encounter: Payer: Self-pay | Admitting: Orthopedic Surgery

## 2023-03-31 ENCOUNTER — Ambulatory Visit (INDEPENDENT_AMBULATORY_CARE_PROVIDER_SITE_OTHER): Payer: Medicaid Other | Admitting: Orthopedic Surgery

## 2023-03-31 VITALS — BP 134/82 | Ht 68.0 in | Wt 336.0 lb

## 2023-03-31 DIAGNOSIS — M5416 Radiculopathy, lumbar region: Secondary | ICD-10-CM

## 2023-03-31 DIAGNOSIS — M47816 Spondylosis without myelopathy or radiculopathy, lumbar region: Secondary | ICD-10-CM

## 2023-03-31 DIAGNOSIS — M4726 Other spondylosis with radiculopathy, lumbar region: Secondary | ICD-10-CM | POA: Diagnosis not present

## 2023-03-31 DIAGNOSIS — M4316 Spondylolisthesis, lumbar region: Secondary | ICD-10-CM

## 2023-03-31 DIAGNOSIS — S32010D Wedge compression fracture of first lumbar vertebra, subsequent encounter for fracture with routine healing: Secondary | ICD-10-CM

## 2023-03-31 DIAGNOSIS — M48061 Spinal stenosis, lumbar region without neurogenic claudication: Secondary | ICD-10-CM

## 2023-03-31 DIAGNOSIS — S32010S Wedge compression fracture of first lumbar vertebra, sequela: Secondary | ICD-10-CM

## 2023-03-31 DIAGNOSIS — W19XXXD Unspecified fall, subsequent encounter: Secondary | ICD-10-CM

## 2023-05-24 NOTE — Progress Notes (Unsigned)
Referring Physician:  [name], DO [address] [place],  [place] [address]  Primary Physician:  [name], DO  History of Present Illness: Brittany Cobb has a history of GERD, HTN, obesity, bipolar, migraines, mixed restrictive/obstructive lung disease.   Last seen by me on 03/31/23- she has known L1 compression fracture s/p fall on 08/12/22. She had L1 kyphoplasty by Dr. Juliette Alcide on 08/20/22. She also has slip L4-L5 with facet hypertrophy and severe central stenosis with moderate/severe bilateral foraminal stenosis.   She was to follow up with Dr. Mariah Milling to discuss repeat ESI after her last visit (has not seen her yet). She has lost 14 pounds on wegovy!  She is here for follow up.        She was to start PT after her last visit (did not do). She saw Dr. Mariah Milling who recommended another ESI, but patient wanted to discuss with PCP. She continues to work on weight loss.   She is here for follow up.   She continues with constant LBP with radiation to butt. She now has intermittent bilateral leg pain posterior to her feet with walking. She has intermittent numbness/tingling in her legs (mostly in her feet). This is worse with prolonged sitting. She's afraid to drive due to this.   She continues on neurontin tid.   She cannot get wegovy approved. Has appointment at [facility] on 10/23 to discuss weight loss.    Bowel/Bladder Dysfunction: some urinary urgency, no issues with bowels.    Conservative measures:  Physical therapy: did HHPT with [facility] (over for a month)- no further visits scheduled.  Multimodal medical therapy including regular antiinflammatories: tylenol, norco, zanaflex, neurontin Injections:  bilateral L5-S1 TF ESI on 01/22/23 with no improvement  Past Surgery:  L1 kyphoplasty by Dr. Juliette Alcide on 08/20/22   The symptoms are causing a significant impact on the patient's life.   Review of Systems:  A 10 point review of systems is negative, except for the pertinent  positives and negatives detailed in the HPI.  Past Medical History: Past Medical History:  Diagnosis Date   Acid reflux    Anxiety    Depression     Past Surgical History: Past Surgical History:  Procedure Laterality Date   BREAST BIOPSY Left 2014   benign with s shaped clip   ENDOMETRIAL ABLATION     IR KYPHO LUMBAR INC FX REDUCE BONE BX UNI/BIL CANNULATION INC/IMAGING  08/20/2022   KNEE SURGERY Left    TONSILLECTOMY     TUBAL LIGATION      Allergies: Allergies as of 05/25/2023 - Review Complete 03/31/2023  Allergen Reaction Noted   Bactrim [sulfamethoxazole-trimethoprim] Hives 03/26/2015    Medications: Outpatient Encounter Medications as of 05/25/2023  Medication Sig   budesonide-formoterol (SYMBICORT) 160-4.5 MCG/ACT inhaler Inhale 2 puffs into the lungs 2 (two) times daily.   busPIRone (BUSPAR) 15 MG tablet Take 15 mg by mouth 2 (two) times daily as needed.   furosemide (LASIX) 40 MG tablet Take 1 tablet (40 mg total) by mouth daily.   gabapentin (NEURONTIN) 300 MG capsule Take 300 mg by mouth 3 (three) times daily.   HYDROcodone-acetaminophen (NORCO) 10-325 MG tablet Take 1-2 tablets by mouth as needed.   lamoTRIgine (LAMICTAL) 200 MG tablet Take 200 mg by mouth at bedtime.   omeprazole (PRILOSEC) 20 MG capsule Take 20 mg by mouth in the morning and at bedtime.   potassium chloride SA (KLOR-CON M) 20 MEQ tablet Take 1 tablet (20 mEq total) by  mouth daily.   topiramate (TOPAMAX) 50 MG tablet Take 50 mg by mouth 2 (two) times daily.   traZODone (DESYREL) 50 MG tablet Take 50 mg by mouth at bedtime.   valACYclovir (VALTREX) 1000 MG tablet Take 1,000 mg by mouth daily.   venlafaxine XR (EFFEXOR-XR) 150 MG 24 hr capsule Take 1 capsule by mouth daily.   VENTOLIN HFA 108 (90 Base) MCG/ACT inhaler    No facility-administered encounter medications on file as of 05/25/2023.    Social History: Social History   Tobacco Use   Smoking status: Former    Current packs/day:  0.00    Types: Cigarettes    Quit date: 09/2018    Years since quitting: 4.6   Smokeless tobacco: Never  Substance Use Topics   Alcohol use: No   Drug use: No    Family Medical History: No family history on file.  Physical Examination: There were no vitals filed for this visit.  Awake, alert, oriented to person, place, and time.  Speech is clear and fluent. Fund of knowledge is appropriate.   Cranial Nerves: Pupils equal round and reactive to light.  Facial tone is symmetric.    Left sided lower lumbar tenderness.   No abnormal lesions on exposed skin.   Strength:  Side Iliopsoas Quads Hamstring PF DF EHL  R 5 5 5 5 5 5   L 5 5 5 5 5 5    Clonus is not present.   Bilateral lower extremity sensation is intact to light touch.     She ambulates with a walking stick.    Medical Decision Making  Imaging: none   Assessment and Plan: Brittany Cobb is a pleasant 52 y.o. female who continues with constant LBP with radiation to butt. She now has intermittent bilateral leg pain posterior to her feet with walking. She has intermittent numbness/tingling in her legs (mostly in her feet). This is worse with prolonged sitting- feet are numb when she stands and she's fallen.   She has history of L1 compression fracture s/p fall on 08/12/22. She had L1 kyphoplasty by Dr. Juliette Alcide on 08/20/22.   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   Treatment options discussed with patient and following plan made:   - Message to Dr. Mariah Milling. She wants to schedule bilateral L5-S1 TF with [facility].  - Continue with neurontin from Dr. Mariah Milling.  - Continue to work on weight loss and follow up at [facility] as scheduled. Weight goal is 250-260 lbs (BMI of at least 40). Would need updated imaging as well.  - She is getting ready to see a therapist and has seen a new psychiatrist. No SI or HI. Will go to ED if she has  these.  - Follow up with me in 6-8 weeks and prn.   I spent a total of 20 minutes in face-to-face and non-face-to-face activities related to this patient's care today including review of outside records, review of imaging, review of symptoms, physical exam, discussion of differential diagnosis, discussion of treatment options, and documentation.   Drake Leach PA-C Dept. of Neurosurgery

## 2023-05-25 ENCOUNTER — Ambulatory Visit: Payer: Medicaid Other | Admitting: Orthopedic Surgery

## 2023-05-25 NOTE — Telephone Encounter (Signed)
Please reach out to Dr. Mariah Milling' staff and let them know to contact her about injections.

## 2023-08-13 NOTE — Progress Notes (Signed)
 Referring Physician:  No referring provider defined for this encounter.  Primary Physician:  [name], DO  History of Present Illness: Brittany Cobb has a history of GERD, HTN, obesity, bipolar, migraines, mixed restrictive/obstructive lung disease.   Last seen by me on 03/31/23- she has known L1 compression fracture s/p fall on 08/12/22. She had L1 kyphoplasty by Dr. Juliette Alcide on 08/20/22. She also has slip L4-L5 with facet hypertrophy and severe central stenosis with moderate/severe bilateral foraminal stenosis.   She has been working on weight loss and she has lost 34 pounds! She is on wegovy.   She had bilateral L5-S1 TF ESI on 07/09/23 with Dr. Mariah Milling. She did not recommend further injections.   She is here for follow up.   She continues with constant LBP along with more constant bilateral leg pain posterior to her feet. This is worse with prolonged standing and walking. The numbness and tingling in her legs has improved. She has weakness in her legs. She is resting a little better at night.   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   She continues on neurontin tid.   She quit smoking in 2020.   Bowel/Bladder Dysfunction: some urinary urgency, no issues with bowels. No perineal numbness.   Conservative measures:  Physical therapy: did HHPT with [facility] from 08/31/22 until 12/08/22 Multimodal medical therapy including regular antiinflammatories: tylenol, norco, zanaflex, neurontin Injections:  bilateral L5-S1 TF ESI on 07/09/23 bilateral L5-S1 TF ESI on 01/22/23 with no improvement  Past Surgery:  L1 kyphoplasty by Dr. Juliette Alcide on 08/20/22   The symptoms are causing a significant impact on the patient's life.   Review of Systems:  A 10 point review of systems is negative, except for the pertinent positives and negatives detailed in the HPI.  Past Medical History: Past Medical History:  Diagnosis Date   Acid reflux    Anxiety    Depression      Past Surgical History: Past Surgical History:  Procedure Laterality Date   BREAST BIOPSY Left 2014   benign with s shaped clip   ENDOMETRIAL ABLATION     IR KYPHO LUMBAR INC FX REDUCE BONE BX UNI/BIL CANNULATION INC/IMAGING  08/20/2022   KNEE SURGERY Left    TONSILLECTOMY     TUBAL LIGATION      Allergies: Allergies as of 08/17/2023 - Review Complete 03/31/2023  Allergen Reaction Noted   Bactrim [sulfamethoxazole-trimethoprim] Hives 03/26/2015    Medications: Outpatient Encounter Medications as of 08/17/2023  Medication Sig   amLODipine-valsartan (EXFORGE) 5-160 MG tablet Take 1 tablet by mouth daily.   ARIPiprazole (ABILIFY) 5 MG tablet Take 5 mg by mouth daily.   atorvastatin (LIPITOR) 20 MG tablet Take 20 mg by mouth daily.   budesonide-formoterol (SYMBICORT) 160-4.5 MCG/ACT inhaler Inhale 2 puffs into the lungs 2 (two) times daily.   furosemide (LASIX) 40 MG tablet Take 1 tablet (40 mg total) by mouth daily.   gabapentin (NEURONTIN) 300 MG capsule Take 300 mg by mouth 3 (three) times daily.   HYDROcodone-acetaminophen (NORCO) 10-325 MG tablet Take 1-2 tablets by mouth as needed.   ketoconazole (NIZORAL) 2 % cream SMARTSIG:1 Application Topical 1 to 2 Times Daily   lamoTRIgine (LAMICTAL) 200 MG tablet Take 200 mg by mouth at bedtime.   montelukast (SINGULAIR) 10 MG tablet SINGULAIR 10 MG TABS   nystatin (MYCOSTATIN/NYSTOP) powder Apply to affected area 3 times daily   omeprazole (PRILOSEC) 20 MG capsule Take 20 mg  by mouth in the morning and at bedtime.   ondansetron (ZOFRAN-ODT) 4 MG disintegrating tablet Dissolve 1 tablet (4 mg total) in the mouth every eight (8) hours as needed for nausea.   polyethylene glycol (GOLYTELY) 236 g solution Take by mouth as directed per [facility] GI prep instructions, for split bowel prep.   Semaglutide-Weight Management 1.7 MG/0.75ML SOAJ Inject into the skin.   tiZANidine (ZANAFLEX) 2 MG tablet Take 1 tablet p.o. nightly as needed muscle spasms    topiramate (TOPAMAX) 50 MG tablet Take 50 mg by mouth 2 (two) times daily.   traZODone (DESYREL) 50 MG tablet Take 50 mg by mouth at bedtime.   valACYclovir (VALTREX) 1000 MG tablet Take 1,000 mg by mouth daily.   venlafaxine XR (EFFEXOR-XR) 150 MG 24 hr capsule Take 1 capsule by mouth daily.   VENTOLIN HFA 108 (90 Base) MCG/ACT inhaler    potassium chloride SA (KLOR-CON M) 20 MEQ tablet Take 1 tablet (20 mEq total) by mouth daily.   No facility-administered encounter medications on file as of 08/17/2023.    Social History: Social History   Tobacco Use   Smoking status: Former    Current packs/day: 0.00    Types: Cigarettes    Quit date: 09/2018    Years since quitting: 4.9   Smokeless tobacco: Never  Substance Use Topics   Alcohol use: No   Drug use: No    Family Medical History: No family history on file.  Physical Examination: Vitals:   08/17/23 1313  BP: 110/74    Awake, alert, oriented to person, place, and time.  Speech is clear and fluent. Fund of knowledge is appropriate.   Cranial Nerves: Pupils equal round and reactive to light.  Facial tone is symmetric.    Diffuse lower lumbar tenderness. Mild tenderness TL junction as well that is diffuse.   No abnormal lesions on exposed skin.   Strength:  Side Iliopsoas Quads Hamstring PF DF EHL  R 5 5 5 5 5 5   L 5 5 5 5 5 5    Reflexes are 2+ and symmetric at the patella and achilles.    Clonus is not present.   Bilateral lower extremity sensation is intact to light touch.     She ambulates with a walking stick.    Medical Decision Making  Imaging: none   Assessment and Plan: Brittany Cobb continues with constant LBP along with more constant bilateral leg pain posterior to her feet. The numbness and tingling in her legs has improved. She has weakness in her legs.    She has history of L1 compression fracture s/p fall on 08/12/22. She had L1 kyphoplasty by Dr. Juliette Alcide on 08/20/22.   She also has known known  slip L4-L5 with facet hypertrophy and severe central stenosis with moderate/severe bilateral foraminal stenosis.   She has lost over 30 pounds!  Treatment options discussed with patient and following plan made:   - Follow up in 4 weeks for weight check. Once she loses at least 20 more pounds (weight goat is 250-260 lbs), will consider updating her imaging.  - She will need updated lumbar MRI and lumbar xrays with flex/ext.  - She states she is able to have lumbar MRI. No known contraindications.  - No improvement with HHPT last year. Notes under  media.  - Will have her f/u with Dr. Myer Haff with her updated imaging.   I spent a total of 20 minutes in face-to-face and non-face-to-face activities related to this patient's  care today including review of outside records, review of imaging, review of symptoms, physical exam, discussion of differential diagnosis, discussion of treatment options, and documentation.   Drake Leach PA-C Dept. of Neurosurgery

## 2023-08-17 ENCOUNTER — Ambulatory Visit (INDEPENDENT_AMBULATORY_CARE_PROVIDER_SITE_OTHER): Payer: Medicaid Other | Admitting: Orthopedic Surgery

## 2023-08-17 ENCOUNTER — Encounter: Payer: Self-pay | Admitting: Orthopedic Surgery

## 2023-08-17 VITALS — BP 110/74 | Ht 68.0 in | Wt 302.0 lb

## 2023-08-17 DIAGNOSIS — M4316 Spondylolisthesis, lumbar region: Secondary | ICD-10-CM | POA: Diagnosis not present

## 2023-08-17 DIAGNOSIS — W19XXXD Unspecified fall, subsequent encounter: Secondary | ICD-10-CM

## 2023-08-17 DIAGNOSIS — M5416 Radiculopathy, lumbar region: Secondary | ICD-10-CM

## 2023-08-17 DIAGNOSIS — M48061 Spinal stenosis, lumbar region without neurogenic claudication: Secondary | ICD-10-CM | POA: Diagnosis not present

## 2023-08-17 DIAGNOSIS — S32010D Wedge compression fracture of first lumbar vertebra, subsequent encounter for fracture with routine healing: Secondary | ICD-10-CM

## 2023-08-17 DIAGNOSIS — S32010S Wedge compression fracture of first lumbar vertebra, sequela: Secondary | ICD-10-CM

## 2023-08-17 DIAGNOSIS — M4726 Other spondylosis with radiculopathy, lumbar region: Secondary | ICD-10-CM | POA: Diagnosis not present

## 2023-08-17 DIAGNOSIS — M47816 Spondylosis without myelopathy or radiculopathy, lumbar region: Secondary | ICD-10-CM

## 2023-09-02 NOTE — Progress Notes (Signed)
 Referring Physician:  [name], DO [address] [place],  [place] [address]  Primary Physician:  [name], DO  History of Present Illness: [name] has a history of GERD, HTN, obesity, bipolar, migraines, mixed restrictive/obstructive lung disease.   Last seen by me on 08/17/23- she has known L1 compression fracture s/p fall on 08/12/22. She had L1 kyphoplasty by Dr. Juliette Alcide on 08/20/22. She also has slip L4-L5 with facet hypertrophy and severe central stenosis with moderate/severe bilateral foraminal stenosis.   She has been working on weight loss. She has lost 10 more pounds since her last visit. She is down to 293 lbs.   She had bilateral L5-S1 TF ESI on 07/09/23 with Dr. Mariah Milling. She did not recommend further injections.   She is here for follow up.   She continues with constant LBP along with more constant bilateral leg pain posterior to her feet. This is worse with prolonged standing and walking. The numbness and tingling in her legs has continue to improve with weight loss, but is still present. She has weakness in her legs.   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   She quit smoking in 2020.   Bowel/Bladder Dysfunction: some urinary urgency, no issues with bowels. No perineal numbness.   Conservative measures:  Physical therapy: did HHPT with [facility] from 08/31/22 until 12/08/22 Multimodal medical therapy including regular antiinflammatories: tylenol, norco, zanaflex, neurontin Injections:  bilateral L5-S1 TF ESI on 07/09/23 bilateral L5-S1 TF ESI on 01/22/23 with no improvement  Past Surgery:  L1 kyphoplasty by Dr. Juliette Alcide on 08/20/22   The symptoms are causing a significant impact on the patient's life.   Review of Systems:  A 10 point review of systems is negative, except for the pertinent positives and negatives detailed in the HPI.  Past Medical History: Past Medical History:  Diagnosis Date   Acid reflux    Anxiety    Depression      Past Surgical History: Past Surgical History:  Procedure Laterality Date   BREAST BIOPSY Left 2014   benign with s shaped clip   ENDOMETRIAL ABLATION     IR KYPHO LUMBAR INC FX REDUCE BONE BX UNI/BIL CANNULATION INC/IMAGING  08/20/2022   KNEE SURGERY Left    TONSILLECTOMY     TUBAL LIGATION      Allergies: Allergies as of 09/14/2023 - Review Complete 09/14/2023  Allergen Reaction Noted   Bactrim [sulfamethoxazole-trimethoprim] Hives 03/26/2015    Medications: Outpatient Encounter Medications as of 09/14/2023  Medication Sig   amLODipine-valsartan (EXFORGE) 5-160 MG tablet Take 1 tablet by mouth daily.   ARIPiprazole (ABILIFY) 5 MG tablet Take 5 mg by mouth daily.   atorvastatin (LIPITOR) 20 MG tablet Take 20 mg by mouth daily.   budesonide-formoterol (SYMBICORT) 160-4.5 MCG/ACT inhaler Inhale 2 puffs into the lungs 2 (two) times daily.   furosemide (LASIX) 40 MG tablet Take 1 tablet (40 mg total) by mouth daily.   gabapentin (NEURONTIN) 300 MG capsule Take 300 mg by mouth 3 (three) times daily.   HYDROcodone-acetaminophen (NORCO) 10-325 MG tablet Take 1-2 tablets by mouth as needed.   ketoconazole (NIZORAL) 2 % cream SMARTSIG:1 Application Topical 1 to 2 Times Daily   lamoTRIgine (LAMICTAL) 200 MG tablet Take 200 mg by mouth at bedtime.   montelukast (SINGULAIR) 10 MG tablet SINGULAIR 10 MG TABS   nystatin (MYCOSTATIN/NYSTOP) powder Apply to affected area 3 times daily   omeprazole (PRILOSEC) 20 MG capsule  Take 20 mg by mouth in the morning and at bedtime.   ondansetron (ZOFRAN-ODT) 4 MG disintegrating tablet Dissolve 1 tablet (4 mg total) in the mouth every eight (8) hours as needed for nausea.   polyethylene glycol (GOLYTELY) 236 g solution Take by mouth as directed per [facility] GI prep instructions, for split bowel prep.   potassium chloride SA (KLOR-CON M) 20 MEQ tablet Take 1 tablet (20 mEq total) by mouth daily.   Semaglutide-Weight Management 1.7 MG/0.75ML SOAJ Inject into  the skin.   tiZANidine (ZANAFLEX) 2 MG tablet Take 1 tablet p.o. nightly as needed muscle spasms   topiramate (TOPAMAX) 50 MG tablet Take 50 mg by mouth 2 (two) times daily.   traZODone (DESYREL) 50 MG tablet Take 50 mg by mouth at bedtime.   valACYclovir (VALTREX) 1000 MG tablet Take 1,000 mg by mouth daily.   venlafaxine XR (EFFEXOR-XR) 150 MG 24 hr capsule Take 1 capsule by mouth daily.   VENTOLIN HFA 108 (90 Base) MCG/ACT inhaler    No facility-administered encounter medications on file as of 09/14/2023.    Social History: Social History   Tobacco Use   Smoking status: Former    Current packs/day: 0.00    Types: Cigarettes    Quit date: 09/2018    Years since quitting: 4.9   Smokeless tobacco: Never  Substance Use Topics   Alcohol use: No   Drug use: No    Family Medical History: No family history on file.  Physical Examination: Vitals:   09/14/23 1316  BP: 120/78     Awake, alert, oriented to person, place, and time.  Speech is clear and fluent. Fund of knowledge is appropriate.   Cranial Nerves: Pupils equal round and reactive to light.  Facial tone is symmetric.    No abnormal lesions on exposed skin.   Strength:  Side Iliopsoas Quads Hamstring PF DF EHL  R 5 5 5 5 5 5   L 5 5 5 5 5 5    Reflexes are 2+ and symmetric at the patella and achilles.    Clonus is not present.   Bilateral lower extremity sensation is intact to light touch.     She ambulates with a walking stick.    Medical Decision Making  Imaging: none   Assessment and Plan: [name] continues with constant LBP along with more constant bilateral leg pain posterior to her feet. This is worse with prolonged standing and walking.   She has history of L1 compression fracture s/p fall on 08/12/22. She had L1 kyphoplasty by Dr. Juliette Alcide on 08/20/22.   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    Treatment options discussed with patient and following plan made:   - She has f/u with PCP on 11/16/23. She will message me with weight. If she has lost at least 10 more pounds then will order imaging(weight goat is 250-260 lbs). - She will need updated lumbar MRI and lumbar xrays with flex/ext.  - She states she is able to have lumbar MRI. No known contraindications. Would need to remove piercings. Would need to do WIDE BORE MRI in [place].  - No improvement with HHPT last year. Notes under  media.  - Will have her f/u with Dr. Myer Haff after her updated imaging.   I spent a total of 20 minutes in face-to-face and non-face-to-face activities related to this patient's care today including review of outside records, review of  imaging, review of symptoms, physical exam, discussion of differential diagnosis, discussion of treatment options, and documentation.   Drake Leach PA-C Dept. of Neurosurgery

## 2023-09-14 ENCOUNTER — Encounter: Payer: Self-pay | Admitting: Orthopedic Surgery

## 2023-09-14 ENCOUNTER — Ambulatory Visit (INDEPENDENT_AMBULATORY_CARE_PROVIDER_SITE_OTHER): Payer: Medicaid Other | Admitting: Orthopedic Surgery

## 2023-09-14 VITALS — BP 120/78 | Ht 68.0 in | Wt 293.0 lb

## 2023-09-14 DIAGNOSIS — M4726 Other spondylosis with radiculopathy, lumbar region: Secondary | ICD-10-CM

## 2023-09-14 DIAGNOSIS — S32010D Wedge compression fracture of first lumbar vertebra, subsequent encounter for fracture with routine healing: Secondary | ICD-10-CM | POA: Diagnosis not present

## 2023-09-14 DIAGNOSIS — W19XXXD Unspecified fall, subsequent encounter: Secondary | ICD-10-CM

## 2023-09-14 DIAGNOSIS — M48061 Spinal stenosis, lumbar region without neurogenic claudication: Secondary | ICD-10-CM

## 2023-09-14 DIAGNOSIS — M5126 Other intervertebral disc displacement, lumbar region: Secondary | ICD-10-CM

## 2023-09-14 DIAGNOSIS — M4316 Spondylolisthesis, lumbar region: Secondary | ICD-10-CM

## 2023-09-14 DIAGNOSIS — M5416 Radiculopathy, lumbar region: Secondary | ICD-10-CM

## 2023-09-14 DIAGNOSIS — S32010S Wedge compression fracture of first lumbar vertebra, sequela: Secondary | ICD-10-CM

## 2023-09-14 DIAGNOSIS — M47816 Spondylosis without myelopathy or radiculopathy, lumbar region: Secondary | ICD-10-CM

## 2023-09-14 NOTE — Patient Instructions (Signed)
 It is always good to see you!  We can order updated imaging once you lose at least 10 more pounds. You are at 293 pounds. Goal is to be at 250-260 pounds for surgery.   After you see your PCP on 11/16/23, let me know your weight.   We will plan to order a lumbar MRI scan. Can do wide bore MRI in [place]. You will need to remove your piercings. Will get updated xrays as well.   After this, will have you see Dr. Myer Haff to discuss surgery options.   Keep it up, you are doing amazing!!!!!  Please do not hesitate to call if you have any questions or concerns. You can also message me in MyChart.   Drake Leach PA-C [phone]

## 2023-11-10 LAB — COLOGUARD: COLOGUARD: NEGATIVE

## 2023-11-17 DIAGNOSIS — M48061 Spinal stenosis, lumbar region without neurogenic claudication: Secondary | ICD-10-CM

## 2023-11-17 DIAGNOSIS — M4316 Spondylolisthesis, lumbar region: Secondary | ICD-10-CM

## 2023-11-17 DIAGNOSIS — M5416 Radiculopathy, lumbar region: Secondary | ICD-10-CM

## 2023-11-17 DIAGNOSIS — M47816 Spondylosis without myelopathy or radiculopathy, lumbar region: Secondary | ICD-10-CM

## 2023-11-17 DIAGNOSIS — S32010S Wedge compression fracture of first lumbar vertebra, sequela: Secondary | ICD-10-CM

## 2023-11-19 NOTE — Progress Notes (Signed)
 [facility] [address] [place], [place] [address]  Pulmonary Sleep Medicine   Office Visit Note  Patient Name: Brittany Cobb DOB: 27-Aug-1970 MRN [id number]    Chief Complaint: Obstructive Sleep Apnea visit  Brief History:  Matylda is seen today for an initial consult for APAP@ 6-14 cmH2O. The patient has a 6 month history of sleep apnea. Patient is using PAP nightly.  The patient feels rested after sleeping with PAP.  The patient reports benefiting from PAP use. Reported sleepiness is  improved and the Epworth Sleepiness Score is 4 out of 24. The patient does not take naps. The patient complains of the following: none.  The compliance download shows 75% compliance with an average use time of 6 hours 29 minutes. The AHI is 1.5.  The patient occasional restlessness of limb movements disrupting sleep. The patient continues to require PAP therapy in order to eliminate sleep apnea.   ROS  General: (-) fever, (-) chills, (-) night sweat Nose and Sinuses: (-) nasal stuffiness or itchiness, (-) postnasal drip, (-) nosebleeds, (-) sinus trouble. Mouth and Throat: (-) sore throat, (-) hoarseness. Neck: (-) swollen glands, (-) enlarged thyroid, (-) neck pain. Respiratory: - cough, - shortness of breath, - wheezing. Neurologic: + numbness, - tingling. Psychiatric: + anxiety, + depression   Current Medication: Outpatient Encounter Medications as of 11/22/2023  Medication Sig   amLODipine  (NORVASC ) 5 MG tablet SMARTSIG:1 Tablet(s) By Mouth Every Evening   hydrOXYzine (VISTARIL) 25 MG capsule TAKE 1 CAPSULE BY MOUTH AT BEDTIME AS NEEDED FOR ANXIETY AND SLEEP   valsartan (DIOVAN) 160 MG tablet Take 1 tablet by mouth daily.   WEGOVY 2.4 MG/0.75ML SOAJ Inject 2.4 mg into the skin once a week.   ARIPiprazole (ABILIFY) 5 MG tablet Take 5 mg by mouth daily.   atorvastatin (LIPITOR) 20 MG tablet Take 20 mg by mouth daily.   budesonide-formoterol  (SYMBICORT) 160-4.5 MCG/ACT inhaler Inhale  2 puffs into the lungs 2 (two) times daily.   furosemide  (LASIX ) 40 MG tablet Take 1 tablet (40 mg total) by mouth daily.   gabapentin  (NEURONTIN ) 300 MG capsule Take 300 mg by mouth 3 (three) times daily.   HYDROcodone -acetaminophen  (NORCO) 10-325 MG tablet Take 1-2 tablets by mouth as needed.   ketoconazole (NIZORAL) 2 % cream SMARTSIG:1 Application Topical 1 to 2 Times Daily   lamoTRIgine  (LAMICTAL ) 200 MG tablet Take 200 mg by mouth at bedtime.   montelukast (SINGULAIR) 10 MG tablet SINGULAIR 10 MG TABS   nystatin  (MYCOSTATIN /NYSTOP ) powder Apply to affected area 3 times daily   omeprazole (PRILOSEC) 20 MG capsule Take 20 mg by mouth in the morning and at bedtime.   ondansetron  (ZOFRAN -ODT) 4 MG disintegrating tablet Dissolve 1 tablet (4 mg total) in the mouth every eight (8) hours as needed for nausea.   polyethylene glycol (GOLYTELY ) 236 g solution Take by mouth as directed per [facility] GI prep instructions, for split bowel prep.   potassium chloride  SA (KLOR-CON  M) 20 MEQ tablet Take 1 tablet (20 mEq total) by mouth daily.   tiZANidine  (ZANAFLEX ) 2 MG tablet Take 1 tablet p.o. nightly as needed muscle spasms   topiramate  (TOPAMAX ) 50 MG tablet Take 50 mg by mouth 2 (two) times daily.   traZODone  (DESYREL ) 50 MG tablet Take 50 mg by mouth at bedtime.   valACYclovir  (VALTREX ) 1000 MG tablet Take 1,000 mg by mouth daily.   venlafaxine  XR (EFFEXOR -XR) 150 MG 24 hr capsule Take 1 capsule by mouth daily.  VENTOLIN  HFA 108 (90 Base) MCG/ACT inhaler    [DISCONTINUED] amLODipine -valsartan (EXFORGE) 5-160 MG tablet Take 1 tablet by mouth daily.   [DISCONTINUED] Semaglutide-Weight Management 1.7 MG/0.75ML SOAJ Inject into the skin.   No facility-administered encounter medications on file as of 11/22/2023.    Surgical History: Past Surgical History:  Procedure Laterality Date   BREAST BIOPSY Left 2014   benign with s shaped clip   ENDOMETRIAL ABLATION     IR KYPHO LUMBAR INC FX REDUCE BONE BX  UNI/BIL CANNULATION INC/IMAGING  08/20/2022   KNEE SURGERY Left    TONSILLECTOMY     TUBAL LIGATION      Medical History: Past Medical History:  Diagnosis Date   Acid reflux    Anxiety    Depression     Family History: Non contributory to the present illness  Social History: Social History   Socioeconomic History   Marital status: Divorced    Spouse name: Not on file   Number of children: Not on file   Years of education: Not on file   Highest education level: Not on file  Occupational History   Not on file  Tobacco Use   Smoking status: Former    Current packs/day: 0.00    Types: Cigarettes    Quit date: 09/2018    Years since quitting: 5.1   Smokeless tobacco: Never  Substance and Sexual Activity   Alcohol use: No   Drug use: No   Sexual activity: Not Currently  Other Topics Concern   Not on file  Social History Narrative   Not on file   Social Drivers of Health   Financial Resource Strain: High Risk (09/15/2023)   Received from [facility]   Overall Financial Resource Strain (CARDIA)    Difficulty of Paying Living Expenses: Very hard  Food Insecurity: Food Insecurity Present (09/15/2023)   Received from [facility]   Hunger Vital Sign    Worried About Running Out of Food in the Last Year: Sometimes true    Ran Out of Food in the Last Year: Sometimes true  Transportation Needs: No Transportation Needs (09/15/2023)   Received from [facility]   PRAPARE - Transportation    Lack of Transportation (Medical): No    Lack of Transportation (Non-Medical): No  Physical Activity: Inactive (09/15/2023)   Received from [facility]   Exercise Vital Sign    Days of Exercise per Week: 0 days    Minutes of Exercise per Session: 0 min  Stress: Stress Concern Present (09/15/2023)   Received from [facility] - Occupational Stress Questionnaire    Feeling of Stress : Rather much  Social Connections: Socially  Isolated (09/15/2023)   Received from [facility]   Social Connection and Isolation Panel [NHANES]    Frequency of Communication with Friends and Family: Once a week    Frequency of Social Gatherings with Friends and Family: Never    Attends Religious Services: Never    Database administrator or Organizations: No    Attends Banker Meetings: Never    Marital Status: Divorced  Catering manager Violence: Not At Risk (09/15/2023)   Received from [facility]   Humiliation, Afraid, Rape, and Kick questionnaire    Fear of Current or Ex-Partner: No    Emotionally Abused: No    Physically Abused: No    Sexually Abused: No    Vital Signs: Blood pressure  107/77, pulse 74, resp. rate 16, height 5\' 6"  (1.676 m), weight 275 lb (124.7 kg), last menstrual period 03/18/2015, SpO2 98%. Body mass index is 44.39 kg/m.    Examination: General Appearance: The patient is well-developed, well-nourished, and in no distress. Neck Circumference: 45 cm Skin: Gross inspection of skin unremarkable. Head: normocephalic, no gross deformities. Eyes: no gross deformities noted. ENT: ears appear grossly normal Neurologic: Alert and oriented. No involuntary movements.  STOP BANG RISK ASSESSMENT S (snore) Have you been told that you snore?     NO   T (tired) Are you often tired, fatigued, or sleepy during the day?   NO  O (obstruction) Do you stop breathing, choke, or gasp during sleep? NO   P (pressure) Do you have or are you being treated for high blood pressure? YES   B (BMI) Is your body index greater than 35 kg/m? YES   A (age) Are you 73 years old or older? YES   N (neck) Do you have a neck circumference greater than 16 inches?   YES   G (gender) Are you a female? NO   TOTAL STOP/BANG "YES" ANSWERS 4       A STOP-Bang score of 2 or less is considered low risk, and a score of 5 or more is high risk for having either moderate or severe OSA. For people who score 3 or 4, doctors  may need to perform further assessment to determine how likely they are to have OSA.         EPWORTH SLEEPINESS SCALE:  Scale:  (0)= no chance of dozing; (1)= slight chance of dozing; (2)= moderate chance of dozing; (3)= high chance of dozing  Chance  Situtation    Sitting and reading: 0    Watching TV: 0    Sitting Inactive in public: 0    As a passenger in car: 0      Lying down to rest: 2    Sitting and talking: 0    Sitting quielty after lunch: 2    In a car, stopped in traffic: 0   TOTAL SCORE:   4 out of 24    SLEEP STUDIES:  HST (05/2023) AHI 31/hr, min SpO2 77% Titration (07/2023) APAP@ 6-14 cmH2O   CPAP COMPLIANCE DATA:  Date Range: 09/20/2023-11/18/2023  Average Daily Use: 6 hours 29 minutes  Median Use: 6 hours 43 minutes  Compliance for > 4 Hours: 75%  AHI: 1.5 respiratory events per hour  Days Used: 57/60 days  Mask Leak: 0.5  95th Percentile Pressure: 12.3         LABS: Recent Results (from the past 2160 hours)  COLOGUARD     Status: Normal   Collection Time: 11/03/23 11:20 AM  Result Value Ref Range   COLOGUARD Negative Negative    Comment:  The Cologuard (TM) test was performed on this specimen.  NEGATIVE TEST RESULT. A negative Cologuard result indicates a low likelihood that a colorectal cancer (CRC) or advanced adenoma (adenomatous polyps with more advanced pre-malignant features) is present. The chance that a person with a negative Cologuard test has a colorectal cancer is less than 1 in 1500 (negative predictive value >99.9%) or has an advanced adenoma is less than 5.3% (negative predictive value 94.7%). These data are based on a prospective cross-sectional study of 10,000 individuals at average risk for colorectal cancer who were screened with both Cologuard and colonoscopy. (Imperiale T. et al, N Engl J Med 2014;370(14):1286-1297) The normal value (reference  range) for this assay is negative.  COLOGUARD RE-SCREENING  RECOMMENDATION: Periodic colorectal cancer screening is an important part of preventive healthcare for asymptomatic individuals at average risk for colorectal cancer. Following a negative Cologuard result, the American Cancer Society and U.S. Multi-Society Task Force screening guidelines recommend a Cologuard re-screening interval of 3 years.  References: American Cancer Society Guideline for Colorectal Cancer Screening: https://www.cancer.org/cancer/colon-rectal-cancer/detection-diagnosis-staging/acs-recommendations.html.; Rex DK, Boland CR, Dominitz JK, Colorectal Cancer Screening: Recommendations for Physicians and Patients from the U.S. Multi-Society Task Force on Colorectal Cancer Screening , Am J Gastroenterology 2017; 112:1016-1030.  TEST DESCRIPTION: Composite algorithmic analysis of stool DNA-biomarkers with hemoglobin immunoassay.   Quantitative values of individual biomarkers are not reportable and are not associated with individual biomarker result reference ranges. Cologuard is intended for colorectal cancer screening of adults of either sex, 45 years or older, who are at average-risk for colorectal cancer (CRC). Cologuard has been approved for use by the U.S. FDA. The performance of Cologuard was established in a cross sectional study of average-risk adults aged 66-84. Cologuard performance in patients ages 3 to 59 years was estimated by sub-group analysis of near-age groups. Colonoscopies performed for a positive result may find as the most clinically significant lesion: colorectal cancer [4.0%], advanced adenoma (including sessile serrated polyps greater than or equal to 1cm diameter) [20%] or non- advanced adenoma [31%]; or no colorectal neoplasia [45%]. These estimates are derived from a prospective  cross-sectional screening study of 10,000 individuals at average risk for colorectal cancer who were screened with both Cologuard and colonoscopy. (Imperiale T. et al, Ole [place] J Med  2014;370(14):1286-1297.) Cologuard may produce a false negative or false positive result (no colorectal cancer or precancerous polyp present at colonoscopy follow up). A negative Cologuard test result does not guarantee the absence of CRC or advanced adenoma (pre-cancer). The current Cologuard screening interval is every 3 years. Science writer and U.[place]. Therapist, music). Cologuard performance data in a 10,000 patient pivotal study using colonoscopy as the reference method can be accessed at the following location: www.exactlabs.com/results. Additional description of the Cologuard test process, warnings and precautions can be found at www.cologuard.com.    Radiology: CT Thoracic Spine Wo Contrast Result Date: 08/11/2022 CLINICAL DATA:  Back pain after fall with known L1 vertebral body compression fracture EXAM: CT THORACIC SPINE WITHOUT CONTRAST TECHNIQUE: Multidetector CT images of the thoracic were obtained using the standard protocol without intravenous contrast. RADIATION DOSE REDUCTION: This exam was performed according to the departmental dose-optimization program which includes automated exposure control, adjustment of the mA and/or kV according to patient size and/or use of iterative reconstruction technique. COMPARISON:  Same day lumbar spine CT FINDINGS: Alignment: Preserved thoracic kyphosis without static listhesis. Vertebrae: Acute superior endplate compression fracture of the L1 vertebral body, as described on dedicated CT of the lumbar spine. Thoracic vertebral body heights are maintained without evidence of fracture. No pathologic bone process. Paraspinal and other soft tissues: Negative. Disc levels: Thoracic intervertebral disc heights are preserved. Minimal facet joint hypertrophy within the lower thoracic spine. IMPRESSION: 1. No acute osseous abnormality of the thoracic spine. 2. Acute superior endplate compression fracture of the L1 vertebral body, as described on  dedicated CT of the lumbar spine. Electronically Signed   By: Leverne Reading D.O.   On: 08/11/2022 16:08   CT Knee Right Wo Contrast Result Date: 08/11/2022 CLINICAL DATA:  Knee trauma, no prior imaging (Age >= 5y) Unable to ambulate EXAM: CT OF THE RIGHT KNEE WITHOUT CONTRAST TECHNIQUE: Multidetector CT  imaging of the right knee was performed according to the standard protocol. Multiplanar CT image reconstructions were also generated. RADIATION DOSE REDUCTION: This exam was performed according to the departmental dose-optimization program which includes automated exposure control, adjustment of the mA and/or kV according to patient size and/or use of iterative reconstruction technique. COMPARISON:  None Available. FINDINGS: Bones/Joint/Cartilage There is no evidence of acute fracture. There is tricompartment osteophyte formation with moderate-severe medial compartment joint space narrowing. There is a trace joint effusion. There is a small ossified joint body in the superolateral knee measuring 6 mm. Ligaments Suboptimally assessed by CT. Muscles and Tendons No acute myotendinous abnormality by CT. Soft tissues No focal fluid collection. IMPRESSION: No evidence of acute fracture. 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 Trace joint effusion. Electronically Signed   By: Jacob  Kahn M.D.   On: 08/11/2022 14:09   CT Lumbar Spine Wo Contrast Result Date: 08/11/2022 CLINICAL DATA:  Back pain. EXAM: CT LUMBAR SPINE WITHOUT CONTRAST TECHNIQUE: Multidetector CT imaging of the lumbar spine was performed without intravenous contrast administration. Multiplanar CT image reconstructions were also generated. RADIATION DOSE REDUCTION: This exam was performed according to the departmental dose-optimization program which includes automated exposure control, adjustment of the mA and/or kV according to patient size and/or use of iterative reconstruction technique. COMPARISON:  None Available.  FINDINGS: Segmentation: 5 lumbar type vertebrae. Alignment: Trace anterolisthesis of L4 on L5. Vertebrae: There is an acute appearing superior endplate compression deformity at L1. There is no evidence of retropulsion. There is a proximally 40% height loss. No evidence of an epidural hematoma. Paraspinal and other soft tissues: Small left interpolar renal stone. No hydronephrosis. Disc levels: Moderate spinal canal stenosis at L4-L5 secondary to combination of a small disc bulge and ligamentum flavum hypertrophy. There is also moderate right-sided neural foraminal stenosis at L4-L5. IMPRESSION: 1. Acute appearing superior endplate compression deformity at L1 with approximately 40% height loss. No evidence of retropulsion. No evidence of an epidural hematoma. 2. Moderate spinal canal stenosis at L4-L5 secondary to combination of a small disc bulge and ligamentum flavum hypertrophy. Moderate right-sided neural foraminal stenosis at L4-L5. Electronically Signed   By: Clora Dane M.D.   On: 08/11/2022 14:00    No results found.  No results found.    Assessment and Plan: Patient Active Problem List   Diagnosis Date Noted   OSA (obstructive sleep apnea) 11/22/2023   CPAP use counseling 11/22/2023   Morbid obesity (HCC) 11/22/2023   Compression fracture of L1 lumbar vertebra, closed, initial encounter (HCC) 08/12/2022   Inadequate pain control 08/11/2022   Obesity, Class III, BMI 40-49.9 (morbid obesity) 08/11/2022   Essential hypertension 08/11/2022   GERD (gastroesophageal reflux disease) 08/11/2022   Anxiety 08/11/2022   Grieving 08/11/2022   Bipolar disorder with severe depression (HCC) 02/28/2016   Mixed anxiety depressive disorder 02/20/2009   1. OSA (obstructive sleep apnea) (Primary) The patient does tolerate PAP and reports  benefit from PAP use. The patient was reminded how to clean equipment and advised to replace supplies routinely. The patient was also counselled on weight loss. She  is interested in retesting when she loses more weight. . The compliance is fair. The AHI is 1.5.   OSA on cpap- controlled. Continue with compliance with pap. CPAP continues to be medically necessary to treat this patient's OSA. F/u 83m  2. CPAP use counseling CPAP Counseling: had a lengthy discussion with the patient regarding the importance of PAP therapy in management of the sleep  apnea. Patient appears to understand the risk factor reduction and also understands the risks associated with untreated sleep apnea. Patient will try to make a good faith effort to remain compliant with therapy. Also instructed the patient on proper cleaning of the device including the water must be changed daily if possible and use of distilled water is preferred. Patient understands that the machine should be regularly cleaned with appropriate recommended cleaning solutions that do not damage the PAP machine for example given white vinegar and water rinses. Other methods such as ozone treatment may not be as good as these simple methods to achieve cleaning.   3. Morbid obesity (HCC) Obesity Counseling: Had a lengthy discussion regarding patients BMI and weight issues. Patient was instructed on portion control as well as increased activity. Also discussed caloric restrictions with trying to maintain intake less than 2000 Kcal. Discussions were made in accordance with the 5As of weight management. Simple actions such as not eating late and if able to, taking a walk is suggested.      General Counseling: I have discussed the findings of the evaluation and examination with Archie Bearded.  I have also discussed any further diagnostic evaluation thatmay be needed or ordered today. Candelaria verbalizes understanding of the findings of todays visit. We also reviewed her medications today and discussed drug interactions and side effects including but not limited excessive drowsiness and altered mental states. We also discussed that there is  always a risk not just to her but also people around her. she has been encouraged to call the office with any questions or concerns that should arise related to todays visit.  No orders of the defined types were placed in this encounter.       I have personally obtained a history, examined the patient, evaluated laboratory and imaging results, formulated the assessment and plan and placed orders. This patient was seen today by Louann Rous, PA-C in collaboration with Dr. Cam Cava.   Cordie Deters, MD [facility] Diplomate ABMS Pulmonary Critical Care Medicine and Sleep Medicine

## 2023-11-22 ENCOUNTER — Ambulatory Visit (INDEPENDENT_AMBULATORY_CARE_PROVIDER_SITE_OTHER): Payer: Self-pay | Admitting: Internal Medicine

## 2023-11-22 VITALS — BP 107/77 | HR 74 | Resp 16 | Ht 66.0 in | Wt 275.0 lb

## 2023-11-22 DIAGNOSIS — Z7189 Other specified counseling: Secondary | ICD-10-CM | POA: Diagnosis not present

## 2023-11-22 DIAGNOSIS — G4733 Obstructive sleep apnea (adult) (pediatric): Secondary | ICD-10-CM | POA: Diagnosis not present

## 2023-11-22 NOTE — Addendum Note (Signed)
 Addended byLucetta Russel on: 11/22/2023 09:08 AM   Modules accepted: Orders

## 2023-11-22 NOTE — Patient Instructions (Signed)

## 2023-12-03 DIAGNOSIS — M5416 Radiculopathy, lumbar region: Secondary | ICD-10-CM

## 2023-12-03 DIAGNOSIS — M4316 Spondylolisthesis, lumbar region: Secondary | ICD-10-CM

## 2023-12-03 DIAGNOSIS — S32010S Wedge compression fracture of first lumbar vertebra, sequela: Secondary | ICD-10-CM

## 2023-12-03 DIAGNOSIS — M48061 Spinal stenosis, lumbar region without neurogenic claudication: Secondary | ICD-10-CM

## 2023-12-03 DIAGNOSIS — M47816 Spondylosis without myelopathy or radiculopathy, lumbar region: Secondary | ICD-10-CM

## 2024-03-26 NOTE — Progress Notes (Deleted)
 Referring Physician:  Towana Pride, DO [address] [place],  [place] [address]  Primary Physician:  Towana Pride, DO  History of Present Illness: [name] has a history of GERD, HTN, obesity, bipolar, migraines, mixed restrictive/obstructive lung disease.   Last seen by me on 09/14/23- she has known L1 compression fracture s/p fall on 08/12/22. She had L1 kyphoplasty by Dr. Gar on 08/20/22. She also has slip L4-L5 with facet hypertrophy and severe central stenosis with moderate/severe bilateral foraminal stenosis.   I ordered lumbar MRI at her last visit and insurance denied as she has not done PT recently. She had been working on weight loss with a goal of 250-260lbs.   PT was ordered and she is here for follow up.      She has been working on weight loss. She has lost 10 more pounds since her last visit. She is down to 293 lbs.   She had bilateral L5-S1 TF ESI on 07/09/23 with Dr. Dodson. She did not recommend further injections.   She is here for follow up.   She continues with constant LBP along with more constant bilateral leg pain posterior to her feet. This is worse with prolonged standing and walking. The numbness and tingling in her legs has continue to improve with weight loss, but is still present. She has weakness in her legs.   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   She quit smoking in 2020.   Bowel/Bladder Dysfunction: some urinary urgency, no issues with bowels. No perineal numbness.   Conservative measures:  Physical therapy: did HHPT with [facility] from 08/31/22 until 12/08/22 Multimodal medical therapy including regular antiinflammatories: tylenol , norco, zanaflex , neurontin  Injections:  bilateral L5-S1 TF ESI on 07/09/23 bilateral L5-S1 TF ESI on 01/22/23 with no improvement  Past Surgery:  L1 kyphoplasty by Dr. Gar on 08/20/22   The symptoms are causing a significant impact on the patient's life.   Review of Systems:  A  10 point review of systems is negative, except for the pertinent positives and negatives detailed in the HPI.  Past Medical History: Past Medical History:  Diagnosis Date   Acid reflux    Anxiety    Depression     Past Surgical History: Past Surgical History:  Procedure Laterality Date   BREAST BIOPSY Left 2014   benign with s shaped clip   ENDOMETRIAL ABLATION     IR KYPHO LUMBAR INC FX REDUCE BONE BX UNI/BIL CANNULATION INC/IMAGING  08/20/2022   KNEE SURGERY Left    TONSILLECTOMY     TUBAL LIGATION      Allergies: Allergies as of 03/29/2024 - Review Complete 11/22/2023  Allergen Reaction Noted   Bactrim [sulfamethoxazole-trimethoprim] Hives 03/26/2015    Medications: Outpatient Encounter Medications as of 03/29/2024  Medication Sig   amLODipine  (NORVASC ) 5 MG tablet SMARTSIG:1 Tablet(s) By Mouth Every Evening   ARIPiprazole (ABILIFY) 5 MG tablet Take 5 mg by mouth daily.   atorvastatin (LIPITOR) 20 MG tablet Take 20 mg by mouth daily.   budesonide-formoterol  (SYMBICORT) 160-4.5 MCG/ACT inhaler Inhale 2 puffs into the lungs 2 (two) times daily.   furosemide  (LASIX ) 40 MG tablet Take 1 tablet (40 mg total) by mouth daily.   gabapentin  (NEURONTIN ) 300 MG capsule Take 300 mg by mouth 3 (three) times daily.   HYDROcodone -acetaminophen  (NORCO) 10-325 MG tablet Take 1-2 tablets by mouth as needed.   hydrOXYzine (VISTARIL) 25 MG capsule TAKE 1 CAPSULE BY MOUTH  AT BEDTIME AS NEEDED FOR ANXIETY AND SLEEP   ketoconazole (NIZORAL) 2 % cream SMARTSIG:1 Application Topical 1 to 2 Times Daily   lamoTRIgine  (LAMICTAL ) 200 MG tablet Take 200 mg by mouth at bedtime.   montelukast (SINGULAIR) 10 MG tablet SINGULAIR 10 MG TABS   nystatin  (MYCOSTATIN /NYSTOP ) powder Apply to affected area 3 times daily   omeprazole (PRILOSEC) 20 MG capsule Take 20 mg by mouth in the morning and at bedtime.   ondansetron  (ZOFRAN -ODT) 4 MG disintegrating tablet Dissolve 1 tablet (4 mg total) in the mouth every  eight (8) hours as needed for nausea.   polyethylene glycol (GOLYTELY ) 236 g solution Take by mouth as directed per [facility] GI prep instructions, for split bowel prep.   potassium chloride  SA (KLOR-CON  M) 20 MEQ tablet Take 1 tablet (20 mEq total) by mouth daily.   tiZANidine  (ZANAFLEX ) 2 MG tablet Take 1 tablet p.o. nightly as needed muscle spasms   topiramate  (TOPAMAX ) 50 MG tablet Take 50 mg by mouth 2 (two) times daily.   traZODone  (DESYREL ) 50 MG tablet Take 50 mg by mouth at bedtime.   valACYclovir  (VALTREX ) 1000 MG tablet Take 1,000 mg by mouth daily.   valsartan (DIOVAN) 160 MG tablet Take 1 tablet by mouth daily.   venlafaxine  XR (EFFEXOR -XR) 150 MG 24 hr capsule Take 1 capsule by mouth daily.   VENTOLIN  HFA 108 (90 Base) MCG/ACT inhaler    WEGOVY 2.4 MG/0.75ML SOAJ Inject 2.4 mg into the skin once a week.   No facility-administered encounter medications on file as of 03/29/2024.    Social History: Social History   Tobacco Use   Smoking status: Former    Current packs/day: 0.00    Types: Cigarettes    Quit date: 09/2018    Years since quitting: 5.5   Smokeless tobacco: Never  Substance Use Topics   Alcohol use: No   Drug use: No    Family Medical History: No family history on file.  Physical Examination: There were no vitals filed for this visit.    Awake, alert, oriented to person, place, and time.  Speech is clear and fluent. Fund of knowledge is appropriate.   Cranial Nerves: Pupils equal round and reactive to light.  Facial tone is symmetric.    No abnormal lesions on exposed skin.   Strength:  Side Iliopsoas Quads Hamstring PF DF EHL  R 5 5 5 5 5 5   L 5 5 5 5 5 5    Reflexes are 2+ and symmetric at the patella and achilles.    Clonus is not present.   Bilateral lower extremity sensation is intact to light touch.     She ambulates with a walking stick.    Medical Decision Making  Imaging: none   Assessment and Plan: [name] continues with  constant LBP along with more constant bilateral leg pain posterior to her feet. This is worse with prolonged standing and walking.   She has history of L1 compression fracture s/p fall on 08/12/22. She had L1 kyphoplasty by Dr. Gar on 08/20/22.   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   Treatment options discussed with patient and following plan made:   - She has f/u with PCP on 11/16/23. She will message me with weight. If she has lost at least 10 more pounds then will order imaging(weight goat is 250-260 lbs). - She will need updated lumbar MRI and lumbar  xrays with flex/ext.  - She states she is able to have lumbar MRI. No known contraindications. Would need to remove piercings. Would need to do WIDE BORE MRI in [place].  - No improvement with HHPT last year. Notes under  media.  - Will have her f/u with Dr. Clois after her updated imaging.   I spent a total of 20 minutes in face-to-face and non-face-to-face activities related to this patient's care today including review of outside records, review of imaging, review of symptoms, physical exam, discussion of differential diagnosis, discussion of treatment options, and documentation.   Glade Boys PA-C Dept. of Neurosurgery

## 2024-03-29 ENCOUNTER — Ambulatory Visit: Admitting: Orthopedic Surgery

## 2024-03-29 NOTE — Progress Notes (Signed)
 Referring Physician:  Towana Pride, DO [address] [place],  [place] [address]  Primary Physician:  Towana Pride, DO  History of Present Illness: [name] has a history of GERD, HTN, obesity, bipolar, migraines, mixed restrictive/obstructive lung disease.   Last seen by me on 09/14/23- she has known L1 compression fracture s/p fall on 08/12/22. She had L1 kyphoplasty by Dr. Gar on 08/20/22. She also has slip L4-L5 with facet hypertrophy and severe central stenosis with moderate/severe bilateral foraminal stenosis.   I ordered lumbar MRI at her last visit and insurance denied as she has not done PT recently. She had been working on weight loss with a goal of 250-260lbs.   PT was ordered and she is here for follow up.   She continues with constant LBP along with constant bilateral leg pain posterior to her feet. This is worse with prolonged standing and walking. The numbness and tingling in her legs has continue to improve with weight loss, but is still present. She has weakness in her legs.   She saw PCP on 03/28/24 and had injection in left GTB. This helped, but she still can't sleep on this side.   She is in PT at [facility] in [place] and has done 1 visit.   She has been working on weight loss. She is down to 234 lbs.   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   She quit smoking in 2020.   Bowel/Bladder Dysfunction: some urinary urgency, no issues with bowels. No perineal numbness.   Conservative measures:  Physical therapy: did HHPT with [facility] from 08/31/22 until 12/08/22. She is in PT at [facility] in [place]- has done 1 visit.  Multimodal medical therapy including regular antiinflammatories: tylenol , norco, zanaflex , neurontin  Injections:  bilateral L5-S1 TF ESI on 07/09/23 bilateral L5-S1 TF ESI on 01/22/23 with no improvement  Past Surgery:  L1 kyphoplasty by Dr. Gar on 08/20/22  The symptoms are causing a significant impact on the patient's  life.   Review of Systems:  A 10 point review of systems is negative, except for the pertinent positives and negatives detailed in the HPI.  Past Medical History: Past Medical History:  Diagnosis Date   Acid reflux    Anxiety    Depression     Past Surgical History: Past Surgical History:  Procedure Laterality Date   BREAST BIOPSY Left 2014   benign with s shaped clip   ENDOMETRIAL ABLATION     IR KYPHO LUMBAR INC FX REDUCE BONE BX UNI/BIL CANNULATION INC/IMAGING  08/20/2022   KNEE SURGERY Left    TONSILLECTOMY     TUBAL LIGATION      Allergies: Allergies as of 04/04/2024 - Review Complete 04/04/2024  Allergen Reaction Noted   Bactrim [sulfamethoxazole-trimethoprim] Hives 03/26/2015    Medications: Outpatient Encounter Medications as of 04/04/2024  Medication Sig   amLODipine  (NORVASC ) 5 MG tablet SMARTSIG:1 Tablet(s) By Mouth Every Evening   ARIPiprazole (ABILIFY) 5 MG tablet Take 5 mg by mouth daily.   atorvastatin (LIPITOR) 20 MG tablet Take 20 mg by mouth daily.   budesonide-formoterol  (SYMBICORT) 160-4.5 MCG/ACT inhaler Inhale 2 puffs into the lungs 2 (two) times daily.   DULoxetine (CYMBALTA) 30 MG capsule Take 1 capsule (30 mg total) by mouth daily for 14 days, THEN 2 capsules (60 mg total) daily.   furosemide  (LASIX ) 40 MG tablet Take 1 tablet (40 mg total) by mouth daily.   gabapentin  (NEURONTIN ) 300 MG  capsule Take 300 mg by mouth 3 (three) times daily.   HYDROcodone -acetaminophen  (NORCO) 10-325 MG tablet Take 1-2 tablets by mouth as needed.   hydrOXYzine (VISTARIL) 25 MG capsule TAKE 1 CAPSULE BY MOUTH AT BEDTIME AS NEEDED FOR ANXIETY AND SLEEP   ketoconazole (NIZORAL) 2 % cream SMARTSIG:1 Application Topical 1 to 2 Times Daily   lamoTRIgine  (LAMICTAL ) 200 MG tablet Take 200 mg by mouth at bedtime.   montelukast (SINGULAIR) 10 MG tablet SINGULAIR 10 MG TABS   nystatin  (MYCOSTATIN /NYSTOP ) powder Apply to affected area 3 times daily   omeprazole (PRILOSEC) 20 MG  capsule Take 20 mg by mouth in the morning and at bedtime.   potassium chloride  SA (KLOR-CON  M) 20 MEQ tablet Take 1 tablet (20 mEq total) by mouth daily.   traZODone  (DESYREL ) 50 MG tablet Take 50 mg by mouth at bedtime.   valACYclovir  (VALTREX ) 1000 MG tablet Take 1,000 mg by mouth daily.   valsartan (DIOVAN) 160 MG tablet Take 1 tablet by mouth daily.   venlafaxine  XR (EFFEXOR -XR) 150 MG 24 hr capsule Take 1 capsule by mouth daily.   VENTOLIN  HFA 108 (90 Base) MCG/ACT inhaler    WEGOVY 2.4 MG/0.75ML SOAJ Inject 2.4 mg into the skin once a week.   [DISCONTINUED] ondansetron  (ZOFRAN -ODT) 4 MG disintegrating tablet Dissolve 1 tablet (4 mg total) in the mouth every eight (8) hours as needed for nausea.   [DISCONTINUED] polyethylene glycol (GOLYTELY ) 236 g solution Take by mouth as directed per [facility] GI prep instructions, for split bowel prep.   [DISCONTINUED] tiZANidine  (ZANAFLEX ) 2 MG tablet Take 1 tablet p.o. nightly as needed muscle spasms   [DISCONTINUED] topiramate  (TOPAMAX ) 50 MG tablet Take 50 mg by mouth 2 (two) times daily.   No facility-administered encounter medications on file as of 04/04/2024.    Social History: Social History   Tobacco Use   Smoking status: Former    Current packs/day: 0.00    Types: Cigarettes    Quit date: 09/2018    Years since quitting: 5.5   Smokeless tobacco: Never  Substance Use Topics   Alcohol use: No   Drug use: No    Family Medical History: No family history on file.  Physical Examination: Vitals:   04/04/24 1324  BP: 124/84    Awake, alert, oriented to person, place, and time.  Speech is clear and fluent. Fund of knowledge is appropriate.   Cranial Nerves: Pupils equal round and reactive to light.  Facial tone is symmetric.    No abnormal lesions on exposed skin.   Strength: Side Biceps Triceps Deltoid Interossei Grip Wrist Ext. Wrist Flex.  R 5 5 5 5 5 5 5   L 5 5 5 5 5 5 5     Side Iliopsoas Quads Hamstring PF DF EHL  R 5 5  5 5 5 5   L 5 5 5 5 5 5    Reflexes are 2+ and symmetric at the biceps, brachioradialis, patella, and achilles.    Clonus is not present.   Hoffmans is absent in bilateral upper extremities.   Bilateral upper and lower extremity sensation is intact to light touch.     No pain with IR/ER of both hips. She has tenderness over left greater trochanteric bursa, no tenderness on right.   Normal gait.    Medical Decision Making  Imaging: none   Assessment and Plan: Ms. Stoneberg continues with constant LBP along with constant bilateral leg pain posterior to her feet. This is worse with prolonged  standing and walking.   She has history of L1 compression fracture s/p fall on 08/12/22. She had L1 kyphoplasty by Dr. Gar on 08/20/22.   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   Treatment options discussed with patient and following plan made:   - She is in PT now at [facility] in [place]. She will continue this.  - She has done great with weight loss.  - Will reorder her lumbar MRI once she completes PT. Would need to remove piercings. Would need to do WIDE BORE MRI in [place].  - Will get updated lumbar xrays with flex/ext when she gets MRI.  - Will do MyChart visit to review her imaging and then likely get her in to see Dr. Clois.   I spent a total of 20 minutes in face-to-face and non-face-to-face activities related to this patient's care today including review of outside records, review of imaging, review of symptoms, physical exam, discussion of differential diagnosis, discussion of treatment options, and documentation.   Glade Boys PA-C Dept. of Neurosurgery

## 2024-04-04 ENCOUNTER — Ambulatory Visit: Admitting: Orthopedic Surgery

## 2024-04-04 ENCOUNTER — Encounter: Payer: Self-pay | Admitting: Orthopedic Surgery

## 2024-04-04 VITALS — BP 124/84 | Ht 66.0 in | Wt 234.0 lb

## 2024-04-04 DIAGNOSIS — S32010D Wedge compression fracture of first lumbar vertebra, subsequent encounter for fracture with routine healing: Secondary | ICD-10-CM

## 2024-04-04 DIAGNOSIS — M4316 Spondylolisthesis, lumbar region: Secondary | ICD-10-CM

## 2024-04-04 DIAGNOSIS — M5416 Radiculopathy, lumbar region: Secondary | ICD-10-CM

## 2024-04-04 DIAGNOSIS — M4726 Other spondylosis with radiculopathy, lumbar region: Secondary | ICD-10-CM | POA: Diagnosis not present

## 2024-04-04 DIAGNOSIS — M47816 Spondylosis without myelopathy or radiculopathy, lumbar region: Secondary | ICD-10-CM

## 2024-04-04 DIAGNOSIS — S32010S Wedge compression fracture of first lumbar vertebra, sequela: Secondary | ICD-10-CM

## 2024-04-04 DIAGNOSIS — M48061 Spinal stenosis, lumbar region without neurogenic claudication: Secondary | ICD-10-CM | POA: Diagnosis not present

## 2024-04-04 DIAGNOSIS — W19XXXD Unspecified fall, subsequent encounter: Secondary | ICD-10-CM | POA: Diagnosis not present

## 2024-04-21 DIAGNOSIS — M48061 Spinal stenosis, lumbar region without neurogenic claudication: Secondary | ICD-10-CM

## 2024-04-21 DIAGNOSIS — M5416 Radiculopathy, lumbar region: Secondary | ICD-10-CM

## 2024-04-21 DIAGNOSIS — M4316 Spondylolisthesis, lumbar region: Secondary | ICD-10-CM

## 2024-04-21 DIAGNOSIS — M47816 Spondylosis without myelopathy or radiculopathy, lumbar region: Secondary | ICD-10-CM

## 2024-04-27 DIAGNOSIS — M47816 Spondylosis without myelopathy or radiculopathy, lumbar region: Secondary | ICD-10-CM

## 2024-04-27 DIAGNOSIS — M4316 Spondylolisthesis, lumbar region: Secondary | ICD-10-CM

## 2024-04-27 DIAGNOSIS — M48061 Spinal stenosis, lumbar region without neurogenic claudication: Secondary | ICD-10-CM

## 2024-04-27 DIAGNOSIS — M5416 Radiculopathy, lumbar region: Secondary | ICD-10-CM

## 2024-04-28 NOTE — Progress Notes (Signed)
 COMPLEX CASE MANAGEMENT  FOLLOW UP NOTE Summary: Case Engineer, Manufacturing spoke with patient and verified correct patient using two identifiers today for Complex Case Management follow up. Patient currently resides at Home. Primary concern is home repairs.    Subjective:  Patient/caregiver reported she doing well  Objective:   Screenings Completed during visit: None completed at this visit  Barriers to care: none identified  Interventions provided: Supportive Listening   Progress towards  goal : On Track 04/28/24  Plan:   1. Case Management to: Follow up in 1 weeks  2. Patient/caregiver to: call with any concerns  Discuss at next outreach: Graduation from Complex Case Management  Care Coordination Note updated in [facility]: Yes  This patient is currently receiving Complex Case Management services.    Primary Case Manager: Alfonso Pinal, RN [phone] Please contact CM for care plan changes, updates or recent discharges.  High Risk Drivers: Multiple Complex Diagnoses Primary Disease Process: Chronic Pain, Depression, and HTN Current Residence: Home alone Primary Medical Home: [facility], DO's office   Referred to Strategic Scheduling for assistance establishing a PCP: not applicable  Current services: Orthopedic and Psychiatrist Patient's Primary Concern is/goals jmz:Mzilrz food insecurity and financial strain Barriers: Financial Stress Strengths: Self-advocacy Supports: Adult Children and friends Interventions provided: Contact Information Provided, Introduction to ICM, and Medication Reconciliation,sent resources for assist with utilities,sent resource for assist with incontinence wear,sent resources for support groups, sent resources for home repairs  Follow up with ICM Team Member: 1 week   Future Appointments  Date Time Provider Department Center  05/04/2024  4:20 PM Pinal Alfonso [facility] PHA [facility]  05/05/2024  3:00 PM Shelda Alfonso CROME, MD [facility]  05/09/2024  2:00 PM Verlan Sheryle Moan, [facility]  05/15/2024  1:00 PM Mahlon Quale Pagnotta, LCSW [facility]  05/17/2024  3:20 PM Towana Dayton Sharps, DO [facility]  05/30/2024  2:20 PM Ro, Lauraine Gunner, MD [id number]  06/05/2024 11:00 AM Towana Dayton Sharps, DO [facility]    Problem List           Diagnosed    . High blood pressure     . Acid reflux     . Hemorrhoids     . Migraines     . Mixed restrictive and obstructive lung disease     . Degeneration of lumbar or lumbosacral intervertebral disc     . Class 3 obesity     . Osteoarthritis of both knees     . Left foot pain     . Chronic pain syndrome     . Closed compression fracture of first lumbar vertebra     . High cholesterol or triglycerides     . Elevated liver function tests     . Suspected sleep apnea     . Metabolic dysfunction-associated steatotic liver disease (MASLD)     Allergies:  Allergies[1]  Medications: Prior to Admission medications  Medication Dose, Route, Frequency  albuterol  2.5 mg /3 mL (0.083 %) nebulizer solution Inhale 1 vial (2.5 mg total) by nebulization every four (4) hours as needed for wheezing.  atorvastatin (LIPITOR) 20 MG tablet 20 mg, Oral, Daily (standard)  DULoxetine (CYMBALTA) 30 MG capsule Take 1 capsule (30 mg total) by mouth daily for 14 days, THEN 2 capsules (60 mg total) daily.  HYDROcodone -acetaminophen  (NORCO 10-325) 10-325 mg per tablet 1 tablet, Oral, Every 8 hours PRN  hydrOXYzine (ATARAX) 25 MG tablet 25  mg, Oral, Nightly PRN  ketoconazole (NIZORAL) 2 % cream 1 Application, Topical, Daily (standard)  lamoTRIgine  (LAMICTAL ) 200 MG tablet 100 mg, Oral, Nightly  miscellaneous medical supply (BLOOD PRESSURE CUFF) Misc 1 Device, Miscellaneous, Daily  nitrofurantoin , macrocrystal-monohydrate, (MACROBID ) 100 MG capsule 100 mg, Oral, 2 times a day (standard)  nystatin  (MYCOSTATIN ) 100,000  unit/gram powder Apply to affected area 3 times daily  omeprazole (PRILOSEC) 20 MG capsule 20 mg, Oral, 2 times a day  potassium chloride  20 MEQ ER tablet 20 mEq, Oral, Daily (standard)  promethazine (PHENERGAN) 25 MG tablet 25 mg, Oral, Every 6 hours PRN  traZODone  (DESYREL ) 50 MG tablet 150 mg, Oral, Nightly  valACYclovir  (VALTREX ) 1000 MG tablet 1,000 mg, Oral, Daily (standard)      Kaye JONELLE Glance 04/28/2024       [1] Allergies Allergen Reactions  . Sulfamethoxazole-Trimethoprim Hives and Other (See Comments)

## 2024-05-21 NOTE — Progress Notes (Signed)
 [facility] [address] [place], [place] [address]  Pulmonary Sleep Medicine   Office Visit Note  Patient Name: Brittany Cobb DOB: 07-26-1970 MRN [id number]    Chief Complaint: Obstructive Sleep Apnea visit  Brief History:  Brittany Cobb is seen today for a follow up visit for APAP@ 6-14 cmH2O. The patient has a 1 year history of sleep apnea. Patient is not using PAP nightly.  The patient feels rested after sleeping with PAP.  The patient reports benefiting from PAP use. Reported sleepiness is  improved. However, patient has lost 83 lbs since initial testing and has been able to sleep without PAP and the Epworth Sleepiness Score is 5 out of 24. The patient does not take naps. The patient complains of the following: pt is told that she still snores without PAP. The compliance download shows 15% compliance with an average use time of 6 hours 44 minutes. The AHI is 1.2.  The patient does not complain of limb movements disrupting sleep. The patient continues to require PAP therapy in order to eliminate sleep apnea.   ROS  General: (-) fever, (-) chills, (-) night sweat Nose and Sinuses: (-) nasal stuffiness or itchiness, (-) postnasal drip, (-) nosebleeds, (-) sinus trouble. Mouth and Throat: (-) sore throat, (-) hoarseness. Neck: (-) swollen glands, (-) enlarged thyroid, (-) neck pain. Respiratory: - cough, - shortness of breath, - wheezing. Neurologic: - numbness, - tingling. Psychiatric: + anxiety, + depression   Current Medication: Outpatient Encounter Medications as of 05/22/2024  Medication Sig Note   hydrOXYzine (ATARAX) 25 MG tablet Take 25 mg by mouth.    ZEPBOUND 2.5 MG/0.5ML Pen Inject 2.5 mg into the skin.    amLODipine  (NORVASC ) 5 MG tablet SMARTSIG:1 Tablet(s) By Mouth Every Evening    ARIPiprazole (ABILIFY) 5 MG tablet Take 5 mg by mouth daily.    atorvastatin (LIPITOR) 20 MG tablet Take 20 mg by mouth daily.    budesonide-formoterol  (SYMBICORT) 160-4.5 MCG/ACT  inhaler Inhale 2 puffs into the lungs 2 (two) times daily.    DULoxetine (CYMBALTA) 30 MG capsule Take 1 capsule (30 mg total) by mouth daily for 14 days, THEN 2 capsules (60 mg total) daily.    furosemide  (LASIX ) 40 MG tablet Take 1 tablet (40 mg total) by mouth daily.    gabapentin  (NEURONTIN ) 300 MG capsule Take 300 mg by mouth 3 (three) times daily.    HYDROcodone -acetaminophen  (NORCO) 10-325 MG tablet Take 1-2 tablets by mouth as needed.    hydrOXYzine (VISTARIL) 25 MG capsule TAKE 1 CAPSULE BY MOUTH AT BEDTIME AS NEEDED FOR ANXIETY AND SLEEP    ketoconazole (NIZORAL) 2 % cream SMARTSIG:1 Application Topical 1 to 2 Times Daily    lamoTRIgine  (LAMICTAL ) 200 MG tablet Take 200 mg by mouth at bedtime.    montelukast (SINGULAIR) 10 MG tablet SINGULAIR 10 MG TABS    nystatin  (MYCOSTATIN /NYSTOP ) powder Apply to affected area 3 times daily    omeprazole (PRILOSEC) 20 MG capsule Take 20 mg by mouth in the morning and at bedtime.    potassium chloride  SA (KLOR-CON  M) 20 MEQ tablet Take 1 tablet (20 mEq total) by mouth daily.    traZODone  (DESYREL ) 50 MG tablet Take 50 mg by mouth at bedtime.    valACYclovir  (VALTREX ) 1000 MG tablet Take 1,000 mg by mouth daily.    valsartan (DIOVAN) 160 MG tablet Take 1 tablet by mouth daily.    venlafaxine  XR (EFFEXOR -XR) 150 MG 24 hr capsule Take 1 capsule by  mouth daily. 04/04/2024: Take 75mg  for 2 weeks and then stop   VENTOLIN  HFA 108 (90 Base) MCG/ACT inhaler     WEGOVY 2.4 MG/0.75ML SOAJ Inject 2.4 mg into the skin once a week.    No facility-administered encounter medications on file as of 05/22/2024.    Surgical History: Past Surgical History:  Procedure Laterality Date   BREAST BIOPSY Left 2014   benign with s shaped clip   ENDOMETRIAL ABLATION     IR KYPHO LUMBAR INC FX REDUCE BONE BX UNI/BIL CANNULATION INC/IMAGING  08/20/2022   KNEE SURGERY Left    TONSILLECTOMY     TUBAL LIGATION      Medical History: Past Medical History:  Diagnosis Date    Acid reflux    Anxiety    Depression     Family History: Non contributory to the present illness  Social History: Social History   Socioeconomic History   Marital status: Divorced    Spouse name: Not on file   Number of children: Not on file   Years of education: Not on file   Highest education level: Not on file  Occupational History   Not on file  Tobacco Use   Smoking status: Former    Current packs/day: 0.00    Types: Cigarettes    Quit date: 09/2018    Years since quitting: 5.6   Smokeless tobacco: Never  Substance and Sexual Activity   Alcohol use: No   Drug use: No   Sexual activity: Not Currently  Other Topics Concern   Not on file  Social History Narrative   Not on file   Social Drivers of Health   Financial Resource Strain: High Risk (03/28/2024)   Received from [facility]   Overall Financial Resource Strain (CARDIA)    How hard is it for you to pay for the very basics like food, housing, medical care, and heating?: Hard  Food Insecurity: Food Insecurity Present (03/28/2024)   Received from [facility]   Hunger Vital Sign    Within the past 12 months, you worried that your food would run out before you got the money to buy more.: Often true    Within the past 12 months, the food you bought just didn't last and you didn't have money to get more.: Patient declined  Transportation Needs: No Transportation Needs (03/28/2024)   Received from [facility] - Transportation    Lack of Transportation (Medical): No    Lack of Transportation (Non-Medical): No  Physical Activity: Inactive (01/06/2024)   Received from [facility]   Exercise Vital Sign    On average, how many days per week do you engage in moderate to strenuous exercise (like a brisk walk)?: 0 days    On average, how many minutes do you engage in exercise at this level?: 0 min  Stress: Stress Concern Present (01/06/2024)   Received from [facility] - Occupational Stress Questionnaire    Do you feel stress - tense, restless, nervous, or anxious, or unable to sleep at night because your mind is troubled all the time - these days?: Rather much  Social Connections: Socially Isolated (01/06/2024)   Received from [facility]   Social Connection and Isolation Panel    In a typical week, how many times do you talk on the phone with family, friends, or neighbors?: Twice a week    How often do  you get together with friends or relatives?: Never    How often do you attend church or religious services?: Never    Do you belong to any clubs or organizations such as church groups, unions, fraternal or athletic groups, or school groups?: No    How often do you attend meetings of the clubs or organizations you belong to?: Never    Are you married, widowed, divorced, separated, never married, or living with a partner?: Divorced  Intimate Partner Violence: Not At Risk (01/06/2024)   Received from [facility]   Humiliation, Afraid, Rape, and Kick questionnaire    Within the last year, have you been afraid of your partner or ex-partner?: No    Within the last year, have you been humiliated or emotionally abused in other ways by your partner or ex-partner?: No    Within the last year, have you been kicked, hit, slapped, or otherwise physically hurt by your partner or ex-partner?: No    Within the last year, have you been raped or forced to have any kind of sexual activity by your partner or ex-partner?: No    Vital Signs: Blood pressure (!) 167/103, pulse 75, resp. rate 16, height 5' 7 (1.702 m), weight 250 lb (113.4 kg), last menstrual period 03/18/2015, SpO2 98%. Body mass index is 39.16 kg/m.    Examination: General Appearance: The patient is well-developed, well-nourished, and in no distress. Neck Circumference: 45 cm Skin: Gross inspection of skin unremarkable. Head: normocephalic, no gross deformities. Eyes: no gross  deformities noted. ENT: ears appear grossly normal Neurologic: Alert and oriented. No involuntary movements.  STOP BANG RISK ASSESSMENT S (snore) Have you been told that you snore?     YES   T (tired) Are you often tired, fatigued, or sleepy during the day?   YES  O (obstruction) Do you stop breathing, choke, or gasp during sleep? NO   P (pressure) Do you have or are you being treated for high blood pressure? YES   B (BMI) Is your body index greater than 35 kg/m? YES   A (age) Are you 60 years old or older? YES   N (neck) Do you have a neck circumference greater than 16 inches?   YES   G (gender) Are you a female? NO   TOTAL STOP/BANG "YES" ANSWERS 6       A STOP-Bang score of 2 or less is considered low risk, and a score of 5 or more is high risk for having either moderate or severe OSA. For people who score 3 or 4, doctors may need to perform further assessment to determine how likely they are to have OSA.         EPWORTH SLEEPINESS SCALE:  Scale:  (0)= no chance of dozing; (1)= slight chance of dozing; (2)= moderate chance of dozing; (3)= high chance of dozing  Chance  Situtation    Sitting and reading: 1    Watching TV: 2    Sitting Inactive in public: 0    As a passenger in car: 0      Lying down to rest: 2    Sitting and talking: 0    Sitting quielty after lunch: 0    In a car, stopped in traffic: 0   TOTAL SCORE:   5 out of 24    SLEEP STUDIES:  HST (05/2023) AHI 31/hr, min SpO2 77% Titration (07/2023) APAP@ 6-14 cmH2O   CPAP COMPLIANCE DATA:  Date Range: 11/19/2023-05/16/2024  Average Daily  Use: 6 hours 44 minutes  Median Use: 7 hours 18 minutes  Compliance for > 4 Hours: 15%  AHI: 1.2 respiratory events per hour  Days Used: 35/180 days  Mask Leak: 8.3  95th Percentile Pressure: 12.4         LABS: No results found for this or any previous visit (from the past 2160 hours).  Radiology: CT Thoracic Spine Wo Contrast Result  Date: 08/11/2022 CLINICAL DATA:  Back pain after fall with known L1 vertebral body compression fracture EXAM: CT THORACIC SPINE WITHOUT CONTRAST TECHNIQUE: Multidetector CT images of the thoracic were obtained using the standard protocol without intravenous contrast. RADIATION DOSE REDUCTION: This exam was performed according to the departmental dose-optimization program which includes automated exposure control, adjustment of the mA and/or kV according to patient size and/or use of iterative reconstruction technique. COMPARISON:  Same day lumbar spine CT FINDINGS: Alignment: Preserved thoracic kyphosis without static listhesis. Vertebrae: Acute superior endplate compression fracture of the L1 vertebral body, as described on dedicated CT of the lumbar spine. Thoracic vertebral body heights are maintained without evidence of fracture. No pathologic bone process. Paraspinal and other soft tissues: Negative. Disc levels: Thoracic intervertebral disc heights are preserved. Minimal facet joint hypertrophy within the lower thoracic spine. IMPRESSION: 1. No acute osseous abnormality of the thoracic spine. 2. Acute superior endplate compression fracture of the L1 vertebral body, as described on dedicated CT of the lumbar spine. Electronically Signed   By: Mabel Converse D.O.   On: 08/11/2022 16:08   CT Knee Right Wo Contrast Result Date: 08/11/2022 CLINICAL DATA:  Knee trauma, no prior imaging (Age >= 5y) Unable to ambulate EXAM: CT OF THE RIGHT KNEE WITHOUT CONTRAST TECHNIQUE: Multidetector CT imaging of the right knee was performed according to the standard protocol. Multiplanar CT image reconstructions were also generated. RADIATION DOSE REDUCTION: This exam was performed according to the departmental dose-optimization program which includes automated exposure control, adjustment of the mA and/or kV according to patient size and/or use of iterative reconstruction technique. COMPARISON:  None Available. FINDINGS:  Bones/Joint/Cartilage There is no evidence of acute fracture. There is tricompartment osteophyte formation with moderate-severe medial compartment joint space narrowing. There is a trace joint effusion. There is a small ossified joint body in the superolateral knee measuring 6 mm. Ligaments Suboptimally assessed by CT. Muscles and Tendons No acute myotendinous abnormality by CT. Soft tissues No focal fluid collection. IMPRESSION: No evidence of acute fracture. 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 Trace joint effusion. Electronically Signed   By: Jacob  Kahn M.D.   On: 08/11/2022 14:09   CT Lumbar Spine Wo Contrast Result Date: 08/11/2022 CLINICAL DATA:  Back pain. EXAM: CT LUMBAR SPINE WITHOUT CONTRAST TECHNIQUE: Multidetector CT imaging of the lumbar spine was performed without intravenous contrast administration. Multiplanar CT image reconstructions were also generated. RADIATION DOSE REDUCTION: This exam was performed according to the departmental dose-optimization program which includes automated exposure control, adjustment of the mA and/or kV according to patient size and/or use of iterative reconstruction technique. COMPARISON:  None Available. FINDINGS: Segmentation: 5 lumbar type vertebrae. Alignment: Trace anterolisthesis of L4 on L5. Vertebrae: There is an acute appearing superior endplate compression deformity at L1. There is no evidence of retropulsion. There is a proximally 40% height loss. No evidence of an epidural hematoma. Paraspinal and other soft tissues: Small left interpolar renal stone. No hydronephrosis. Disc levels: Moderate spinal canal stenosis at L4-L5 secondary to combination of a small disc bulge and ligamentum flavum hypertrophy.  There is also moderate right-sided neural foraminal stenosis at L4-L5. IMPRESSION: 1. Acute appearing superior endplate compression deformity at L1 with approximately 40% height loss. No evidence of retropulsion. No  evidence of an epidural hematoma. 2. Moderate spinal canal stenosis at L4-L5 secondary to combination of a small disc bulge and ligamentum flavum hypertrophy. Moderate right-sided neural foraminal stenosis at L4-L5. Electronically Signed   By: Lyndall Gore M.D.   On: 08/11/2022 14:00    No results found.  No results found.    Assessment and Plan: Patient Active Problem List   Diagnosis Date Noted   OSA (obstructive sleep apnea) 11/22/2023   CPAP use counseling 11/22/2023   Morbid obesity (HCC) 11/22/2023   Compression fracture of L1 lumbar vertebra, closed, initial encounter (HCC) 08/12/2022   Inadequate pain control 08/11/2022   Obesity, Class III, BMI 40-49.9 (morbid obesity) (HCC) 08/11/2022   Essential hypertension 08/11/2022   GERD (gastroesophageal reflux disease) 08/11/2022   Anxiety 08/11/2022   Grieving 08/11/2022   Bipolar disorder with severe depression (HCC) 02/28/2016   Mixed anxiety depressive disorder 02/20/2009   1. OSA (obstructive sleep apnea) (Primary) The patient does tolerate PAP and reports  benefit from PAP use. She has been in the process of losing weight and has stopped using her cpap recently. She is encouraged to use it as she feels better when she does. She is hoping to lose enough weight that she no longer needs it. The patient was reminded how to clean equipment and advised to replace supplies routinely. The patient was also counselled on weight loss. The compliance is poor. The AHI is 1.2.   OSA- resume use of cpap. F/u in 38m to discuss retesting due to weight loss  2. CPAP use counseling CPAP Counseling: had a lengthy discussion with the patient regarding the importance of PAP therapy in management of the sleep apnea. Patient appears to understand the risk factor reduction and also understands the risks associated with untreated sleep apnea. Patient will try to make a good faith effort to remain compliant with therapy. Also instructed the patient on  proper cleaning of the device including the water must be changed daily if possible and use of distilled water is preferred. Patient understands that the machine should be regularly cleaned with appropriate recommended cleaning solutions that do not damage the PAP machine for example given white vinegar and water rinses. Other methods such as ozone treatment may not be as good as these simple methods to achieve cleaning.   3. Morbid obesity (HCC) Obesity Counseling: Had a lengthy discussion regarding patients BMI and weight issues. Patient was instructed on portion control as well as increased activity. Also discussed caloric restrictions with trying to maintain intake less than 2000 Kcal. Discussions were made in accordance with the 5As of weight management. Simple actions such as not eating late and if able to, taking a walk is suggested.     General Counseling: I have discussed the findings of the evaluation and examination with Brittany Cobb.  I have also discussed any further diagnostic evaluation thatmay be needed or ordered today. Brittany Cobb verbalizes understanding of the findings of todays visit. We also reviewed her medications today and discussed drug interactions and side effects including but not limited excessive drowsiness and altered mental states. We also discussed that there is always a risk not just to her but also people around her. she has been encouraged to call the office with any questions or concerns that should arise related to todays visit.  No orders of the defined types were placed in this encounter.       I have personally obtained a history, examined the patient, evaluated laboratory and imaging results, formulated the assessment and plan and placed orders. This patient was seen today by Brittany Lay, PA-C in collaboration with Dr. Elfreda Bathe.   Elfreda DELENA Bathe, MD [facility] Diplomate ABMS Pulmonary Critical Care Medicine and Sleep Medicine

## 2024-05-22 ENCOUNTER — Ambulatory Visit: Admitting: Internal Medicine

## 2024-05-22 VITALS — BP 167/103 | HR 75 | Resp 16 | Ht 67.0 in | Wt 250.0 lb

## 2024-05-22 DIAGNOSIS — G4733 Obstructive sleep apnea (adult) (pediatric): Secondary | ICD-10-CM

## 2024-05-22 DIAGNOSIS — Z7189 Other specified counseling: Secondary | ICD-10-CM

## 2024-05-22 NOTE — Patient Instructions (Signed)

## 2024-05-24 NOTE — Telephone Encounter (Signed)
 Can you please call her and update her medication list? I think she has stopped some of her medications. I would like to review this and then should be able to call in something for her MRI.   Thanks!

## 2024-05-25 ENCOUNTER — Other Ambulatory Visit: Payer: Self-pay

## 2024-05-25 MED ORDER — DIAZEPAM 5 MG PO TABS: ORAL_TABLET | ORAL | 0 refills | Status: DC

## 2024-05-25 NOTE — Addendum Note (Signed)
 Addended by: HILMA HASTINGS on: 05/25/2024 10:14 AM   Modules accepted: Orders

## 2024-05-25 NOTE — Telephone Encounter (Signed)
 Valium sent to her pharmacy for her MRI. Remind her that she will need a driver.   Also please remind her she will need to remove all her piercings for the MRI.   PMP reviewed and is appropriate.

## 2024-05-26 ENCOUNTER — Ambulatory Visit
Admission: RE | Admit: 2024-05-26 | Discharge: 2024-05-26 | Attending: Orthopedic Surgery | Admitting: Orthopedic Surgery

## 2024-05-26 DIAGNOSIS — M4316 Spondylolisthesis, lumbar region: Secondary | ICD-10-CM

## 2024-05-26 DIAGNOSIS — M5416 Radiculopathy, lumbar region: Secondary | ICD-10-CM

## 2024-05-26 DIAGNOSIS — M47816 Spondylosis without myelopathy or radiculopathy, lumbar region: Secondary | ICD-10-CM

## 2024-05-26 DIAGNOSIS — M48061 Spinal stenosis, lumbar region without neurogenic claudication: Secondary | ICD-10-CM

## 2024-05-29 NOTE — Telephone Encounter (Signed)
-------------------------------------------------------------------------------   Summary: Medication refill -------------------------------------------------------------------------------  Patient is requesting the following refill Requested Prescriptions   Pending Prescriptions Disp Refills  . HYDROcodone -acetaminophen  (NORCO 10-325) 10-325 mg per tablet 60 tablet 0    Sig: Take 1 tablet by mouth every eight (8) hours as needed (for chronic pain).  . hydrOXYzine (ATARAX) 25 MG tablet 30 tablet 0    Sig: Take 1 tablet (25 mg total) by mouth nightly as needed for anxiety (and panic).    Recent Visits Date Type Provider Dept  05/17/24 Office Visit Towana Dayton Sharps, DO [facility] [address] [facility]  05/15/24 Office Visit Mahlon, Arlyne Schatz, [place] [facility] [address] At [facility]  04/24/24 Office Visit Towana, Dayton Sharps, DO [facility] [address] At [facility]  04/21/24 Telemedicine Mahlon, Arlyne Schatz, [place] [facility] [address] At [facility]  03/28/24 Office Visit Towana, Dayton Sharps, DO [facility] [address] At [facility]  01/03/24 Office Visit Towana, Dayton Sharps, DO [facility] [address] At [facility]  11/19/23 Office Visit Towana, Dayton Sharps, DO [facility] [address] [facility]  10/18/23 Office Visit Towana, Dayton Sharps, DO [facility] [address] At [facility]  09/01/23 Office Visit Towana, Dayton Sharps, DO [facility] [address] At [facility]  08/02/23 Office Visit Towana, Dayton Sharps, DO [facility] [address] [facility] [employer]  Showing recent visits within past 365 days and meeting all other requirements Future Appointments Date Type Provider Dept  06/05/24 Appointment Towana, Dayton Sharps, DO [facility] [address] At [facility]  06/05/24 Appointment Mahlon, Jacqueline Pagnotta, LCSW [facility] [address] [facility]  Showing future appointments within  next 365 days and meeting all other requirements

## 2024-05-30 ENCOUNTER — Other Ambulatory Visit: Payer: Self-pay | Admitting: Orthopedic Surgery

## 2024-05-30 DIAGNOSIS — M48061 Spinal stenosis, lumbar region without neurogenic claudication: Secondary | ICD-10-CM

## 2024-05-30 DIAGNOSIS — M4316 Spondylolisthesis, lumbar region: Secondary | ICD-10-CM

## 2024-05-30 DIAGNOSIS — M47816 Spondylosis without myelopathy or radiculopathy, lumbar region: Secondary | ICD-10-CM

## 2024-05-30 DIAGNOSIS — M5416 Radiculopathy, lumbar region: Secondary | ICD-10-CM

## 2024-05-31 ENCOUNTER — Ambulatory Visit

## 2024-05-31 DIAGNOSIS — M5416 Radiculopathy, lumbar region: Secondary | ICD-10-CM

## 2024-05-31 DIAGNOSIS — M47816 Spondylosis without myelopathy or radiculopathy, lumbar region: Secondary | ICD-10-CM

## 2024-05-31 DIAGNOSIS — M48061 Spinal stenosis, lumbar region without neurogenic claudication: Secondary | ICD-10-CM

## 2024-05-31 DIAGNOSIS — M4316 Spondylolisthesis, lumbar region: Secondary | ICD-10-CM

## 2024-05-31 NOTE — Progress Notes (Signed)
 [facility] Established Patient Psychotherapy   Name: Brittany Cobb Date: 05/31/2024 MRN: [id number] DOB: [birth year]  Start Time: 10:00 AM  Stop Time: 10:57 AM  Service Duration: 57 minutes       Service: Outpatient Therapy- Individual   [x]  Face to face     Date of Last Encounter: 04/26/2024   Mental Status/Behavioral Observations Affect:  Mood congruent, intensity within normal limits, full range, responsive  Mood:   Stable, anxious   Thought Process:  Goal directed and linear   Behavior:   Calm, cooperative, direct eye contact and polite  Self Harm: Passive SI with no intent or plan    Purpose of contact:    [x]   Continue to address treatment goals  []   Treatment Planning/Treatment progress review []  Discharge Planning    Interventions Provided:    []   CBT  []   Interpersonal Process Therapy []  Acceptance & Commitment Therapy (ACT)  []  DBT  []  Motivational Interviewing  []  Behavioral Activation [x]  Psycho-Education  []  Exposure Therapy  []  Trauma-Informed CBT [x]  Person Centered  [x]  Supportive Therapy [x]  Advocacy    HPI: The patient is a 53 year old female with a documented psychiatric history of PTSD, anxiety, depression, and bipolar disorder. 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    Target Outcomes (next due 07/15/24): Reduce frequency, intensity, and duration of anxious symptoms. Enhance ability to manage emotional and behavioral response to triggers.   Patient Response/Progress: The clinician met with the patient for individual therapy in person. The patient reported a recent period of increased anxiety marked by fear of being alone. She described heightened emotional sensitivity and paranoia during this time. The patient was unable to be home alone for a period, but stated she can now do  so following a recent medication adjustment, and reported decreased anxiety overall. She stated I am pushing myself through [facility] of being alone]. The patient also reported having to drop out of community college class, identifying anxiety as a contributing factor to academic difficulties. The clinician provided psycho education regarding the anxiety cycle, emphasizing that facing feared situations helps to decrease anxiety whereas avoidance increased anxiety. The clinician facilitated exploration of the impact of anxiety on patient's academic functioning. The clinician suggested that the patient apply for academic accommodations. Established a plan to collaboratively complete the application next session, with the clinician agreeing to provide necessary supporting documentation. Drawing from person-centered therapy, the clinician used open-ended questions, minimal encouragements, and reflections throughout the session. The patient was engaged and receptive to psycho education, demonstrating growing awareness of the relationship between anxiety and avoidance. She reported a reduction in anxiety overall and improvement in her ability to be home alone. The patient expressed strong interest in pursuing academic accommodations .The clinician summarized the session and scheduled follow-up appointment. Continue plan.   RISK ASSESSMENT: A suicide and violence risk assessment was performed as part of this evaluation. The patient is deemed to be at chronic elevated risk for self-harm/suicide given the following factors: recent loss, current diagnosis of depression, agitation, childhood abuse, and past substance abuse. There patient is deemed to be at chronic elevated risk for violence given the following factors: recent loss, childhood abuse, and past substance abuse. These risk factors are mitigated by the following factors:lack of active SI/HI, no know access to weapons or  firearms, no history of previous suicide  attempts , motivation for treatment, and presence of an available support system. There is no acute risk for suicide or violence at this time. The patient was educated about relevant modifiable risk factors including following recommendations for treatment of psychiatric illness and abstaining from substance abuse.   While future psychiatric events cannot be accurately predicted, the patient does not currently require acute inpatient psychiatric care and does not currently meet [place]  involuntary commitment criteria.    Diagnoses: generalized anxiety disorder (GAD) and post-traumatic stress disorder (PTSD)  Sheryle JAYSON Earthly, [facility]  05/31/2024 at 12:20 PM

## 2024-06-06 NOTE — Progress Notes (Unsigned)
 My Chart Video Visit- Progress Note: Referring Physician:  Towana Pride, DO [address] [place],  [place] [address]  Primary Physician:  Towana Pride, DO  This visit was performed via MyChart/video.   Patient location: home Provider location: office  I spent a total of 20 minutes non-face-to-face activities for this visit on the date of this encounter including review of current clinical condition and response to treatment.    Patient has given verbal consent to this MyChart video visit and we reviewed the limitations of a MyChart video visit. Patient wishes to proceed.    Chief Complaint:  review imaging  History of Present Illness: Brittany Cobb is a 53 y.o. female has a history of  GERD, HTN, obesity, bipolar, migraines, mixed restrictive/obstructive lung disease.    Last seen by me on 04/04/24- she has known L1 compression fracture s/p fall on 08/12/22. She had L1 kyphoplasty by Dr. Gar on 08/20/22. She also has slip L4-L5 with facet hypertrophy and severe central stenosis with moderate/severe bilateral foraminal stenosis.    She had been working on weight loss with a goal of 250-260lbs.   MyChart visit scheduled to review her imaging.   PT was ordered and she is here for follow up.    She continues with constant LBP along with constant bilateral leg pain posterior to her feet. She has notices some new right sided LBP in last few days a well. This is worse with prolonged standing and walking. She has weakness in her legs. Still has numbness and tingling in her legs as well.    She has been working on weight loss. Insurance denied her wegovy and she gained some weight. She is up to 250 lbs.    She has known OA in both knees and was told she would need    She quit smoking in 2020.    Bowel/Bladder Dysfunction: some urinary urgency, no issues with bowels. No perineal numbness.    Conservative measures:  Physical therapy: PT at [facility] in [place] from  03/29/24-05/16/24 Multimodal medical therapy including regular antiinflammatories: tylenol , norco, zanaflex , neurontin  Injections:  bilateral L5-S1 TF ESI on 07/09/23 bilateral L5-S1 TF ESI on 01/22/23 with no improvement   Past Surgery:  L1 kyphoplasty by Dr. Gar on 08/20/22   The symptoms are causing a significant impact on the patient's life.     Exam: General: Patient is well developed, well nourished, calm, collected, and in no apparent distress. Attention to examination is appropriate.  Respiratory: Patient is breathing without any difficulty.    Awake, alert, oriented to person, place, and time.  Speech is clear and fluent. Fund of knowledge is appropriate.     Imaging: Lumbar MRI dated 05/26/24:  FINDINGS: Segmentation:  Standard.   Alignment:  Grade 1 anterolisthesis of L4 on L5.   Vertebrae: Chronic mild L1 superior endplate compression fracture status post cement augmentation. Remaining vertebral body heights are maintained. No acute fractures. No evidence of discitis. No marrow replacing bone lesion.   Conus medullaris and cauda equina: Conus extends to the L1-2 level. Conus and cauda equina appear normal.   Paraspinal and other soft tissues: Negative.   Disc levels:   T12-L1: Minimal disc bulge.  No foraminal or canal stenosis.   L1-L2: Mild facet hypertrophy without foraminal or canal stenosis.   L2-L3: Mild facet hypertrophy without foraminal or canal stenosis.   L3-L4: Mild annular disc bulge and mild facet hypertrophy. No canal stenosis. No significant foraminal stenosis.   L4-L5:  Advanced bilateral facet arthropathy with ligamentum flavum buckling. Disc uncovering with mild annular disc bulge. Mild right subarticular recess stenosis without significant canal stenosis. No foraminal stenosis.   L5-S1: No disc protrusion. Moderate facet arthropathy. No significant foraminal or canal stenosis.   IMPRESSION: 1. Multilevel lumbar spondylosis most  pronounced at L4-5 where there is advanced bilateral facet arthropathy and grade 1 anterolisthesis. Mild right subarticular recess stenosis at this level. 2. No significant foraminal or canal stenosis at any level. 3. Chronic mild L1 superior endplate compression fracture status post cement augmentation.     Electronically Signed   By: Mabel Converse D.O.   On: 05/30/2024 15:17   Lumbar xrays dated 05/31/24:  FINDINGS: Mild L1 compression fracture post kyphoplasty unchanged. Subtle grade 1 anterolisthesis of L4 on L5 without significant change likely due to the moderate facet arthropathy. Mild spondylosis throughout the lumbar spine. Minimal disc space narrowing at the L4-5 level and to lesser extent at the L3-4 level. No significant instability on flexion and extension. Remainder the exam is unchanged.   IMPRESSION: 1. No acute findings. 2. Mild spondylosis of the lumbar spine with disc disease at the L3-4 and L4-5 levels. 3. Subtle grade 1 anterolisthesis of L4 on L5 without significant instability on flexion and extension.     Electronically Signed   By: Toribio Agreste M.D.   On: 06/06/2024 16:04  I have personally reviewed the images and agree with the above interpretation.  I think she still has mild central stenosis at L4-L5.   Assessment and Plan: [name] continues with constant LBP along with constant bilateral leg pain posterior to her feet. She has notices some new right sided LBP in last few days a well. She has weakness in her legs. Still has numbness and tingling in her legs as well.   She has history of L1 compression fracture s/p fall on 08/12/22. She had L1 kyphoplasty by Dr. Gar on 08/20/22.    She also has known slip L4-L5 with facet hypertrophy and mild central stenosis with very mild bilateral foraminal stenosis.   We discussed that her above MRI findings have improved significantly from her previous CT on 08/14/22.   She has lost a significant  amount of weight- was down to 230s and is back to 250 after insurance changed her medication. No improvement with PT.    Treatment options discussed with patient and following plan made:   - Follow up with Dr. Clois to discuss further options. We did discuss that based on her new imaging, she may not be a surgical candidate. May consider SCS?  - She will continue working on her weight loss.    Glade Boys PA-C Neurosurgery

## 2024-06-08 ENCOUNTER — Telehealth: Admitting: Orthopedic Surgery

## 2024-06-08 DIAGNOSIS — W19XXXD Unspecified fall, subsequent encounter: Secondary | ICD-10-CM | POA: Diagnosis not present

## 2024-06-08 DIAGNOSIS — M47816 Spondylosis without myelopathy or radiculopathy, lumbar region: Secondary | ICD-10-CM

## 2024-06-08 DIAGNOSIS — S32010D Wedge compression fracture of first lumbar vertebra, subsequent encounter for fracture with routine healing: Secondary | ICD-10-CM

## 2024-06-08 DIAGNOSIS — M5126 Other intervertebral disc displacement, lumbar region: Secondary | ICD-10-CM | POA: Diagnosis not present

## 2024-06-08 DIAGNOSIS — M5416 Radiculopathy, lumbar region: Secondary | ICD-10-CM

## 2024-06-08 DIAGNOSIS — M4726 Other spondylosis with radiculopathy, lumbar region: Secondary | ICD-10-CM

## 2024-06-08 DIAGNOSIS — S32010S Wedge compression fracture of first lumbar vertebra, sequela: Secondary | ICD-10-CM

## 2024-06-08 DIAGNOSIS — M4316 Spondylolisthesis, lumbar region: Secondary | ICD-10-CM

## 2024-06-08 DIAGNOSIS — M48061 Spinal stenosis, lumbar region without neurogenic claudication: Secondary | ICD-10-CM

## 2024-06-20 NOTE — Progress Notes (Signed)
 "   Referring Physician:  Towana Pride, DO [address] [place],  [place] [address]  Primary Physician:  Towana Pride, DO  History of Present Illness: 06/29/2024 Brittany Cobb is here today with a chief complaint of chronic low back pain, lumbar spondylolisthesis, and prior L1 vertebral fracture who presents for evaluation of worsening lumbar symptoms.  She reports several years of low back pain, markedly worse since an L1 vertebral fracture about two years ago. Pain is localized to the lower lumbar spine with radiation into the leg, described as constant pressure with sharp severe pain when lying on her sides, so she can only sleep on her back. Pain is aggravated by bending, prolonged sitting, and walking, with a walking tolerance of about fifteen minutes before she must rest, and her back sometimes freezes up after sitting, requiring time to stand upright.  She has intermittent numbness and paresthesias in her legs and denies diabetes.  She completed physical therapy without significant benefit. Bilateral spinal injections gave less than two weeks of relief. She also received steroid injections for hip bursitis. She currently uses ibuprofen  for pain and has tried narcotic analgesics in the past without meaningful improvement. She is not using duloxetine.   Discussed the use of AI scribe software for clinical note transcription with the patient, who gave verbal consent to proceed.  Has participated in 11 visits at [facility] from 03/29/24 to 05/16/24 with no relief   Andree LITTIE Rogue has no symptoms of cervical myelopathy.  The symptoms are causing a significant impact on the patient's life.   I have utilized the care everywhere function in epic to review the outside records available from external health systems.  Progress Note from Glade Boys, [place] on 06/08/24:  History of Present Illness: Brittany Cobb is a 53 y.o. female has a history of  GERD, HTN, obesity, bipolar, migraines, mixed  restrictive/obstructive lung disease.    Last seen by me on 04/04/24- she has known L1 compression fracture s/p fall on 08/12/22. She had L1 kyphoplasty by Dr. Gar on 08/20/22. She also has slip L4-L5 with facet hypertrophy and severe central stenosis with moderate/severe bilateral foraminal stenosis.    She had been working on weight loss with a goal of 250-260lbs.    MyChart visit scheduled to review her imaging.    PT was ordered and she is here for follow up.    She continues with constant LBP along with constant bilateral leg pain posterior to her feet. She has notices some new right sided LBP in last few days a well. This is worse with prolonged standing and walking. She has weakness in her legs. Still has numbness and tingling in her legs as well.    She has been working on weight loss. Insurance denied her wegovy and she gained some weight. She is up to 250 lbs.    She has known OA in both knees and was told she would need    She quit smoking in 2020.    Bowel/Bladder Dysfunction: some urinary urgency, no issues with bowels. No perineal numbness.    Conservative measures:  Physical therapy: PT at [facility] in [place] from 03/29/24-05/16/24 Multimodal medical therapy including regular antiinflammatories: tylenol , norco, zanaflex , neurontin  Injections:  bilateral L5-S1 TF ESI on 07/09/23 bilateral L5-S1 TF ESI on 01/22/23 with no improvement   Past Surgery:  L1 kyphoplasty by Dr. Gar on 08/20/22  Review of Systems:  A 10 point review of systems is negative, except for the pertinent  positives and negatives detailed in the HPI.  Past Medical History: Past Medical History:  Diagnosis Date   Acid reflux    Anxiety    Depression     Past Surgical History: Past Surgical History:  Procedure Laterality Date   BREAST BIOPSY Left 2014   benign with s shaped clip   ENDOMETRIAL ABLATION     IR KYPHO LUMBAR INC FX REDUCE BONE BX UNI/BIL CANNULATION INC/IMAGING  08/20/2022   KNEE  SURGERY Left    TONSILLECTOMY     TUBAL LIGATION      Allergies: Allergies as of 06/29/2024 - Review Complete 06/29/2024  Allergen Reaction Noted   Bactrim [sulfamethoxazole-trimethoprim] Hives 03/26/2015    Medications: Current Medications[1]  Social History: Social History[2]  Family Medical History: History reviewed. No pertinent family history.  Physical Examination: Vitals:   06/29/24 1010  BP: 134/82    General: Patient is in no apparent distress. Attention to examination is appropriate.  Neck:   Supple.  Full range of motion.  Respiratory: Patient is breathing without any difficulty.   NEUROLOGICAL:     Awake, alert, oriented to person, place, and time.  Speech is clear and fluent.   Cranial Nerves: Pupils equal round and reactive to light.  Facial tone is symmetric.  Facial sensation is symmetric. Shoulder shrug is symmetric. Tongue protrusion is midline.  There is no pronator drift.  Strength: Side Biceps Triceps Deltoid Interossei Grip Wrist Ext. Wrist Flex.  R 5 5 5 5 5 5 5   L 5 5 5 5 5 5 5    Side Iliopsoas Quads Hamstring PF DF EHL  R 5 5 5 5 5 5   L 5 5 5 5 5 5    Reflexes are 1+ and symmetric at the biceps, triceps, brachioradialis, patella and achilles.   Hoffman's is absent.   Bilateral upper and lower extremity sensation is intact to light touch.    No evidence of dysmetria noted.  Gait is normal.     Medical Decision Making  Imaging: MRI L spine 05/26/2024 IMPRESSION: 1. Multilevel lumbar spondylosis most pronounced at L4-5 where there is advanced bilateral facet arthropathy and grade 1 anterolisthesis. Mild right subarticular recess stenosis at this level. 2. No significant foraminal or canal stenosis at any level. 3. Chronic mild L1 superior endplate compression fracture status post cement augmentation.     Electronically Signed   By: Mabel Converse D.O.   On: 05/30/2024 15:17  L spine xrays 05/31/2024 IMPRESSION: 1. No  acute findings. 2. Mild spondylosis of the lumbar spine with disc disease at the L3-4 and L4-5 levels. 3. Subtle grade 1 anterolisthesis of L4 on L5 without significant instability on flexion and extension.     Electronically Signed   By: Toribio Agreste M.D.   On: 06/06/2024 16:04  Please note that there is 4 mm of anterolisthesis on lateral xrays  I have personally reviewed the images and agree with the above interpretation.  Assessment and Plan: Brittany Cobb is a pleasant 53 y.o. female with back pain without sciatica.  She also has anterolisthesis of L4 and L5.  This is developed over time.  She has failed conservative management.  She also has history of an L1 compression fracture.  I will recommend a DEXA study.  If her bone density is appropriate, she may be a candidate for an L4-5 fusion.  This would be designed to address her L4-5 anterolisthesis which is causing back pain.  She has tried and failed conservative management.  No further conservative management is indicated.   I discussed the planned procedure at length with the patient, including the risks, benefits, alternatives, and indications. The risks discussed include but are not limited to bleeding, infection, need for reoperation, spinal fluid leak, stroke, vision loss, anesthetic complication, coma, paralysis, and even death. I also described the possibility of psoas weakness and paresthesias. I described in detail that improvement was not guaranteed.  The patient expressed understanding of these risks, and asked that we proceed with surgery. I described the surgery in layman's terms, and gave ample opportunity for questions, which were answered to the best of my ability.   Will defer planning until her DEXA is performed.  I spent a total of 30 minutes in this patient's care today. This time was spent reviewing pertinent records including imaging studies, obtaining and confirming history, performing a directed evaluation,  formulating and discussing my recommendations, and documenting the visit within the medical record.       Thank you for involving me in the care of this patient.      Kalei Meda K. Clois MD, [facility] Neurosurgery     [1]  Current Outpatient Medications:    atorvastatin (LIPITOR) 20 MG tablet, Take 20 mg by mouth daily., Disp: , Rfl:    budesonide-formoterol  (SYMBICORT) 160-4.5 MCG/ACT inhaler, Inhale 2 puffs into the lungs 2 (two) times daily., Disp: , Rfl:    DULoxetine HCl 40 MG CPEP, Take 1 capsule by mouth daily., Disp: , Rfl:    furosemide  (LASIX ) 40 MG tablet, Take 1 tablet (40 mg total) by mouth daily., Disp: 30 tablet, Rfl: 2   HYDROcodone -acetaminophen  (NORCO) 10-325 MG tablet, Take 1 tablet by mouth., Disp: , Rfl:    hydrOXYzine (ATARAX) 25 MG tablet, Take 25 mg by mouth., Disp: , Rfl:    lamoTRIgine  (LAMICTAL ) 200 MG tablet, Take 100 mg by mouth., Disp: , Rfl:    montelukast (SINGULAIR) 10 MG tablet, SINGULAIR 10 MG TABS, Disp: , Rfl:    omeprazole (PRILOSEC) 20 MG capsule, Take 20 mg by mouth in the morning and at bedtime., Disp: , Rfl:    potassium chloride  SA (KLOR-CON  M) 20 MEQ tablet, Take 1 tablet (20 mEq total) by mouth daily., Disp: 30 tablet, Rfl: 2   promethazine (PHENERGAN) 25 MG tablet, Take 25 mg by mouth every 6 (six) hours as needed for nausea or vomiting., Disp: , Rfl:    tirzepatide (ZEPBOUND) 7.5 MG/0.5ML Pen, Inject 7.5 mg into the skin., Disp: , Rfl:    traZODone  (DESYREL ) 50 MG tablet, Take 50 mg by mouth at bedtime., Disp: , Rfl:    valACYclovir  (VALTREX ) 1000 MG tablet, Take 1,000 mg by mouth daily., Disp: , Rfl:    VENTOLIN  HFA 108 (90 Base) MCG/ACT inhaler, , Disp: , Rfl:  [2]  Social History Tobacco Use   Smoking status: Former    Current packs/day: 0.00    Types: Cigarettes    Quit date: 09/2018    Years since quitting: 5.7   Smokeless tobacco: Never  Substance Use Topics   Alcohol use: No   Drug use: No   "

## 2024-06-29 ENCOUNTER — Encounter: Payer: Self-pay | Admitting: Neurosurgery

## 2024-06-29 ENCOUNTER — Ambulatory Visit (INDEPENDENT_AMBULATORY_CARE_PROVIDER_SITE_OTHER): Admitting: Neurosurgery

## 2024-06-29 VITALS — BP 134/82 | Ht 67.0 in | Wt 249.0 lb

## 2024-06-29 DIAGNOSIS — W19XXXD Unspecified fall, subsequent encounter: Secondary | ICD-10-CM

## 2024-06-29 DIAGNOSIS — S32010D Wedge compression fracture of first lumbar vertebra, subsequent encounter for fracture with routine healing: Secondary | ICD-10-CM

## 2024-06-29 DIAGNOSIS — E2839 Other primary ovarian failure: Secondary | ICD-10-CM

## 2024-06-29 DIAGNOSIS — G8929 Other chronic pain: Secondary | ICD-10-CM | POA: Diagnosis not present

## 2024-06-29 DIAGNOSIS — M545 Low back pain, unspecified: Secondary | ICD-10-CM

## 2024-06-29 DIAGNOSIS — M4316 Spondylolisthesis, lumbar region: Secondary | ICD-10-CM | POA: Diagnosis not present

## 2024-06-29 DIAGNOSIS — S32010S Wedge compression fracture of first lumbar vertebra, sequela: Secondary | ICD-10-CM

## 2024-07-18 ENCOUNTER — Other Ambulatory Visit: Payer: Self-pay | Admitting: Medical Genetics

## 2024-07-18 ENCOUNTER — Ambulatory Visit
Admission: RE | Admit: 2024-07-18 | Discharge: 2024-07-18 | Disposition: A | Discharge status: Home / Self Care | Source: Ambulatory Visit | Attending: Neurosurgery | Admitting: Neurosurgery

## 2024-07-18 DIAGNOSIS — S32010S Wedge compression fracture of first lumbar vertebra, sequela: Secondary | ICD-10-CM | POA: Diagnosis present

## 2024-07-18 DIAGNOSIS — E2839 Other primary ovarian failure: Secondary | ICD-10-CM | POA: Diagnosis present

## 2024-07-18 NOTE — Telephone Encounter (Signed)
 I spoke to patient and she states that she ate at [employer] and when she got to her car she fell beside the car on her left hip and back. She states she is able to walk but her left hip hurts. I told her it may be best to go to the ER to be evaluated and they can get any images necessary. She said she will let us  know how she is doing. She had a Dexa scan done today.

## 2024-07-19 ENCOUNTER — Other Ambulatory Visit

## 2024-07-19 ENCOUNTER — Encounter: Payer: Self-pay | Admitting: Neurosurgery

## 2024-07-27 ENCOUNTER — Inpatient Hospital Stay
Admission: RE | Admit: 2024-07-27 | Discharge: 2024-07-27 | Payer: Self-pay | Attending: Medical Genetics | Admitting: Medical Genetics

## 2024-07-28 ENCOUNTER — Ambulatory Visit: Admitting: Neurosurgery

## 2024-07-28 ENCOUNTER — Encounter: Payer: Self-pay | Admitting: Neurosurgery

## 2024-07-28 ENCOUNTER — Other Ambulatory Visit: Payer: Self-pay

## 2024-07-28 VITALS — BP 130/80 | Ht 68.0 in | Wt 254.6 lb

## 2024-07-28 DIAGNOSIS — G8929 Other chronic pain: Secondary | ICD-10-CM

## 2024-07-28 DIAGNOSIS — Z01818 Encounter for other preprocedural examination: Secondary | ICD-10-CM

## 2024-07-28 DIAGNOSIS — M4316 Spondylolisthesis, lumbar region: Secondary | ICD-10-CM

## 2024-07-28 NOTE — Progress Notes (Signed)
 "   Referring Physician:  Towana Pride, DO [address] [place],  [place] [address]  Primary Physician:  Towana Pride, DO  History of Present Illness: 07/28/2024 Brittany Cobb presents today with continued pain.  Her symptoms have not changed.  06/29/2024 Brittany Cobb is here today with a chief complaint of chronic low back pain, lumbar spondylolisthesis, and prior L1 vertebral fracture who presents for evaluation of worsening lumbar symptoms.  She reports several years of low back pain, markedly worse since an L1 vertebral fracture about two years ago. Pain is localized to the lower lumbar spine with radiation into the leg, described as constant pressure with sharp severe pain when lying on her sides, so she can only sleep on her back. Pain is aggravated by bending, prolonged sitting, and walking, with a walking tolerance of about fifteen minutes before she must rest, and her back sometimes freezes up after sitting, requiring time to stand upright.  She has intermittent numbness and paresthesias in her legs and denies diabetes.  She completed physical therapy without significant benefit. Bilateral spinal injections gave less than two weeks of relief. She also received steroid injections for hip bursitis. She currently uses ibuprofen  for pain and has tried narcotic analgesics in the past without meaningful improvement. She is not using duloxetine.   Discussed the use of AI scribe software for clinical note transcription with the patient, who gave verbal consent to proceed.  Has participated in 11 visits at [facility] from 03/29/24 to 05/16/24 with no relief   Brittany Cobb has no symptoms of cervical myelopathy.  The symptoms are causing a significant impact on the patient's life.   I have utilized the care everywhere function in epic to review the outside records available from external health systems.  Progress Note from Glade Boys, [place] on 06/08/24:  History of Present Illness: Brittany Cobb is  a 54 y.o. female has a history of  GERD, HTN, obesity, bipolar, migraines, mixed restrictive/obstructive lung disease.    Last seen by me on 04/04/24- she has known L1 compression fracture s/p fall on 08/12/22. She had L1 kyphoplasty by Dr. Gar on 08/20/22. She also has slip L4-L5 with facet hypertrophy and severe central stenosis with moderate/severe bilateral foraminal stenosis.    She had been working on weight loss with a goal of 250-260lbs.    MyChart visit scheduled to review her imaging.    PT was ordered and she is here for follow up.    She continues with constant LBP along with constant bilateral leg pain posterior to her feet. She has notices some new right sided LBP in last few days a well. This is worse with prolonged standing and walking. She has weakness in her legs. Still has numbness and tingling in her legs as well.    She has been working on weight loss. Insurance denied her wegovy and she gained some weight. She is up to 250 lbs.    She has known OA in both knees and was told she would need    She quit smoking in 2020.    Bowel/Bladder Dysfunction: some urinary urgency, no issues with bowels. No perineal numbness.    Conservative measures:  Physical therapy: PT at [facility] in [place] from 03/29/24-05/16/24 Multimodal medical therapy including regular antiinflammatories: tylenol , norco, zanaflex , neurontin  Injections:  bilateral L5-S1 TF ESI on 07/09/23 bilateral L5-S1 TF ESI on 01/22/23 with no improvement   Past Surgery:  L1 kyphoplasty by Dr. Gar on 08/20/22  Review  of Systems:  A 10 point review of systems is negative, except for the pertinent positives and negatives detailed in the HPI.  Past Medical History: Past Medical History:  Diagnosis Date   Acid reflux    Anxiety    Depression     Past Surgical History: Past Surgical History:  Procedure Laterality Date   BREAST BIOPSY Left 2014   benign with s shaped clip   ENDOMETRIAL ABLATION     IR KYPHO  LUMBAR INC FX REDUCE BONE BX UNI/BIL CANNULATION INC/IMAGING  08/20/2022   KNEE SURGERY Left    TONSILLECTOMY     TUBAL LIGATION      Allergies: Allergies as of 07/28/2024 - Review Complete 06/29/2024  Allergen Reaction Noted   Bactrim [sulfamethoxazole-trimethoprim] Hives 03/26/2015    Medications: Current Medications[1]  Social History: Social History[2]  Family Medical History: No family history on file.  Physical Examination: There were no vitals filed for this visit.   General: Patient is in no apparent distress. Attention to examination is appropriate.  Neck:   Supple.  Full range of motion.  Respiratory: Patient is breathing without any difficulty.   NEUROLOGICAL:     Awake, alert, oriented to person, place, and time.  Speech is clear and fluent.   Cranial Nerves: Pupils equal round and reactive to light.  Facial tone is symmetric.  Facial sensation is symmetric. Shoulder shrug is symmetric. Tongue protrusion is midline.  There is no pronator drift.  Strength: Side Biceps Triceps Deltoid Interossei Grip Wrist Ext. Wrist Flex.  R 5 5 5 5 5 5 5   L 5 5 5 5 5 5 5    Side Iliopsoas Quads Hamstring PF DF EHL  R 5 5 5 5 5 5   L 5 5 5 5 5 5    Reflexes are 1+ and symmetric at the biceps, triceps, brachioradialis, patella and achilles.   Hoffman's is absent.   Bilateral upper and lower extremity sensation is intact to light touch.    No evidence of dysmetria noted.  Gait is normal.     Medical Decision Making  Imaging: MRI L spine 05/26/2024 IMPRESSION: 1. Multilevel lumbar spondylosis most pronounced at L4-5 where there is advanced bilateral facet arthropathy and grade 1 anterolisthesis. Mild right subarticular recess stenosis at this level. 2. No significant foraminal or canal stenosis at any level. 3. Chronic mild L1 superior endplate compression fracture status post cement augmentation.     Electronically Signed   By: Mabel Converse D.O.   On:  05/30/2024 15:17  L spine xrays 05/31/2024 IMPRESSION: 1. No acute findings. 2. Mild spondylosis of the lumbar spine with disc disease at the L3-4 and L4-5 levels. 3. Subtle grade 1 anterolisthesis of L4 on L5 without significant instability on flexion and extension.     Electronically Signed   By: Toribio Agreste M.D.   On: 06/06/2024 16:04  Please note that there is 4 mm of anterolisthesis on lateral xrays  I have personally reviewed the images and agree with the above interpretation.  Assessment and Plan: Brittany Cobb is a pleasant 54 y.o. female with back pain without sciatica.  She also has anterolisthesis of L4 and L5.  This has developed over time.  She has failed conservative management.  She also has history of an L1 compression fracture.  Her DEXA study reveals that she has appropriate bone density to consider surgical intervention.  This would be designed to address her L4-5 anterolisthesis which is causing back pain.  She has  tried and failed conservative management.  No further conservative management is indicated.  Her imaging findings have worsened over time.   I discussed the planned procedure at length with the patient, including the risks, benefits, alternatives, and indications. The risks discussed include but are not limited to bleeding, infection, need for reoperation, spinal fluid leak, stroke, vision loss, anesthetic complication, coma, paralysis, and even death. I also described the possibility of psoas weakness and paresthesias. I described in detail that improvement was not guaranteed.  The patient expressed understanding of these risks, and asked that we proceed with surgery. I described the surgery in layman's terms, and gave ample opportunity for questions, which were answered to the best of my ability.  I spent a total of 30 minutes in this patient's care today. This time was spent reviewing pertinent records including imaging studies, obtaining and confirming  history, performing a directed evaluation, formulating and discussing my recommendations, and documenting the visit within the medical record.       Thank you for involving me in the care of this patient.      Nykeria Mealing K. Clois MD, [facility] Neurosurgery     [1]  Current Outpatient Medications:    atorvastatin (LIPITOR) 20 MG tablet, Take 20 mg by mouth daily., Disp: , Rfl:    budesonide-formoterol  (SYMBICORT) 160-4.5 MCG/ACT inhaler, Inhale 2 puffs into the lungs 2 (two) times daily., Disp: , Rfl:    DULoxetine HCl 40 MG CPEP, Take 1 capsule by mouth daily., Disp: , Rfl:    furosemide  (LASIX ) 40 MG tablet, Take 1 tablet (40 mg total) by mouth daily., Disp: 30 tablet, Rfl: 2   hydrOXYzine (ATARAX) 25 MG tablet, Take 25 mg by mouth., Disp: , Rfl:    lamoTRIgine  (LAMICTAL ) 200 MG tablet, Take 100 mg by mouth., Disp: , Rfl:    montelukast (SINGULAIR) 10 MG tablet, SINGULAIR 10 MG TABS, Disp: , Rfl:    omeprazole (PRILOSEC) 20 MG capsule, Take 20 mg by mouth in the morning and at bedtime., Disp: , Rfl:    potassium chloride  SA (KLOR-CON  M) 20 MEQ tablet, Take 1 tablet (20 mEq total) by mouth daily., Disp: 30 tablet, Rfl: 2   promethazine (PHENERGAN) 25 MG tablet, Take 25 mg by mouth every 6 (six) hours as needed for nausea or vomiting., Disp: , Rfl:    tirzepatide (ZEPBOUND) 7.5 MG/0.5ML Pen, Inject 7.5 mg into the skin., Disp: , Rfl:    traZODone  (DESYREL ) 50 MG tablet, Take 50 mg by mouth at bedtime., Disp: , Rfl:    valACYclovir  (VALTREX ) 1000 MG tablet, Take 1,000 mg by mouth daily., Disp: , Rfl:    VENTOLIN  HFA 108 (90 Base) MCG/ACT inhaler, , Disp: , Rfl:  [2]  Social History Tobacco Use   Smoking status: Former    Current packs/day: 0.00    Types: Cigarettes    Quit date: 09/2018    Years since quitting: 5.8   Smokeless tobacco: Never  Substance Use Topics   Alcohol use: No   Drug use: No   "

## 2024-07-28 NOTE — Patient Instructions (Signed)
 Please see below for information in regards to your upcoming surgery:   Planned surgery: L4/5 lateral lumbar interbody fusion and posterior spinal fusion   Surgery date: 08/14/2024 at [facility] (Medical Mall: [address], [place], [place] [address]) - you will find out your arrival time the business day before your surgery.   Pre-op appointment at [facility] Pre-admit Testing: you will receive a call with a date/time for this appointment. If you are scheduled for an in person appointment, Pre-admit Testing is located on the first floor of the [facility] building, [address], [address]. During this appointment, they will advise you which medications you can take the morning of surgery, and which medications you will need to hold for surgery. Labs (such as blood work, EKG) may be done at your pre-op appointment. You are not required to fast for these labs. Should you need to change your pre-op appointment, please call Pre-admit testing at [phone].   Please bring your medication bottles or an up to date medication list to your pre-admit testing appointment (regardless of whether we have a list in your chart).      Diabetes/heart failure/kidney disease/weight loss medications that require an extended hold: Per anesthesia guidelines (due to the increased risk of aspiration caused by delayed gastric emptying):  Wegovy injections: hold for 7 days prior to surgery    Surgical clearance: we will send a clearance form to [place] Arts, DO. They may wish to see you in their office prior to signing the clearance form. If so, they may call you to schedule an appointment.    NSAIDS (Non-steroidal anti-inflammatory drugs): because you are having a fusion, please avoid taking any NSAIDS (examples: ibuprofen , motrin , aleve , naproxen , meloxicam, diclofenac ) for 3 months after surgery. Celebrex is an exception and is OK to take, if prescribed. Tylenol  is  not an NSAID.    Common restrictions after spine surgery: No bending, lifting, or twisting (BLT). Avoid lifting objects heavier than 10 pounds for the first 6 weeks after surgery. Where possible, avoid household activities that involve lifting, bending, reaching, pushing, or pulling such as laundry, vacuuming, grocery shopping, and childcare. Try to arrange for help from friends and family for these activities while you heal. Do not drive while taking prescription pain medication. Weeks 6 through 12 after surgery: avoid lifting more than 25 pounds.    X-rays after surgery: Because you are having a fusion: for appointments after your 2 week follow-up: please arrive our office 30 minutes prior to your appointment for x-rays. This applies to every appointment after your 2 week follow-up. Failure to do so may result in your appointment being rescheduled.    How to contact us :  If you have any questions/concerns before or after surgery, you can reach us  at [phone], or you can send a mychart message. We can be reached by phone or mychart 8am-4pm, Monday-Friday.  *Please note: Calls after 4pm are forwarded to a third party answering service. Mychart messages are not routinely monitored during evenings, weekends, and holidays. Please call our office to contact the answering service for urgent concerns during non-business hours.   If you have FMLA/disability paperwork, please drop it off or fax it to [phone]   Appointments/FMLA & disability paperwork: Reche Hait, & Nichole Registered Nurse/Surgery scheduler: Francies Inch, RN & Katie, RN Certified Medical Assistants: Don, CMA, Elenor, CMA, Damien, CMA, & Auston, [place] Physician Assistants: Lyle Decamp, PA-C, Edsel Goods, PA-C & Glade Boys, PA-C Surgeons: Penne Sharps,  MD & Reeves Daisy, MD   [facility] PREADMIT TESTING VISIT and SURGERY INFORMATION SHEET   Now that surgery has been scheduled you can  anticipate several phone calls from [facility] services. A pharmacy technician will call you to verify your current list of medications taken at home.               The Pre-Service Center will call to verify your insurance information and to give you billing estimates and information.             The Preadmit Testing Office will be calling to schedule a visit to obtain information for the anesthesia team and provide instructions on preparation for surgery.  What can you expect for the Preadmit Testing Visit: Appointments may be scheduled in-person or by telephone.  If a telephone visit is scheduled, you may be asked to come into the office to have lab tests or other studies performed.   This visit will not be completed any greater than 14 days prior to your surgery.  If your surgery has been scheduled for a future date, please do not be alarmed if we have not contacted you to schedule an appointment more than a month prior to the surgery date.    Please be prepared to provide the following information during this appointment:            -Personal medical history                                               -Medication and allergy list            -Any history of problems with anesthesia              -Recent lab work or diagnostic studies            -Please notify us  of any needs we should be aware of to provide the best care possible           -You will be provided with instructions on how to prepare for your surgery.    On The Day of Surgery:  You must have a driver to take you home after surgery, you will be asked not to drive for 24 hours following surgery.  Taxi, Gisele and non-medical transport will not be acceptable means of transportation unless you have a responsible individual who will be traveling with you.  Visitors in the surgical area:   2 people will be able to visit you in your room once your preparation for surgery has been completed. During surgery, your visitors will be asked  to wait in the Surgery Waiting Area.  It is not a requirement for them to stay, if they prefer to leave and come back.  Your visitor(s) will be given an update once the surgery has been completed.  No visitors are allowed in the initial recovery room to respect patient privacy and safety.  Once you are more awake and transfer to the secondary recovery area, or are transferred to an inpatient room, visitors will again be able to see you.  To respect and protect your privacy: We will ask on the day of surgery who your driver will be and what the contact number for that individual will be. We will ask if it is okay to share information with this individual, or  if there is an alternative individual that we, or the surgeon, should contact to provide updates and information. If family or friends come to the surgical information desk requesting information about you, who you have not listed with us , no information will be given.   It may be helpful to designate someone as the main contact who will be responsible for updating your other friends and family.    PREADMIT TESTING OFFICE: [phone] SAME DAY SURGERY: [phone] We look forward to caring for you before and throughout the process of your surgery.

## 2024-08-01 ENCOUNTER — Inpatient Hospital Stay: Admission: RE | Admit: 2024-08-01 | Source: Ambulatory Visit

## 2024-08-14 ENCOUNTER — Ambulatory Visit: Admit: 2024-08-14 | Admitting: Neurosurgery

## 2024-08-17 ENCOUNTER — Ambulatory Visit

## 2024-08-28 ENCOUNTER — Encounter: Admitting: Orthopedic Surgery

## 2024-09-28 ENCOUNTER — Encounter

## 2024-11-07 ENCOUNTER — Encounter: Admitting: Orthopedic Surgery

## 2024-11-07 ENCOUNTER — Other Ambulatory Visit
